# Patient Record
Sex: Female | Born: 1990 | Race: Black or African American | Hispanic: No | Smoking: Current every day smoker
Health system: Southern US, Community
[De-identification: ages and names within clinical notes are randomized; demographics above are authoritative.]

## PROBLEM LIST (undated history)

## (undated) ENCOUNTER — Inpatient Hospital Stay (HOSPITAL_COMMUNITY): Payer: Self-pay

## (undated) DIAGNOSIS — G629 Polyneuropathy, unspecified: Secondary | ICD-10-CM

## (undated) DIAGNOSIS — I10 Essential (primary) hypertension: Secondary | ICD-10-CM

## (undated) DIAGNOSIS — N6009 Solitary cyst of unspecified breast: Secondary | ICD-10-CM

---
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
## 2011-06-29 LAB — RAPID STREP SCREEN (MED CTR MEBANE ONLY): Streptococcus, Group A Screen (Direct): NEGATIVE

## 2011-07-18 ENCOUNTER — Emergency Department (HOSPITAL_COMMUNITY)
Admission: EM | Admit: 2011-07-18 | Discharge: 2011-07-18 | Disposition: A | Discharge status: Home / Self Care | Payer: No Typology Code available for payment source | Attending: Emergency Medicine | Admitting: Emergency Medicine

## 2011-07-18 DIAGNOSIS — T1490XA Injury, unspecified, initial encounter: Secondary | ICD-10-CM | POA: Insufficient documentation

## 2011-07-18 DIAGNOSIS — Y92410 Unspecified street and highway as the place of occurrence of the external cause: Secondary | ICD-10-CM | POA: Insufficient documentation

## 2012-07-22 ENCOUNTER — Emergency Department (HOSPITAL_COMMUNITY): Payer: Self-pay

## 2012-07-22 ENCOUNTER — Emergency Department (HOSPITAL_COMMUNITY)
Admission: EM | Admit: 2012-07-22 | Discharge: 2012-07-22 | Disposition: A | Discharge status: Home / Self Care | Payer: Self-pay | Attending: Emergency Medicine | Admitting: Emergency Medicine

## 2012-07-22 ENCOUNTER — Encounter (HOSPITAL_COMMUNITY): Payer: Self-pay | Admitting: *Deleted

## 2012-07-22 DIAGNOSIS — F17200 Nicotine dependence, unspecified, uncomplicated: Secondary | ICD-10-CM | POA: Insufficient documentation

## 2012-07-22 DIAGNOSIS — J209 Acute bronchitis, unspecified: Secondary | ICD-10-CM | POA: Insufficient documentation

## 2012-07-22 MED ORDER — GUAIFENESIN-CODEINE 100-10 MG/5ML PO SYRP: ORAL_SOLUTION | ORAL | Status: DC

## 2012-07-22 MED ORDER — AZITHROMYCIN 250 MG PO TABS: ORAL_TABLET | ORAL | Status: DC

## 2012-07-22 NOTE — ED Notes (Signed)
Pt is here with hard to breath and states she is coughing but nonproductive.  Pt states breathing is not regular and reports some chest pain

## 2012-07-22 NOTE — ED Provider Notes (Signed)
History  Scribed for Carleene Cooper III, MD, the patient was seen in room TR06C/TR06C. This chart was scribed by Candelaria Stagers. The patient's care started at 6:00 PM   CSN: [id number]  Arrival date & time 07/22/12  1514   First MD Initiated Contact with Patient 07/22/12 1757      Chief Complaint  Patient presents with  . Shortness of Breath     The history is provided by the patient. No language interpreter was used.   Kayla Richard is a 21 y.o. female who presents to the Emergency Department complaining of SOB that started about two days ago.  She is also experiencing a nonproductive cough.  Cough is worse at night.  She denies fever, vomiting, diarrhea.  Nothing seems to make the sx better or worse.  Pt smokes.  History reviewed. No pertinent past medical history.  History reviewed. No pertinent past surgical history.  No family history on file.  History  Substance Use Topics  . Smoking status: Current Every Day Smoker  . Smokeless tobacco: Not on file  . Alcohol Use: No    OB History    Grav Para Term Preterm Abortions TAB SAB Ect Mult Living                  Review of Systems  Constitutional: Negative for fever.  HENT: Negative for sore throat.   Respiratory: Positive for cough and shortness of breath.   Gastrointestinal: Negative for nausea, vomiting and diarrhea.  Genitourinary: Negative for difficulty urinating.  Skin: Negative for rash.  Neurological: Negative for syncope.  All other systems reviewed and are negative.    Allergies  Review of patient's allergies indicates no known allergies.  Home Medications  No current outpatient prescriptions on file.  BP 161/92  Pulse 84  Temp 98.4 F (36.9 C) (Oral)  Resp 20  SpO2 99%  LMP 07/20/2012  Physical Exam  Nursing note and vitals reviewed. Constitutional: She is oriented to person, place, and time. She appears well-developed and well-nourished. No distress.  HENT:  Head: Normocephalic and  atraumatic.  Eyes: EOM are normal. Pupils are equal, round, and reactive to light.  Neck: Neck supple. No thyromegaly present.  Cardiovascular: Normal rate.   Pulmonary/Chest: Effort normal. She has no wheezes. She has no rales.  Abdominal: Soft. She exhibits no distension. There is no tenderness.  Musculoskeletal: Normal range of motion. She exhibits no tenderness.  Lymphadenopathy:    She has no cervical adenopathy.  Neurological: She is alert and oriented to person, place, and time. She has normal strength. No sensory deficit. Cranial nerve deficit:  no gross defecits noted. She displays no seizure activity.  Skin: Skin is warm and dry.  Psychiatric: She has a normal mood and affect. Her behavior is normal.    ED Course  Procedures   DIAGNOSTIC STUDIES: Oxygen Saturation is 99% on room air, normal by my interpretation.    COORDINATION OF CARE:   6:04 PM Will treat for bronchitis with antibiotic and cough medicine.  Pt understands and agrees.   Labs Reviewed - No data to display Dg Chest 2 View  07/22/2012  *RADIOLOGY REPORT*  Clinical Data: Chest pain, shortness of breath  CHEST - 2 VIEW  Comparison: 05/13/2007  Findings: Lungs are clear. No pleural effusion or pneumothorax.  Cardiomediastinal silhouette is within normal limits.  Visualized osseous structures are within normal limits.  IMPRESSION: No evidence of acute cardiopulmonary disease.   Original Report Authenticated By: Charline Bills,  M.D.      1. Acute bronchitis    Rx Z-Pak, Robitussin AC, advised not to smoke.   I personally performed the services described in this documentation, which was scribed in my presence. The recorded information has been reviewed and considered.  Osvaldo Human, MD      Carleene Cooper III, MD 07/22/12 [phone]

## 2012-10-14 ENCOUNTER — Encounter (HOSPITAL_COMMUNITY): Payer: Self-pay | Admitting: *Deleted

## 2012-10-14 ENCOUNTER — Emergency Department (HOSPITAL_COMMUNITY)
Admission: EM | Admit: 2012-10-14 | Discharge: 2012-10-14 | Disposition: A | Discharge status: Home / Self Care | Payer: Self-pay | Attending: Emergency Medicine | Admitting: Emergency Medicine

## 2012-10-14 DIAGNOSIS — L0291 Cutaneous abscess, unspecified: Secondary | ICD-10-CM

## 2012-10-14 DIAGNOSIS — N61 Mastitis without abscess: Secondary | ICD-10-CM | POA: Insufficient documentation

## 2012-10-14 DIAGNOSIS — F17200 Nicotine dependence, unspecified, uncomplicated: Secondary | ICD-10-CM | POA: Insufficient documentation

## 2012-10-14 HISTORY — DX: Solitary cyst of unspecified breast: N60.09

## 2012-10-14 MED ORDER — SULFAMETHOXAZOLE-TRIMETHOPRIM 800-160 MG PO TABS: 1.0000 | ORAL_TABLET | Freq: Two times a day (BID) | ORAL | Status: AC

## 2012-10-14 MED ORDER — OXYCODONE-ACETAMINOPHEN 5-325 MG PO TABS
2.0000 | ORAL_TABLET | Freq: Once | ORAL | Status: AC
  Administered 2012-10-14: 2 via ORAL
  Filled 2012-10-14: qty 2

## 2012-10-14 MED ORDER — HYDROCODONE-ACETAMINOPHEN 5-325 MG PO TABS: 2.0000 | ORAL_TABLET | ORAL | Status: DC | PRN

## 2012-10-14 NOTE — ED Notes (Signed)
Discharge instructions reviewed. Pt verbalized understanding.  
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
## 2012-10-14 NOTE — ED Provider Notes (Signed)
History     CSN: [id number]  Arrival date & time 10/14/12  1027   First MD Initiated Contact with Patient 10/14/12 1121      Chief Complaint  Patient presents with  . Recurrent Skin Infections    (Consider location/radiation/quality/duration/timing/severity/associated sxs/prior treatment) HPI Comments: Patient is a 22 year old female who presents with an abscess for the past 2 days. The abscess is located between her breasts that gradually started and progressively worsened since the onset. Patient reports associated tenderness to the area that does not radiate. Palpation of the area makes the pain worse. Nothing makes the pain better. Patient has no tried anything for symptoms. She reports associated redness to the area. Patient reports recurrent abscesses in this area.    Past Medical History  Diagnosis Date  . Cyst of breast     History reviewed. No pertinent past surgical history.  No family history on file.  History  Substance Use Topics  . Smoking status: Current Every Day Smoker  . Smokeless tobacco: Not on file  . Alcohol Use: No    OB History    Grav Para Term Preterm Abortions TAB SAB Ect Mult Living                  Review of Systems  Skin: Positive for wound.  All other systems reviewed and are negative.    Allergies  Review of patient's allergies indicates no known allergies.  Home Medications   Current Outpatient Rx  Name  Route  Sig  Dispense  Refill  . IBUPROFEN 200 MG PO TABS   Oral   Take 600 mg by mouth every 6 (six) hours as needed. For pain           BP 141/85  Pulse 85  Temp 97.8 F (36.6 C) (Oral)  Resp 20  SpO2 99%  LMP 09/26/2012  Physical Exam  Nursing note and vitals reviewed. Constitutional: She is oriented to person, place, and time. She appears well-developed and well-nourished. No distress.  HENT:  Head: Normocephalic and atraumatic.  Eyes: Conjunctivae normal are normal.  Neck: Normal range of motion.    Cardiovascular: Normal rate and regular rhythm.  Exam reveals no gallop and no friction rub.   No murmur heard. Pulmonary/Chest: Effort normal and breath sounds normal. She has no wheezes. She has no rales. She exhibits no tenderness.  Abdominal: Soft. There is no tenderness.  Musculoskeletal: Normal range of motion.  Neurological: She is alert and oriented to person, place, and time.       Speech is goal-oriented. Moves limbs without ataxia.   Skin: Skin is warm and dry.       Fluctuant mass located between patient's breasts measuring 2cm x 2cm with surrounding erythema. Area is tender to palpation.   Psychiatric: She has a normal mood and affect. Her behavior is normal.    ED Course  Procedures (including critical care time)  INCISION AND DRAINAGE Performed by: Emilia Beck Consent: Verbal consent obtained. Risks and benefits: risks, benefits and alternatives were discussed Type: abscess  Body area: central chest  Anesthesia: none  Incision was made with a scalpel.  Local anesthetic: n/a  Anesthetic total: 0 ml  Complexity: complex Blunt dissection to break up loculations  Drainage: purulent  Drainage amount: 10 cc Packing material: none Patient tolerance: Patient tolerated the procedure well with no immediate complications.     Labs Reviewed - No data to display No results found.   1. Abscess  MDM  12:55 PM Abscess drained without complication. Patient will have percocet for pain and bactrim. No further evaluation needed at this time. Patient instructed to return with worsening or concerning symptoms.         Emilia Beck, PA-C 10/15/12 1558

## 2012-10-16 NOTE — ED Provider Notes (Signed)
Medical screening examination/treatment/procedure(s) were performed by non-physician practitioner and as supervising physician I was immediately available for consultation/collaboration.  Doug Sou, MD 10/16/12 [phone]

## 2013-09-17 DIAGNOSIS — O42013 Preterm premature rupture of membranes, onset of labor within 24 hours of rupture, third trimester: Secondary | ICD-10-CM

## 2013-12-28 ENCOUNTER — Encounter (HOSPITAL_COMMUNITY): Payer: Self-pay

## 2013-12-28 ENCOUNTER — Inpatient Hospital Stay (HOSPITAL_COMMUNITY)
Admission: AD | Admit: 2013-12-28 | Discharge: 2013-12-28 | Disposition: A | Discharge status: Home / Self Care | Payer: Medicaid Other | Source: Ambulatory Visit | Attending: Obstetrics & Gynecology | Admitting: Obstetrics & Gynecology

## 2013-12-28 DIAGNOSIS — O09219 Supervision of pregnancy with history of pre-term labor, unspecified trimester: Secondary | ICD-10-CM

## 2013-12-28 DIAGNOSIS — A5901 Trichomonal vulvovaginitis: Secondary | ICD-10-CM | POA: Insufficient documentation

## 2013-12-28 DIAGNOSIS — Z87891 Personal history of nicotine dependence: Secondary | ICD-10-CM | POA: Insufficient documentation

## 2013-12-28 DIAGNOSIS — O2390 Unspecified genitourinary tract infection in pregnancy, unspecified trimester: Secondary | ICD-10-CM

## 2013-12-28 DIAGNOSIS — O98819 Other maternal infectious and parasitic diseases complicating pregnancy, unspecified trimester: Secondary | ICD-10-CM | POA: Insufficient documentation

## 2013-12-28 DIAGNOSIS — O26859 Spotting complicating pregnancy, unspecified trimester: Secondary | ICD-10-CM | POA: Insufficient documentation

## 2013-12-28 DIAGNOSIS — O209 Hemorrhage in early pregnancy, unspecified: Secondary | ICD-10-CM

## 2013-12-28 DIAGNOSIS — O23599 Infection of other part of genital tract in pregnancy, unspecified trimester: Secondary | ICD-10-CM

## 2013-12-28 DIAGNOSIS — Z202 Contact with and (suspected) exposure to infections with a predominantly sexual mode of transmission: Secondary | ICD-10-CM

## 2013-12-28 DIAGNOSIS — D72829 Elevated white blood cell count, unspecified: Secondary | ICD-10-CM | POA: Insufficient documentation

## 2013-12-28 LAB — CBC
HEMATOCRIT: 36.7 % (ref 36.0–46.0)
HEMOGLOBIN: 12.8 g/dL (ref 12.0–15.0)
MCH: 30.3 pg (ref 26.0–34.0)
MCHC: 34.9 g/dL (ref 30.0–36.0)
MCV: 87 fL (ref 78.0–100.0)
Platelets: 202 10*3/uL (ref 150–400)
RBC: 4.22 MIL/uL (ref 3.87–5.11)
RDW: 13.1 % (ref 11.5–15.5)
WBC: 18.4 10*3/uL — ABNORMAL HIGH (ref 4.0–10.5)

## 2013-12-28 LAB — URINALYSIS, ROUTINE W REFLEX MICROSCOPIC
Bilirubin Urine: NEGATIVE
Glucose, UA: NEGATIVE mg/dL
Ketones, ur: NEGATIVE mg/dL
Nitrite: NEGATIVE
Protein, ur: NEGATIVE mg/dL
Specific Gravity, Urine: 1.02 (ref 1.005–1.030)
Urobilinogen, UA: 0.2 mg/dL (ref 0.0–1.0)
pH: 6.5 (ref 5.0–8.0)

## 2013-12-28 LAB — WET PREP, GENITAL
Clue Cells Wet Prep HPF POC: NONE SEEN
Yeast Wet Prep HPF POC: NONE SEEN

## 2013-12-28 LAB — URINE MICROSCOPIC-ADD ON

## 2013-12-28 LAB — ABO/RH: ABO/RH(D): B POS

## 2013-12-28 LAB — POCT PREGNANCY, URINE: Preg Test, Ur: POSITIVE — AB

## 2013-12-28 MED ORDER — PRENATAL MULTIVITAMIN CH: 1.0000 | ORAL_TABLET | Freq: Every day | ORAL | Status: DC

## 2013-12-28 MED ORDER — AZITHROMYCIN 250 MG PO TABS
1000.0000 mg | ORAL_TABLET | ORAL | Status: AC
  Administered 2013-12-28: 1000 mg via ORAL
  Filled 2013-12-28: qty 4

## 2013-12-28 MED ORDER — METRONIDAZOLE 500 MG PO TABS
2000.0000 mg | ORAL_TABLET | ORAL | Status: AC
  Administered 2013-12-28: 2000 mg via ORAL
  Filled 2013-12-28: qty 4

## 2013-12-28 MED ORDER — CEFTRIAXONE SODIUM 250 MG IJ SOLR
250.0000 mg | INTRAMUSCULAR | Status: AC
  Administered 2013-12-28: 250 mg via INTRAMUSCULAR
  Filled 2013-12-28: qty 250

## 2013-12-28 NOTE — MAU Provider Note (Signed)
Chief Complaint: Possible Pregnancy, Vaginal Bleeding and Abdominal Cramping   First Provider Initiated Contact with Patient 12/28/13 1626     SUBJECTIVE HPI: Kayla Richard is a 23 y.o. G1P0 at 6416w3d by LMP who presents to maternity admissions reporting vaginal spotting starting yesterday.  She reports some similar light spotting 3 weeks ago for which [place]  She reports everything was normal at that time.  She also reports a large amount of vaginal discharge but denies itching or burning.  She denies urinary symptoms, h/a, dizziness, n/v, or fever/chills.     Past Medical History  Diagnosis Date  . Cyst of breast    History reviewed. No pertinent past surgical history. History   Social History  . Marital Status: Single    Spouse Name: N/A    Number of Children: N/A  . Years of Education: N/A   Occupational History  . Not on file.   Social History Main Topics  . Smoking status: Former Games developermoker  . Smokeless tobacco: Not on file  . Alcohol Use: No  . Drug Use: No  . Sexual Activity: Yes   Other Topics Concern  . Not on file   Social History Narrative  . No narrative on file   No current facility-administered medications on file prior to encounter.   No current outpatient prescriptions on file prior to encounter.   No Known Allergies  ROS: Pertinent items in HPI  OBJECTIVE Blood pressure 140/82, pulse 82, temperature 98.9 F (37.2 C), temperature source Oral, resp. rate 20, height 4' 10.5" (1.486 m), weight 99.791 kg (220 lb), last menstrual period 10/16/2013, SpO2 99.00%. GENERAL: Well-developed, well-nourished female in no acute distress.  HEENT: Normocephalic HEART: normal rate RESP: normal effort ABDOMEN: Soft, non-tender EXTREMITIES: Nontender, no edema NEURO: Alert and oriented Pelvic exam: Cervix pink, visually closed, without lesion, large amount yellow frothy discharge, no blood noted, vaginal walls and external genitalia  normal Bimanual exam: Cervix 0/long/high, firm, anterior, neg CMT, uterus nontender, nonenlarged, adnexa without tenderness, enlargement, or mass, no blood on glove following exam  FHT 173 by doppler  LAB RESULTS Results for orders placed during the hospital encounter of 12/28/13 (from the past 24 hour(s))  URINALYSIS, ROUTINE W REFLEX MICROSCOPIC     Status: Abnormal   Collection Time    12/28/13  1:55 PM      Result Value Ref Range   Color, Urine YELLOW  YELLOW   APPearance CLEAR  CLEAR   Specific Gravity, Urine 1.020  1.005 - 1.030   pH 6.5  5.0 - 8.0   Glucose, UA NEGATIVE  NEGATIVE mg/dL   Hgb urine dipstick LARGE (*) NEGATIVE   Bilirubin Urine NEGATIVE  NEGATIVE   Ketones, ur NEGATIVE  NEGATIVE mg/dL   Protein, ur NEGATIVE  NEGATIVE mg/dL   Urobilinogen, UA 0.2  0.0 - 1.0 mg/dL   Nitrite NEGATIVE  NEGATIVE   Leukocytes, UA SMALL (*) NEGATIVE  URINE MICROSCOPIC-ADD ON     Status: Abnormal   Collection Time    12/28/13  1:55 PM      Result Value Ref Range   Squamous Epithelial / LPF MANY (*) RARE   WBC, UA 3-6  <3 WBC/hpf   RBC / HPF 7-10  <3 RBC/hpf   Bacteria, UA MANY (*) RARE   Urine-Other TRICHOMONAS PRESENT    POCT PREGNANCY, URINE     Status: Abnormal   Collection Time    12/28/13  2:31 PM  Result Value Ref Range   Preg Test, Ur POSITIVE (*) NEGATIVE  WET PREP, GENITAL     Status: Abnormal   Collection Time    12/28/13  4:39 PM      Result Value Ref Range   Yeast Wet Prep HPF POC NONE SEEN  NONE SEEN   Trich, Wet Prep FEW (*) NONE SEEN   Clue Cells Wet Prep HPF POC NONE SEEN  NONE SEEN   WBC, Wet Prep HPF POC MANY (*) NONE SEEN  CBC     Status: Abnormal   Collection Time    12/28/13  4:45 PM      Result Value Ref Range   WBC 18.4 (*) 4.0 - 10.5 K/uL   RBC 4.22  3.87 - 5.11 MIL/uL   Hemoglobin 12.8  12.0 - 15.0 g/dL   HCT 16.136.7  09.636.0 - 04.546.0 %   MCV 87.0  78.0 - 100.0 fL   MCH 30.3  26.0 - 34.0 pg   MCHC 34.9  30.0 - 36.0 g/dL   RDW 40.913.1  81.111.5 - 91.415.5  %   Platelets 202  150 - 400 K/uL  ABO/RH     Status: None   Collection Time    12/28/13  4:45 PM      Result Value Ref Range   ABO/RH(D) B POS       ASSESSMENT 1. Trichomonal vaginitis in pregnancy   2. Exposure to STD   3. Elevated white blood cell count   4. Vaginal bleeding in pregnant patient at less than [redacted] weeks gestation     PLAN Treatment for trichomonas with Flagyl 2000 mg PO x1 dose Treatment for PID/known exposure to STD and elevated WBC count with Rocephin 250 mg IM and azithromycin 1000 mg x1 dose Discharge home Urine sent for culture Recommend partner treatment at health department or by primary care and no intercourse x2 weeks following treatment F/U with early prenatal care Pregnancy verification letter given Return to MAU as needed for emergencies    Medication List    STOP taking these medications       ibuprofen 200 MG tablet  Commonly known as:  ADVIL,MOTRIN      TAKE these medications       acetaminophen 500 MG tablet  Commonly known as:  TYLENOL  Take 500 mg by mouth every 6 (six) hours as needed for headache.     prenatal multivitamin Tabs tablet  Take 1 tablet by mouth daily at 12 noon.       Follow-up Information   Schedule an appointment as soon as possible for [facility] [place]. (Or prenatal provider of your choice.  Return to MAU as needed for emergencies.)    Contact information:   [address] [place] [id number] [phone]      Sharen CounterLisa Leftwich-Kirby Certified Nurse-Midwife 12/28/2013  5:30 PM

## 2013-12-28 NOTE — Discharge Instructions (Signed)
Trichomoniasis Trichomoniasis is an infection, caused by the Trichomonas organism, that affects both women and men. In women, the outer female genitalia and the vagina are affected. In men, the penis is mainly affected, but the prostate and other reproductive organs can also be involved. Trichomoniasis is a sexually transmitted disease (STD) and is most often passed to another person through sexual contact. The majority of people who get trichomoniasis do so from a sexual encounter and are also at risk for other STDs. CAUSES   Sexual intercourse with an infected partner.  It can be present in swimming pools or hot tubs. SYMPTOMS   Abnormal gray-green frothy vaginal discharge in women.  Vaginal itching and irritation in women.  Itching and irritation of the area outside the vagina in women.  Penile discharge with or without pain in males.  Inflammation of the urethra (urethritis), causing painful urination.  Bleeding after sexual intercourse. RELATED COMPLICATIONS  Pelvic inflammatory disease.  Infection of the uterus (endometritis).  Infertility.  Tubal (ectopic) pregnancy.  It can be associated with other STDs, including gonorrhea and chlamydia, hepatitis B, and HIV. COMPLICATIONS DURING PREGNANCY  Early (premature) delivery.  Premature rupture of the membranes (PROM).  Low birth weight. DIAGNOSIS   Visualization of Trichomonas under the microscope from the vagina discharge.  Ph of the vagina greater than 4.5, tested with a test tape.  Trich Rapid Test.  Culture of the organism, but this is not usually needed.  It may be found on a Pap test.  Having a "strawberry cervix,"which means the cervix looks very red like a strawberry. TREATMENT   You may be given medication to fight the infection. Inform your caregiver if you could be or are pregnant. Some medications used to treat the infection should not be taken during pregnancy.  Over-the-counter medications or  creams to decrease itching or irritation may be recommended.  Your sexual partner will need to be treated if infected. HOME CARE INSTRUCTIONS   Take all medication prescribed by your caregiver.  Take over-the-counter medication for itching or irritation as directed by your caregiver.  Do not have sexual intercourse while you have the infection.  Do not douche or wear tampons.  Discuss your infection with your partner, as your partner may have acquired the infection from you. Or, your partner may have been the person who transmitted the infection to you.  Have your sex partner examined and treated if necessary.  Practice safe, informed, and protected sex.  See your caregiver for other STD testing. SEEK MEDICAL CARE IF:   You still have symptoms after you finish the medication.  You have an oral temperature above 102 F (38.9 C).  You develop belly (abdominal) pain.  You have pain when you urinate.  You have bleeding after sexual intercourse.  You develop a rash.  The medication makes you sick or makes you throw up (vomit). Document Released: 02/27/2001 Document Revised: 11/26/2011 Document Reviewed: 03/25/2009 [facility] Patient Information 2014 [place] [place]. Vaginal Bleeding During Pregnancy, First Trimester A small amount of bleeding (spotting) from the vagina is relatively common in early pregnancy. It usually stops on its own. Various things may cause bleeding or spotting in early pregnancy. Some bleeding may be related to the pregnancy, and some may not. In most cases, the bleeding is normal and is not a problem. However, bleeding can also be a sign of something serious. Be sure to tell your health care provider about any vaginal bleeding right away. Some possible causes of vaginal bleeding  during the first trimester include:  Infection or inflammation of the cervix.  Growths (polyps) on the cervix.  Miscarriage or threatened miscarriage.  Pregnancy tissue has  developed outside of the uterus and in a fallopian tube (tubal pregnancy).  Tiny cysts have developed in the uterus instead of pregnancy tissue (molar pregnancy). HOME CARE INSTRUCTIONS  Watch your condition for any changes. The following actions may help to lessen any discomfort you are feeling:  Follow your health care provider's instructions for limiting your activity. If your health care provider orders bed rest, you may need to stay in bed and only get up to use the bathroom. However, your health care provider may allow you to continue light activity.  If needed, make plans for someone to help with your regular activities and responsibilities while you are on bed rest.  Keep track of the number of pads you use each day, how often you change pads, and how soaked (saturated) they are. Write this down.  Do not use tampons. Do not douche.  Do not have sexual intercourse or orgasms until approved by your health care provider.  If you pass any tissue from your vagina, save the tissue so you can show it to your health care provider.  Only take over-the-counter or prescription medicines as directed by your health care provider.  Do not take aspirin because it can make you bleed.  Keep all follow-up appointments as directed by your health care provider. SEEK MEDICAL CARE IF:  You have any vaginal bleeding during any part of your pregnancy.  You have cramps or labor pains. SEEK IMMEDIATE MEDICAL CARE IF:   You have severe cramps in your back or belly (abdomen).  You have a fever, not controlled by medicine.  You pass large clots or tissue from your vagina.  Your bleeding increases.  You feel lightheaded or weak, or you have fainting episodes.  You have chills.  You are leaking fluid or have a gush of fluid from your vagina.  You pass out while having a bowel movement. MAKE SURE YOU:  Understand these instructions.  Will watch your condition.  Will get help right away if  you are not doing well or get worse. Document Released: 06/13/2005 Document Revised: 06/24/2013 Document Reviewed: 05/11/2013 [facility] Patient Information 2014 [place], [place].

## 2013-12-28 NOTE — MAU Note (Signed)
Patient states [place]. Had an ultrasound for bleeding but was not told a reason.  States she had vaginal bleeding with wiping this am.States she has some abdominal cramping on and off. Denies nausea, vomiting or diarrhea.

## 2013-12-28 NOTE — MAU Provider Note (Signed)
Attestation of Attending Supervision of Advanced Practitioner (CNM/NP): Evaluation and management procedures were performed by the Advanced Practitioner under my supervision and collaboration.  I have reviewed the Advanced Practitioner's note and chart, and I agree with the management and plan.  Eber JonesCarolyn Harraway-Smith 6:40 PM

## 2013-12-29 LAB — GC/CHLAMYDIA PROBE AMP
CT PROBE, AMP APTIMA: NEGATIVE
GC Probe RNA: NEGATIVE

## 2013-12-30 LAB — URINE CULTURE

## 2014-01-02 ENCOUNTER — Telehealth (HOSPITAL_COMMUNITY): Payer: Self-pay | Admitting: Obstetrics and Gynecology

## 2014-01-02 MED ORDER — AMOXICILLIN 500 MG PO CAPS: 500.0000 mg | ORAL_CAPSULE | Freq: Three times a day (TID) | ORAL | Status: DC

## 2014-01-02 NOTE — Telephone Encounter (Signed)
+   GBS in urine. RX sent to pharmacy. Pt made aware.

## 2014-07-19 ENCOUNTER — Encounter (HOSPITAL_COMMUNITY): Payer: Self-pay

## 2014-09-17 NOTE — L&D Delivery Note (Signed)
Delivery Note At 6:29 PM a non-viable female was delivered via Vaginal, Spontaneous Delivery by nurse.  Infant with notable heart rate, but no other signs of life.  APGAR: 2, 2; weight 8.6 oz (244 g).  Infant placed in clean, dry blanket and given to patient.  Placenta allowed to delivery, without intervention, and spontaneously expelled, per nurse report, at 55.  Placenta was not inspected, by this provider, but was sent to pathology.     Anesthesia: None  Episiotomy: None Lacerations: None Suture Repair: None Est. Blood Loss (mL): 200  Mom to AICU.  Baby to with mother, but to be sent to [facility].Marland Kitchen  Marlene Bast MSN, CNM 05/02/2015 7:09PM

## 2014-11-02 ENCOUNTER — Encounter (HOSPITAL_COMMUNITY): Payer: Self-pay | Admitting: *Deleted

## 2015-04-14 ENCOUNTER — Observation Stay (HOSPITAL_COMMUNITY)
Admission: AD | Admit: 2015-04-14 | Discharge: 2015-04-17 | Disposition: A | Discharge status: Home / Self Care | Payer: Medicaid Other | Source: Ambulatory Visit | Attending: Obstetrics and Gynecology | Admitting: Obstetrics and Gynecology

## 2015-04-14 DIAGNOSIS — O26872 Cervical shortening, second trimester: Secondary | ICD-10-CM | POA: Diagnosis not present

## 2015-04-14 DIAGNOSIS — N883 Incompetence of cervix uteri: Secondary | ICD-10-CM | POA: Diagnosis present

## 2015-04-14 DIAGNOSIS — Z87891 Personal history of nicotine dependence: Secondary | ICD-10-CM | POA: Diagnosis not present

## 2015-04-14 DIAGNOSIS — Z3A16 16 weeks gestation of pregnancy: Secondary | ICD-10-CM | POA: Diagnosis not present

## 2015-04-14 DIAGNOSIS — O09219 Supervision of pregnancy with history of pre-term labor, unspecified trimester: Secondary | ICD-10-CM

## 2015-04-14 DIAGNOSIS — O3432 Maternal care for cervical incompetence, second trimester: Secondary | ICD-10-CM | POA: Diagnosis not present

## 2015-04-14 DIAGNOSIS — O3430 Maternal care for cervical incompetence, unspecified trimester: Secondary | ICD-10-CM | POA: Diagnosis not present

## 2015-04-14 LAB — CBC
HCT: 34.7 % — ABNORMAL LOW (ref 36.0–46.0)
Hemoglobin: 11.8 g/dL — ABNORMAL LOW (ref 12.0–15.0)
MCH: 29.8 pg (ref 26.0–34.0)
MCHC: 34 g/dL (ref 30.0–36.0)
MCV: 87.6 fL (ref 78.0–100.0)
PLATELETS: 205 10*3/uL (ref 150–400)
RBC: 3.96 MIL/uL (ref 3.87–5.11)
RDW: 13.2 % (ref 11.5–15.5)
WBC: 17.5 10*3/uL — ABNORMAL HIGH (ref 4.0–10.5)

## 2015-04-14 LAB — MRSA PCR SCREENING: MRSA BY PCR: NEGATIVE

## 2015-04-14 MED ORDER — PRENATAL MULTIVITAMIN CH: 1.0000 | ORAL_TABLET | Freq: Every day | ORAL | Status: DC

## 2015-04-14 MED ORDER — SODIUM CHLORIDE 0.9 % IV SOLN: 250.0000 mL | INTRAVENOUS | Status: DC | PRN

## 2015-04-14 MED ORDER — ZOLPIDEM TARTRATE 5 MG PO TABS: 5.0000 mg | ORAL_TABLET | Freq: Every evening | ORAL | Status: DC | PRN

## 2015-04-14 MED ORDER — ACETAMINOPHEN 325 MG PO TABS: 650.0000 mg | ORAL_TABLET | ORAL | Status: DC | PRN

## 2015-04-14 MED ORDER — SODIUM CHLORIDE 0.9 % IJ SOLN
3.0000 mL | Freq: Two times a day (BID) | INTRAMUSCULAR | Status: DC
  Administered 2015-04-15 – 2015-04-16 (×3): 3 mL via INTRAVENOUS

## 2015-04-14 MED ORDER — DOCUSATE SODIUM 100 MG PO CAPS
100.0000 mg | ORAL_CAPSULE | Freq: Every day | ORAL | Status: DC
  Administered 2015-04-16: 100 mg via ORAL
  Filled 2015-04-14: qty 1

## 2015-04-14 MED ORDER — SODIUM CHLORIDE 0.9 % IJ SOLN: 3.0000 mL | INTRAMUSCULAR | Status: DC | PRN

## 2015-04-14 MED ORDER — PRENATAL MULTIVITAMIN CH
1.0000 | ORAL_TABLET | Freq: Every day | ORAL | Status: DC
  Administered 2015-04-15 – 2015-04-16 (×2): 1 via ORAL
  Filled 2015-04-14 (×2): qty 1

## 2015-04-14 MED ORDER — CALCIUM CARBONATE ANTACID 500 MG PO CHEW: 2.0000 | CHEWABLE_TABLET | ORAL | Status: DC | PRN

## 2015-04-14 MED ORDER — DOCUSATE SODIUM 100 MG PO CAPS: 100.0000 mg | ORAL_CAPSULE | Freq: Every day | ORAL | Status: DC

## 2015-04-14 NOTE — Progress Notes (Signed)
Antepartum LOS: 0 Kayla Richard, 24 y.o.,   OB History    Gravida Para Term Preterm AB TAB SAB Ectopic Multiple Living   1               Subjective -Nurse reports patient with questions and concerns regarding hospital stay.  In room to address.  Patient questions if IV is necessary, anesthesia used during procedure, and need for [place] position.  FOB at bedside.   Objective  Filed Vitals:   04/14/15 2034 04/14/15 2049 04/14/15 2100  BP:  136/70 147/60  Pulse:  103 101  Temp: 99.1 F (37.3 C)    TempSrc: Oral    Resp: 18  18  SpO2:  99% 100%    Results for orders placed or performed during the hospital encounter of 04/14/15 (from the past 24 hour(s))  MRSA PCR Screening     Status: None   Collection Time: 04/14/15  9:06 PM  Result Value Ref Range   MRSA by PCR NEGATIVE NEGATIVE  CBC     Status: Abnormal   Collection Time: 04/14/15 10:46 PM  Result Value Ref Range   WBC 17.5 (H) 4.0 - 10.5 K/uL   RBC 3.96 3.87 - 5.11 MIL/uL   Hemoglobin 11.8 (L) 12.0 - 15.0 g/dL   HCT 16.1 (L) 09.6 - 04.5 %   MCV 87.6 78.0 - 100.0 fL   MCH 29.8 26.0 - 34.0 pg   MCHC 34.0 30.0 - 36.0 g/dL   RDW 40.9 81.1 - 91.4 %   Platelets 205 150 - 400 K/uL    Meds: Scheduled Meds: . [START ON 04/15/2015] docusate sodium  100 mg Oral Daily  . [START ON 04/15/2015] prenatal multivitamin  1 tablet Oral Q1200  . sodium chloride  3 mL Intravenous Q12H   Continuous Infusions:  PRN Meds:.sodium chloride, acetaminophen, calcium carbonate, sodium chloride, zolpidem   Physical Exam:  Deferred  Monitoring Type:Doppler Qshift FHR: 147 UC: None reported  Assessment IUP at 16.3wks Short Cervix  Plan Educated on IV hydration and access in case of emergency during surgery Educated on amniotic sac and potential to protrude through cervix if in upright position due to gravity Informed that anesethesia used during procedure varies from "twilight sleep" to general, but is dependent on other  factors.  Patient encouraged to discuss with Dr. ND in am regarding surgical questions and concerns NPO after 5 am Trendelenburg Position at 15 degree angle Upcoming Treatments/Tests: [place] for CL, Cerclage  Dr.N. Dillard to follow as appropriate   Kayla Eichhorn LYNN, MSN, CNM 04/14/2015, 10:37 PM

## 2015-04-14 NOTE — H&P (Signed)
Kayla Richard is a 24 y.o. female, G3 P0 at 16.3 weeks  There are no active problems to display for this patient.   Pregnancy Course: Patient entered care at 12.4 weeks.   EDC of 09/26/15 was established by [place].      [place] evaluations:   16.3 weeks - FU: uterus length 0.26, FHR 149, breech, posterior placenta, cervix funneling, debri in the cervical canel.   Significant prenatal events:   IUFD at 18 weeks, short cervix 0.26 on 04/14/15   Last evaluation:   16.3 weeks     Reason for admission:  Short cervix  Pt States:   Contractions Frequency: none         Contraction severity: n/a         Fetal activity: n/a  OB History as of 11/02/14    Gravida Para Term Preterm AB TAB SAB Ectopic Multiple Living   1              Past Medical History  Diagnosis Date  . Cyst of breast    History reviewed. No pertinent past surgical history. Family History: family history is not on file. Social History:  reports that she has quit smoking. She does not have any smokeless tobacco history on file. She reports that she does not drink alcohol or use illicit drugs.   Prenatal Transfer Tool  Maternal Diabetes:  Genetic Screening:  Maternal Ultrasounds/Referrals:  Fetal Ultrasounds or other Referrals:   Maternal Substance Abuse:  No Significant Maternal Medications:  None Significant Maternal Lab Results: None   ROS:  See HPI above, all other systems are negative  No Known Allergies    Blood pressure 135/87, pulse 84, temperature 97.6 F (36.4 C), temperature source Oral, resp. rate 20, height 4' 10.5" (1.486 m), weight 220 lb (99.791 kg), last menstrual period 10/16/2013, SpO2 99 %, unknown if currently breastfeeding.  Maternal Exam:  Uterine Assessment: Contraction frequency is rare.  Abdomen: Gravid, non tender. Fundal height is aga.  Normal external genitalia, vulva, cervix, uterus and adnexa.  No lesions noted on exam.    Fetal Exam:  Monitor Surveillance : fetal heart tones q  shift Mode: Ultrasound.  NICHD: Category CTXs:no       Physical Exam: Nursing note and vitals reviewed General: alert and cooperative She appears well nourished Psychiatric: Normal mood and affect. Her behavior is normal Head: Normocephalic Eyes: Pupils are equal, round, and reactive to light Neck: Normal range of motion Cardiovascular: RRR without murmur  Respiratory: CTAB. Effort normal  Abd: soft, non-tender, +BS, no rebound, no guarding  Genitourinary: Vagina normal  Neurological: A&Ox3 Skin: Warm and dry  Musculoskeletal: Normal range of motion  Homan's sign negative bilaterally No evidence of DVTs.  Edema: Minimal bilaterally non-pitting edema DTR: 2+ Clonus: None   Prenatal labs: ABO, Rh:  B positive Antibody:  negative Rubella:    immune RPR:   NR HBsAg:   negative HIV:   NR GBS:   Sickle cell/Hgb electrophoresis:  WNL Pap:   GC:   negative Chlamydia: negative Genetic screenings:   Glucola:    Assessment:  IUP at 16.3 weeks Membranes: intact GBS  Diagnosis: short cervix  Plan:  Admit to Antepartum service Routing [facility] AP orders IV pain medication per orders Prophylaxis abx if necessary Diet: NPO after MN   Kayla Richard, CNM, MSN 04/14/2015, 6:08 PM

## 2015-04-15 ENCOUNTER — Encounter (HOSPITAL_COMMUNITY)
Admission: AD | Disposition: A | Discharge status: Home / Self Care | Payer: Self-pay | Source: Ambulatory Visit | Attending: Obstetrics and Gynecology

## 2015-04-15 ENCOUNTER — Encounter (HOSPITAL_COMMUNITY): Payer: Self-pay | Admitting: Obstetrics and Gynecology

## 2015-04-15 ENCOUNTER — Inpatient Hospital Stay (HOSPITAL_COMMUNITY): Payer: Medicaid Other | Admitting: Registered Nurse

## 2015-04-15 ENCOUNTER — Observation Stay (HOSPITAL_COMMUNITY): Payer: Medicaid Other

## 2015-04-15 DIAGNOSIS — O26872 Cervical shortening, second trimester: Secondary | ICD-10-CM | POA: Diagnosis not present

## 2015-04-15 DIAGNOSIS — Z87891 Personal history of nicotine dependence: Secondary | ICD-10-CM | POA: Diagnosis not present

## 2015-04-15 DIAGNOSIS — O3432 Maternal care for cervical incompetence, second trimester: Secondary | ICD-10-CM | POA: Diagnosis not present

## 2015-04-15 DIAGNOSIS — N883 Incompetence of cervix uteri: Secondary | ICD-10-CM | POA: Diagnosis present

## 2015-04-15 DIAGNOSIS — Z3A16 16 weeks gestation of pregnancy: Secondary | ICD-10-CM | POA: Diagnosis not present

## 2015-04-15 HISTORY — PX: CERVICAL CERCLAGE: SHX1329

## 2015-04-15 LAB — TYPE AND SCREEN
ABO/RH(D): B POS
ANTIBODY SCREEN: NEGATIVE

## 2015-04-15 SURGERY — CERCLAGE, CERVIX, VAGINAL APPROACH
Anesthesia: Spinal | Site: Vagina

## 2015-04-15 MED ORDER — PROMETHAZINE HCL 25 MG/ML IJ SOLN
6.2500 mg | INTRAMUSCULAR | Status: DC | PRN
Start: 2015-04-15 — End: 2015-04-16

## 2015-04-15 MED ORDER — SODIUM CHLORIDE 0.9 % IJ SOLN
Freq: Once | INTRAMUSCULAR | Status: DC
  Filled 2015-04-15: qty 1

## 2015-04-15 MED ORDER — SODIUM CHLORIDE 0.9 % IJ SOLN
INTRAMUSCULAR | Status: DC | PRN
  Administered 2015-04-15: 30 mL via VAGINAL

## 2015-04-15 MED ORDER — LACTATED RINGERS IV SOLN
INTRAVENOUS | Status: DC | PRN
  Administered 2015-04-15: 12:00:00 via INTRAVENOUS

## 2015-04-15 MED ORDER — FENTANYL CITRATE (PF) 100 MCG/2ML IJ SOLN: 25.0000 ug | INTRAMUSCULAR | Status: DC | PRN

## 2015-04-15 MED ORDER — ONDANSETRON HCL 4 MG/2ML IJ SOLN
INTRAMUSCULAR | Status: AC
  Filled 2015-04-15: qty 2

## 2015-04-15 MED ORDER — ONDANSETRON HCL 4 MG/2ML IJ SOLN
INTRAMUSCULAR | Status: DC | PRN
  Administered 2015-04-15: 4 mg via INTRAVENOUS

## 2015-04-15 MED ORDER — INDOMETHACIN 25 MG PO CAPS
25.0000 mg | ORAL_CAPSULE | Freq: Three times a day (TID) | ORAL | Status: AC
  Administered 2015-04-15 – 2015-04-17 (×6): 25 mg via ORAL
  Filled 2015-04-15 (×6): qty 1

## 2015-04-15 MED ORDER — MEPERIDINE HCL 25 MG/ML IJ SOLN: 6.2500 mg | INTRAMUSCULAR | Status: DC | PRN

## 2015-04-15 MED ORDER — FENTANYL CITRATE (PF) 100 MCG/2ML IJ SOLN
INTRAMUSCULAR | Status: DC | PRN
  Administered 2015-04-15 (×2): 50 ug via INTRAVENOUS

## 2015-04-15 MED ORDER — PHENYLEPHRINE 40 MCG/ML (10ML) SYRINGE FOR IV PUSH (FOR BLOOD PRESSURE SUPPORT)
PREFILLED_SYRINGE | INTRAVENOUS | Status: AC
  Filled 2015-04-15: qty 10

## 2015-04-15 MED ORDER — FENTANYL CITRATE (PF) 100 MCG/2ML IJ SOLN
INTRAMUSCULAR | Status: AC
  Filled 2015-04-15: qty 2

## 2015-04-15 MED ORDER — EPHEDRINE 5 MG/ML INJ
INTRAVENOUS | Status: AC
  Filled 2015-04-15: qty 10

## 2015-04-15 MED ORDER — DEXAMETHASONE SODIUM PHOSPHATE 4 MG/ML IJ SOLN
INTRAMUSCULAR | Status: AC
  Filled 2015-04-15: qty 1

## 2015-04-15 SURGICAL SUPPLY — 19 items
CLOTH BEACON ORANGE TIMEOUT ST (SAFETY) ×3 IMPLANT
COUNTER NEEDLE 1200 MAGNETIC (NEEDLE) IMPLANT
GLOVE BIO SURGEON STRL SZ 6.5 (GLOVE) ×2 IMPLANT
GLOVE BIO SURGEONS STRL SZ 6.5 (GLOVE) ×1
GLOVE BIOGEL PI IND STRL 7.0 (GLOVE) ×1 IMPLANT
GLOVE BIOGEL PI INDICATOR 7.0 (GLOVE) ×2
GOWN STRL REUS W/TWL LRG LVL3 (GOWN DISPOSABLE) ×6 IMPLANT
PACK VAGINAL MINOR WOMEN LF (CUSTOM PROCEDURE TRAY) ×3 IMPLANT
PAD OB MATERNITY 4.3X12.25 (PERSONAL CARE ITEMS) ×3 IMPLANT
PAD PREP 24X48 CUFFED NSTRL (MISCELLANEOUS) ×3 IMPLANT
SCOPETTES 8  STERILE (MISCELLANEOUS) ×4
SCOPETTES 8 STERILE (MISCELLANEOUS) ×2 IMPLANT
SUT MERSILENE 5MM BP 1 12 (SUTURE) ×6 IMPLANT
TOWEL OR 17X24 6PK STRL BLUE (TOWEL DISPOSABLE) ×6 IMPLANT
TRAY FOLEY CATH SILVER 14FR (SET/KITS/TRAYS/PACK) ×3 IMPLANT
TUBING NON-CON 1/4 X 20 CONN (TUBING) ×2 IMPLANT
TUBING NON-CON 1/4 X 20' CONN (TUBING) ×1
WATER STERILE IRR 1000ML POUR (IV SOLUTION) ×3 IMPLANT
YANKAUER SUCT BULB TIP NO VENT (SUCTIONS) ×3 IMPLANT

## 2015-04-15 NOTE — H&P (Signed)
Kayla Richard is a 24 y.o. female, G3 P0 at 16.3 weeks  There are no active problems to display for this patient.   Pregnancy Course: Patient entered care at 12.4 weeks.   EDC of 09/26/15 was established by [place].      US evaluations:   16.3 weeks - FU: uterus length 0.26, FHR 149, breech, posterior placenta, cervix funneling, debri in the cervical canel.   Significant prenatal events:   IUFD at 18 weeks, short cervix 0.26 on 04/14/15   Last evaluation:   16.3 weeks     Reason for admission:  Short cervix  Pt States:   Contractions Frequency: none         Contraction severity: n/a         Fetal activity: n/a  OB History as of 11/02/14    Gravida Para Term Preterm AB TAB SAB Ectopic Multiple Living   1              Past Medical History  Diagnosis Date  . Cyst of breast    History reviewed. No pertinent past surgical history. Family History: family history is not on file. Social History:  reports that she has quit smoking. She does not have any smokeless tobacco history on file. She reports that she does not drink alcohol or use illicit drugs.   Prenatal Transfer Tool  Maternal Diabetes:  Genetic Screening:  Maternal Ultrasounds/Referrals:  Fetal Ultrasounds or other Referrals:   Maternal Substance Abuse:  No Significant Maternal Medications:  None Significant Maternal Lab Results: None   ROS:  See HPI above, all other systems are negative  No Known Allergies    Blood pressure 135/87, pulse 84, temperature 97.6 F (36.4 C), temperature source Oral, resp. rate 20, height 4' 10.5" (1.486 m), weight 220 lb (99.791 kg), last menstrual period 10/16/2013, SpO2 99 %, unknown if currently breastfeeding.  Maternal Exam:  Uterine Assessment: Contraction frequency is rare.  Abdomen: Gravid, non tender. Fundal height is aga.  Normal external genitalia, vulva, cervix, uterus and adnexa.  No lesions noted on exam.    Fetal Exam:  Monitor Surveillance : fetal heart tones q  shift Mode: Ultrasound.  NICHD: Category CTXs:no       Physical Exam: Nursing note and vitals reviewed General: alert and cooperative She appears well nourished Psychiatric: Normal mood and affect. Her behavior is normal Head: Normocephalic Eyes: Pupils are equal, round, and reactive to light Neck: Normal range of motion Cardiovascular: RRR without murmur  Respiratory: CTAB. Effort normal  Abd: soft, non-tender, +BS, no rebound, no guarding  Genitourinary: Vagina normal  Neurological: A&Ox3 Skin: Warm and dry  Musculoskeletal: Normal range of motion  Homan's sign negative bilaterally No evidence of DVTs.  Edema: Minimal bilaterally non-pitting edema DTR: 2+ Clonus: None   Prenatal labs: ABO, Rh:  B positive Antibody:  negative Rubella:    immune RPR:   NR HBsAg:   negative HIV:   NR GBS:   Sickle cell/Hgb electrophoresis:  WNL Pap:   GC:   negative Chlamydia: negative Genetic screenings:   Glucola:    Assessment:  IUP at 16.3 weeks Membranes: intact GBS  Diagnosis: short cervix  Plan:  Admit to Antepartum service Routing [facility] AP orders IV pain medication per orders Prophylaxis abx if necessary Diet: NPO after MN   Raye Wiens, CNM, MSN 04/14/2015, 6:08 PM 

## 2015-04-15 NOTE — Progress Notes (Addendum)
Chaplain visited in response to a Spiritual Care Referral. Kayla Richard is understandably anxious given her current medical condition. She is not looking forward to the long hospital stay, but is determined to see it through in order to "save the baby." Her husband was present sleeping in the room at the time of the visit. Kayla Swords reports he is there to assist in her care. She reports she is a member of a community of faith, but is not sure how much that community will assist her during this hospital stay. Prayer was requested and given.  Please page the chaplain should Kayla Grefe emotions or spirit wear thin in the coming weeks. Recommend periodic daytime, evening and/or weekend spiritual care visits to further assess the spiritual well-being of both Kayla  Mcgough and her husband.  Page chaplain if Kayla Taaffe desires or needs Spiritual Care.  Benjie Karvonen. Lane Eland, DMin, MDiv, MA Chaplain

## 2015-04-15 NOTE — Progress Notes (Addendum)
Hospital day # 1 pregnancy at 57 3/7 weeks--Cervix 0.26 with funneling on [place] in the office yesterday.  S:  In Trendelenbug--FOB asleep at bedside.      Perception of contractions: None      Vaginal bleeding: None      Vaginal discharge:  None  Has not had [place] yet today.  O: BP 125/66 mmHg  Pulse 77  Temp(Src) 98.4 F (36.9 C) (Oral)  Resp 18  Ht 5' (1.524 m)  Wt 92.08 kg (203 lb)  BMI 39.65 kg/m2  SpO2 99%      FHR 143 at 0830      Contractions:   None      Uterus non-tender      Extremities: no significant edema and no signs of DVT.            Labs:   Results for orders placed or performed during the hospital encounter of 04/14/15 (from the past 24 hour(s))  MRSA PCR Screening     Status: None   Collection Time: 04/14/15  9:06 PM  Result Value Ref Range   MRSA by PCR NEGATIVE NEGATIVE  Type and screen     Status: None   Collection Time: 04/14/15 10:46 PM  Result Value Ref Range   ABO/RH(D) B POS    Antibody Screen NEG    Sample Expiration 04/17/2015   CBC     Status: Abnormal   Collection Time: 04/14/15 10:46 PM  Result Value Ref Range   WBC 17.5 (H) 4.0 - 10.5 K/uL   RBC 3.96 3.87 - 5.11 MIL/uL   Hemoglobin 11.8 (L) 12.0 - 15.0 g/dL   HCT 16.1 (L) 09.6 - 04.5 %   MCV 87.6 78.0 - 100.0 fL   MCH 29.8 26.0 - 34.0 pg   MCHC 34.0 30.0 - 36.0 g/dL   RDW 40.9 81.1 - 91.4 %   Platelets 205 150 - 400 K/uL         Meds:  . docusate sodium  100 mg Oral Daily  . prenatal multivitamin  1 tablet Oral Q1200  . 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      Reviewed cerclage procedure, patient likely to remain overnight post-op.      Support to patient for concerns regarding pregnancy.      MDs will follow--Dr. Normand Sloop in, plan to proceed with cerclage without repeat US.  Nigel Bridgeman CNM,  [place] 04/15/2015 11:19 AM   Date of Initial H&P: 7/28  History reviewed, patient examined, no change in status, stable for surgery. Pt understands the risks are but not limited to bleeding, infection, AROM, loss of the pregnancy.   I also recommend 17 P.  Pt agrees with the plan

## 2015-04-15 NOTE — Anesthesia Procedure Notes (Signed)
Spinal Patient location during procedure: OR Start time: 04/15/2015 12:01 PM End time: 04/15/2015 12:14 PM Staffing Anesthesiologist: Sebastian Ache Preanesthetic Checklist Completed: patient identified, site marked, surgical consent, pre-op evaluation, timeout performed, IV checked, risks and benefits discussed and monitors and equipment checked Spinal Block Patient position: right lateral decubitus Prep: site prepped and draped and DuraPrep Patient monitoring: heart rate, continuous pulse ox and blood pressure Approach: midline Location: L2-3 Injection technique: single-shot Needle Needle type: Pencil-Tip  Needle gauge: 24 G Needle length: 9 cm Needle insertion depth: 6 cm Assessment Sensory level: T8 Additional Notes spional with 24g 1cc .75% marcaine with dextrose, no complications

## 2015-04-15 NOTE — Transfer of Care (Signed)
Immediate Anesthesia Transfer of Care Note  Patient: Kayla Richard  Procedure(s) Performed: Procedure(s): CERCLAGE CERVICAL (N/A)  Patient Location: PACU  Anesthesia Type:Spinal  Level of Consciousness: awake, alert  and oriented  Airway & Oxygen Therapy: Patient Spontanous Breathing  Post-op Assessment: Report given to RN  Post vital signs: Reviewed  Last Vitals:  Filed Vitals:   04/15/15 0901  BP:   Pulse:   Temp: 36.9 C  Resp:     Complications: No apparent anesthesia complications

## 2015-04-15 NOTE — Progress Notes (Signed)
24 year old, G2P0, at 16 weeks 3 days gestation one hour s/p cervical cerclage when consult placed.  Noted to have a .26 cm cervical length on ultrasound yesterday and hospitalized yesterday in anticipation of surgery. 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   Past Medical History  Diagnosis Date  . Cyst of breast    History reviewed. No pertinent past surgical history.  History reviewed. No pertinent family history.  History   Social History  . Marital Status: Single    Spouse Name: N/A  . Number of Children: N/A  . Years of Education: N/A   Occupational History  . Not on file.   Social History Main Topics  . Smoking status: Former Games developer  . Smokeless tobacco: Not on file  . Alcohol Use: No  . Drug Use: No  . Sexual Activity: Yes   Other Topics Concern  . Not on file   Social History Narrative    BP 127/67 mmHg  Pulse 76  Temp(Src) 97.9 F (36.6 C) (Oral)  Resp 21  Ht 5' (1.524 m)  Wt 203 lb (92.08 kg)  BMI 39.65 kg/m2  SpO2 100%  Currently doing well postoperatively although spinal  anesthesia has not worn off yet. Currently in trendelenburg position  Serial ultrasounds for cervical length weekly for now postoperatively Consider short course of indomethacin for ~24-48 hours postoperatively Advance activity and diet as tolerated. Follow for clinical signs of infection  Questions appear answered to the couples satisfaction. Precautions for the above given.

## 2015-04-15 NOTE — Anesthesia Preprocedure Evaluation (Signed)
Anesthesia Evaluation  Patient identified by MRN, date of birth, ID band Patient awake    Reviewed: Allergy & Precautions, NPO status , Patient's Chart, lab work & pertinent test results, reviewed documented beta blocker date and time   Airway Mallampati: II   Neck ROM: Full    Dental  (+) Teeth Intact, Dental Advisory Given   Pulmonary former smoker,  breath sounds clear to auscultation        Cardiovascular negative cardio ROS  Rhythm:Regular     Neuro/Psych    GI/Hepatic negative GI ROS, Neg liver ROS,   Endo/Other  negative endocrine ROS  Renal/GU negative Renal ROS     Musculoskeletal   Abdominal (+)  Abdomen: soft.    Peds  Hematology 12/35 plts 205   Anesthesia Other Findings   Reproductive/Obstetrics                             Anesthesia Physical Anesthesia Plan  ASA: I  Anesthesia Plan: Spinal   Post-op Pain Management:    Induction:   Airway Management Planned:   Additional Equipment:   Intra-op Plan:   Post-operative Plan:   Informed Consent: I have reviewed the patients History and Physical, chart, labs and discussed the procedure including the risks, benefits and alternatives for the proposed anesthesia with the patient or authorized representative who has indicated his/her understanding and acceptance.     Plan Discussed with:   Anesthesia Plan Comments:         Anesthesia [place] Evaluation

## 2015-04-15 NOTE — Op Note (Signed)
Pre OP DX: Incompetent cervix history of loss of 18 weeks. Shortened cervix at 16 weeks currently. Postop diagnosis: Same Procedure: emergency cerclage Surgeon: Dr. Normand Sloop Anesthesia: Spinal Estimated blood loss: Minimal Competitions: None Procedure in  Detail:   Patient was taken to the operating room, spinal anesthesia and prepped and draped in normal sterile fashion. Foley catheter was placed. Weighted speculum was placed into the vagina. The posterior lip of the cervix was grasped with ring forceps. This was short probably about a centimeter long and very soft fingertip dilated. No bulging membranes were seen. Mersilene suture was used on needle to do a cervical McDonald cerclage without difficulty. Cerclage was tied and the cervix was closed. clindamycin douche was placed in the vagina.  She was removed from the vagina sponge lap and needle counts were correct patient went to recovery room in stable condition this is Dr. Normand Sloop dictating thank you very much.

## 2015-04-15 NOTE — Progress Notes (Signed)
UR chart review completed.  

## 2015-04-15 NOTE — Progress Notes (Signed)
RN spoke to Dr. Normand Sloop about patient being able to sit up to eat.  Dr. Normand Sloop gave order for patient to stay in Trendelenburg 15 degrees; however, patient may sit up to 45 degrees while eating and may have bathroom privileges.

## 2015-04-15 NOTE — Progress Notes (Addendum)
Chaplain afternoon follow-up visit found Ms Kayla Richard had been taken to OR for emergency cerclage. Staff reports that Ms Kayla Richard and her SO had an argument as she was being taken to the OR. No details of the argument were available, but indications are that Ms Kayla Richard was emotionally distressed. Should any further emotionally distressing everts after Ms Kayla Richard returns to her room, please page the chaplain if staff deems it appropriate.  Benjie Karvonen. Cid Agena, DMin, MDiv, MA [place]

## 2015-04-15 NOTE — Anesthesia Postprocedure Evaluation (Signed)
  Anesthesia Post-op Note  Patient: Kayla Richard  Procedure(s) Performed: Procedure(s): CERCLAGE CERVICAL (N/A)  Patient Location: PACU  Anesthesia Type:Spinal  Level of Consciousness: awake and alert   Airway and Oxygen Therapy: Patient Spontanous Breathing  Post-op Pain: none  Post-op Assessment: Post-op Vital signs reviewed, Patient's Cardiovascular Status Stable, Respiratory Function Stable, Patent Airway and No signs of Nausea or vomiting LLE Motor Response: No movement due to regional block LLE Sensation: Numbness RLE Motor Response: No movement due to regional block RLE Sensation: Numbness      Post-op Vital Signs: Reviewed and stable  Last Vitals:  Filed Vitals:   04/15/15 1315  BP: 102/51  Pulse: 72  Temp:   Resp: 18    Complications: No apparent anesthesia complications

## 2015-04-16 DIAGNOSIS — O3430 Maternal care for cervical incompetence, unspecified trimester: Secondary | ICD-10-CM | POA: Diagnosis not present

## 2015-04-16 DIAGNOSIS — O26872 Cervical shortening, second trimester: Secondary | ICD-10-CM | POA: Diagnosis not present

## 2015-04-16 MED ORDER — HYDROXYPROGESTERONE CAPROATE 250 MG/ML IM OIL
250.0000 mg | TOPICAL_OIL | INTRAMUSCULAR | Status: DC
  Administered 2015-04-16: 250 mg via INTRAMUSCULAR
  Filled 2015-04-16: qty 1

## 2015-04-16 NOTE — Progress Notes (Addendum)
Hospital day # 2 pregnancy at 63 4/7--Incompetent cervix, with cerclage placed 04/15/15.  S:  Doing well--partner here, no conflict today--reports "he got on my nerves yesterday", but all is well today.      Perception of contractions: None      Vaginal bleeding: None       Vaginal discharge:  None  In slight Trendelenburg.  Has BR privileges, may sit up to eat.  O: BP 130/61 mmHg  Pulse 99  Temp(Src) 97.9 F (36.6 C) (Oral)  Resp 18  Ht 5' (1.524 m)  Wt 101.152 kg (223 lb)  BMI 43.55 kg/m2  SpO2 100%      FHR:  146 at 0930      Contractions:  None per patient       Uterus non-tender      Extremities: no significant edema and no signs of DVT.  SCDs in place          Labs:  NA       Meds:  . clindamycin (CLEOCIN) douche   Vaginal Once  . docusate sodium  100 mg Oral Daily  . hydroxyprogesterone caproate  250 mg Intramuscular Weekly  . indomethacin  25 mg Oral 3 times per day  . prenatal multivitamin  1 tablet Oral Q1200  . sodium chloride  3 mL Intravenous Q12H    A: IUP at 16 4/7 weeks     Incompetent cervix, cerclage placed 04/15/15     Hx 18 week loss     Stable  P: Continue current plan of care--will transfer to Antenatal per Dr. Normand Sloop      Upcoming tests/treatments:  Needs 17P injection weekly.        Indocin x 24 hours, then plan Ibuprophen use at home for prn           cramping (recommend oral solution, due to patient's difficulty taking      large pills.     MFM recommendations:    Serial [place] weekly for cervical length at present  Follow for clinical s/s of infection  Advance activity as tolerated        MDs will follow       Anticipate d/c tomorrow       Reviewed patient status with Case Management.  Nigel Bridgeman CNM, [place] 04/16/2015 11:48 AM   Pt seen agree with above.  Will take out of trendelenberg and have bathroom privileges.  Plan DC home tomorrow if stable Fu on Fridays for 17 P and [place] for cervical length

## 2015-04-16 NOTE — Addendum Note (Signed)
Addendum  created 04/16/15 1610 by Shanon Payor, CRNA   Modules edited: Notes Section   Notes Section:  File: [id number]

## 2015-04-16 NOTE — Progress Notes (Signed)
Kayla Richard was lying in bed when I arrived; husband bedside. She said she was doing ok. She and husband were very pleasant and excited about first baby arrival. Husband shared they had a loss last year at 18wks.  Please call if any future support is needed. Chaplain Elmarie Shiley Holder   04/16/15 1300  Clinical Encounter Type  Visited With Patient and family together

## 2015-04-16 NOTE — Anesthesia Postprocedure Evaluation (Signed)
  Anesthesia Post-op Note  Patient: Kayla Richard  Procedure(s) Performed: Procedure(s): CERCLAGE CERVICAL (N/A)  Patient Location: ICU  Anesthesia Type:Spinal  Level of Consciousness: awake, alert  and oriented  Airway and Oxygen Therapy: Patient Spontanous Breathing  Post-op Pain: none  Post-op Assessment: Post-op Vital signs reviewed, Patient's Cardiovascular Status Stable, Respiratory Function Stable, Patent Airway, No signs of Nausea or vomiting, Adequate PO intake, Pain level controlled, No headache and No backache      Post-op Vital Signs: Reviewed and stable  Last Vitals:  Filed Vitals:   04/16/15 0419  BP:   Pulse: 68  Temp:   Resp:     Complications: No apparent anesthesia complications

## 2015-04-16 NOTE — Progress Notes (Signed)
UR chart review completed.  

## 2015-04-17 DIAGNOSIS — O26872 Cervical shortening, second trimester: Secondary | ICD-10-CM | POA: Diagnosis not present

## 2015-04-17 MED ORDER — DOCUSATE SODIUM 100 MG PO CAPS: 100.0000 mg | ORAL_CAPSULE | Freq: Every day | ORAL | Status: DC

## 2015-04-17 NOTE — Discharge Instructions (Signed)
Pelvic Rest Pelvic rest is sometimes recommended for women when:   The placenta is partially or completely covering the opening of the cervix (placenta previa).  There is bleeding between the uterine wall and the amniotic sac in the first trimester (subchorionic hemorrhage).  The cervix begins to open without labor starting (incompetent cervix, cervical insufficiency).  The labor is too early (preterm labor). HOME CARE INSTRUCTIONS  Do not have sexual intercourse, stimulation, or an orgasm.  Do not use tampons, douche, or put anything in the vagina.  Do not lift anything over 10 pounds (4.5 kg).  Avoid strenuous activity or straining your pelvic muscles. SEEK MEDICAL CARE IF:  You have any vaginal bleeding during pregnancy. Treat this as a potential emergency.  You have cramping pain felt low in the stomach (stronger than menstrual cramps).  You notice vaginal discharge (watery, mucus, or bloody).  You have a low, dull backache.  There are regular contractions or uterine tightening. SEEK IMMEDIATE MEDICAL CARE IF: You have vaginal bleeding and have placenta previa.  Document Released: 12/29/2010 Document Revised: 11/26/2011 Document Reviewed: 12/29/2010 [facility] Patient Information 2015 [place], [place]. This information is not intended to replace advice given to you by your health care provider. Make sure you discuss any questions you have with your health care provider.  Cervical Cerclage, Care After Refer to this sheet in the next few weeks. These instructions provide you with information on caring for yourself after your procedure. Your health care provider may also give you more specific instructions. Your treatment has been planned according to current medical practices, but problems sometimes occur. Call your health care provider if you have any problems or questions after your procedure. WHAT TO EXPECT AFTER THE PROCEDURE  After your procedure, it is typical to have the  following:  Abdominal cramping.  Vaginal spotting. HOME CARE INSTRUCTIONS   Only take over-the-counter or prescription medicines for pain, discomfort, or fever as directed by your health care provider.  Avoid physical activities and exercise until your health care provider says it is okay.  Do not douche or have sexual intercourse until your health care provider tells you it is okay.  Keep your follow-up surgical and prenatal appointments with your health care provider. SEEK MEDICAL CARE IF:   You have abnormal vaginal discharge.  You have a rash.  You become lightheaded or feel faint.  You have abdominal pain that is not controlled with pain medicine. SEEK IMMEDIATE MEDICAL CARE IF:   You develop vaginal bleeding.  You are leaking fluid or have a gush of fluid from the vagina.  You have a fever.  You faint.  You have uterine contractions.  You feel your baby is not moving as much as usual, or you cannot feel your baby move.  You have chest pain or shortness of breath. Document Released: 06/24/2013 Document Revised: 09/08/2013 Document Reviewed: 06/24/2013 [facility] Patient Information 2015 [place], [place]. This information is not intended to replace advice given to you by your health care provider. Make sure you discuss any questions you have with your health care provider.

## 2015-04-17 NOTE — Discharge Summary (Signed)
Physician Discharge Summary  Patient ID: Kayla Richard MRN: [id number] DOB/AGE: 11-Jun-1991 24 y.o.  Admit date: 04/14/2015 Discharge date: 04/17/2015  Admission Diagnoses: Short Cervix  Discharge Diagnoses:  Active Problems:   Short cervix   Incompetent cervix   Cervical cerclage suture present   Discharged Condition: stable  Hospital Course: Antibiotics, Cerclage Placement  Consults: MFM  Significant Diagnostic Studies: [place]  Treatments: IV hydration, antibiotics: Cleocin and surgery: Cerclage  Discharge Exam: Blood pressure 101/49, pulse 74, temperature 98.1 F (36.7 C), temperature source Oral, resp. rate 18, height 5' (1.524 m), weight 101.152 kg (223 lb), SpO2 100 %, unknown if currently breastfeeding. General appearance: alert, cooperative and no distress Resp: clear to auscultation bilaterally Chest wall: no tenderness Cardio: regular rate and rhythm GI: soft, non-tender; bowel sounds normal; no masses,  no organomegaly Extremities: extremities normal, atraumatic, no cyanosis or edema Pulses: 2+ and symmetric Skin: Warm & Dry  Disposition: 01-Home or Self Care  Discharge Instructions    Discharge activity: Bedrest    Complete by:  As directed      Discharge diet:  No restrictions    Complete by:  As directed      Discharge instructions    Complete by:  As directed   Per Discussion: Complete BR, Wkly Appts every Friday for [place] and Progesterone Injection.     Do not have sex or do anything that might make you have an orgasm    Complete by:  As directed      Notify physician for a general feeling that "something is not right"    Complete by:  As directed      Notify physician for increase or change in vaginal discharge    Complete by:  As directed      Notify physician for intestinal cramps, with or without diarrhea, sometimes described as "gas pain"    Complete by:  As directed      Notify physician for leaking of fluid    Complete by:  As directed      Notify physician for low, dull backache, unrelieved by heat or Tylenol    Complete by:  As directed      Notify physician for menstrual like cramps    Complete by:  As directed      Notify physician for pelvic pressure    Complete by:  As directed      Notify physician for uterine contractions.  These may be painless and feel like the uterus is tightening or the baby is  "balling up"    Complete by:  As directed      Notify physician for vaginal bleeding    Complete by:  As directed      PRETERM LABOR:  Includes any of the follwing symptoms that occur between 20 - [redacted] weeks gestation.  If these symptoms are not stopped, preterm labor can result in preterm delivery, placing your baby at risk    Complete by:  As directed             Medication List    STOP taking these medications        amoxicillin 500 MG capsule  Commonly known as:  AMOXIL      TAKE these medications        docusate sodium 100 MG capsule  Commonly known as:  COLACE  Take 1 capsule (100 mg total) by mouth daily.     prenatal multivitamin Tabs tablet  Take 1 tablet by mouth  daily at 12 noon.           Follow-up Information    Follow up with [facility] On 04/22/2015.   Specialty:  Obstetrics and Gynecology   Why:  Please call if you have any questions or concerns prior to your next visit.   Contact information:   [address] [place] [address] [phone]      Signed: Marlene Bast MSN, CNM 04/17/2015, 8:39 AM

## 2015-04-18 ENCOUNTER — Encounter (HOSPITAL_COMMUNITY): Payer: Self-pay | Admitting: Obstetrics and Gynecology

## 2015-04-19 LAB — 17-HYDROXYPROGESTERONE: 17-OH-Progesterone, LC/MS/MS: 414 ng/dL — ABNORMAL HIGH

## 2015-05-01 ENCOUNTER — Inpatient Hospital Stay (HOSPITAL_COMMUNITY)
Admission: AD | Admit: 2015-05-01 | Discharge: 2015-05-03 | DRG: 981 | Disposition: A | Discharge status: Home / Self Care | Payer: Medicaid Other | Source: Ambulatory Visit | Attending: Obstetrics and Gynecology | Admitting: Obstetrics and Gynecology

## 2015-05-01 ENCOUNTER — Encounter (HOSPITAL_COMMUNITY): Payer: Self-pay

## 2015-05-01 DIAGNOSIS — Z6841 Body Mass Index (BMI) 40.0 and over, adult: Secondary | ICD-10-CM

## 2015-05-01 DIAGNOSIS — O42912 Preterm premature rupture of membranes, unspecified as to length of time between rupture and onset of labor, second trimester: Principal | ICD-10-CM | POA: Diagnosis present

## 2015-05-01 DIAGNOSIS — O99214 Obesity complicating childbirth: Secondary | ICD-10-CM | POA: Diagnosis present

## 2015-05-01 DIAGNOSIS — Z3A19 19 weeks gestation of pregnancy: Secondary | ICD-10-CM | POA: Diagnosis present

## 2015-05-01 DIAGNOSIS — D649 Anemia, unspecified: Secondary | ICD-10-CM | POA: Diagnosis present

## 2015-05-01 DIAGNOSIS — O4290 Premature rupture of membranes, unspecified as to length of time between rupture and onset of labor, unspecified weeks of gestation: Secondary | ICD-10-CM

## 2015-05-01 DIAGNOSIS — O9081 Anemia of the puerperium: Secondary | ICD-10-CM | POA: Diagnosis present

## 2015-05-01 DIAGNOSIS — Z87891 Personal history of nicotine dependence: Secondary | ICD-10-CM

## 2015-05-01 DIAGNOSIS — O411230 Chorioamnionitis, third trimester, not applicable or unspecified: Secondary | ICD-10-CM | POA: Diagnosis present

## 2015-05-01 DIAGNOSIS — O3432 Maternal care for cervical incompetence, second trimester: Secondary | ICD-10-CM | POA: Diagnosis present

## 2015-05-01 DIAGNOSIS — Z3490 Encounter for supervision of normal pregnancy, unspecified, unspecified trimester: Secondary | ICD-10-CM

## 2015-05-01 DIAGNOSIS — Z8249 Family history of ischemic heart disease and other diseases of the circulatory system: Secondary | ICD-10-CM

## 2015-05-01 DIAGNOSIS — Z833 Family history of diabetes mellitus: Secondary | ICD-10-CM

## 2015-05-01 LAB — CBC
HEMATOCRIT: 32.8 % — AB (ref 36.0–46.0)
Hemoglobin: 11.1 g/dL — ABNORMAL LOW (ref 12.0–15.0)
MCH: 30 pg (ref 26.0–34.0)
MCHC: 33.8 g/dL (ref 30.0–36.0)
MCV: 88.6 fL (ref 78.0–100.0)
Platelets: 210 10*3/uL (ref 150–400)
RBC: 3.7 MIL/uL — ABNORMAL LOW (ref 3.87–5.11)
RDW: 13.2 % (ref 11.5–15.5)
WBC: 21.8 10*3/uL — AB (ref 4.0–10.5)

## 2015-05-01 LAB — URINALYSIS, ROUTINE W REFLEX MICROSCOPIC
BILIRUBIN URINE: NEGATIVE
Glucose, UA: NEGATIVE mg/dL
Ketones, ur: NEGATIVE mg/dL
Nitrite: NEGATIVE
Protein, ur: NEGATIVE mg/dL
Specific Gravity, Urine: 1.025 (ref 1.005–1.030)
UROBILINOGEN UA: 0.2 mg/dL (ref 0.0–1.0)
pH: 6.5 (ref 5.0–8.0)

## 2015-05-01 LAB — URINE MICROSCOPIC-ADD ON

## 2015-05-01 NOTE — MAU Note (Signed)
Pt c/o lower abdominal and back pain that started today. Pt has not tried anything for pain. Denies vaginal bleeding but states that she is having a lot of discharge. Had a cerclage placed 2 weeks ago. Denies urinary s/s. Denies N/V/D, but having some constipation. Last BM was this morning.

## 2015-05-02 ENCOUNTER — Inpatient Hospital Stay (HOSPITAL_COMMUNITY): Payer: Medicaid Other

## 2015-05-02 ENCOUNTER — Encounter (HOSPITAL_COMMUNITY): Payer: Self-pay | Admitting: *Deleted

## 2015-05-02 DIAGNOSIS — O42912 Preterm premature rupture of membranes, unspecified as to length of time between rupture and onset of labor, second trimester: Secondary | ICD-10-CM | POA: Diagnosis present

## 2015-05-02 DIAGNOSIS — Z833 Family history of diabetes mellitus: Secondary | ICD-10-CM | POA: Diagnosis not present

## 2015-05-02 DIAGNOSIS — O3432 Maternal care for cervical incompetence, second trimester: Secondary | ICD-10-CM | POA: Diagnosis present

## 2015-05-02 DIAGNOSIS — Z6841 Body Mass Index (BMI) 40.0 and over, adult: Secondary | ICD-10-CM

## 2015-05-02 DIAGNOSIS — Z8249 Family history of ischemic heart disease and other diseases of the circulatory system: Secondary | ICD-10-CM | POA: Diagnosis not present

## 2015-05-02 DIAGNOSIS — O99214 Obesity complicating childbirth: Secondary | ICD-10-CM | POA: Diagnosis present

## 2015-05-02 DIAGNOSIS — R109 Unspecified abdominal pain: Secondary | ICD-10-CM | POA: Diagnosis present

## 2015-05-02 DIAGNOSIS — O4290 Premature rupture of membranes, unspecified as to length of time between rupture and onset of labor, unspecified weeks of gestation: Secondary | ICD-10-CM | POA: Diagnosis present

## 2015-05-02 DIAGNOSIS — D649 Anemia, unspecified: Secondary | ICD-10-CM | POA: Diagnosis present

## 2015-05-02 DIAGNOSIS — Z3A19 19 weeks gestation of pregnancy: Secondary | ICD-10-CM | POA: Diagnosis present

## 2015-05-02 DIAGNOSIS — O9081 Anemia of the puerperium: Secondary | ICD-10-CM | POA: Diagnosis present

## 2015-05-02 DIAGNOSIS — Z3490 Encounter for supervision of normal pregnancy, unspecified, unspecified trimester: Secondary | ICD-10-CM

## 2015-05-02 DIAGNOSIS — Z87891 Personal history of nicotine dependence: Secondary | ICD-10-CM | POA: Diagnosis not present

## 2015-05-02 DIAGNOSIS — N883 Incompetence of cervix uteri: Secondary | ICD-10-CM

## 2015-05-02 DIAGNOSIS — O411230 Chorioamnionitis, third trimester, not applicable or unspecified: Secondary | ICD-10-CM | POA: Diagnosis present

## 2015-05-02 LAB — DIFFERENTIAL
BASOS ABS: 0 10*3/uL (ref 0.0–0.1)
BASOS PCT: 0 % (ref 0–1)
EOS ABS: 0.2 10*3/uL (ref 0.0–0.7)
EOS PCT: 1 % (ref 0–5)
Lymphocytes Relative: 11 % — ABNORMAL LOW (ref 12–46)
Lymphs Abs: 2.4 10*3/uL (ref 0.7–4.0)
Monocytes Absolute: 1.1 10*3/uL — ABNORMAL HIGH (ref 0.1–1.0)
Monocytes Relative: 5 % (ref 3–12)
NEUTROS PCT: 83 % — AB (ref 43–77)
Neutro Abs: 18 10*3/uL — ABNORMAL HIGH (ref 1.7–7.7)

## 2015-05-02 LAB — COMPREHENSIVE METABOLIC PANEL
ALBUMIN: 3.2 g/dL — AB (ref 3.5–5.0)
ALT: 18 U/L (ref 14–54)
AST: 17 U/L (ref 15–41)
Alkaline Phosphatase: 54 U/L (ref 38–126)
Anion gap: 6 (ref 5–15)
BUN: 7 mg/dL (ref 6–20)
CALCIUM: 8.8 mg/dL — AB (ref 8.9–10.3)
CO2: 24 mmol/L (ref 22–32)
CREATININE: 0.78 mg/dL (ref 0.44–1.00)
Chloride: 104 mmol/L (ref 101–111)
GFR calc Af Amer: >60 mL/min (ref >60)
GFR calc non Af Amer: >60 mL/min (ref >60)
GLUCOSE: 122 mg/dL — AB (ref 65–99)
Potassium: 4.4 mmol/L (ref 3.5–5.1)
SODIUM: 134 mmol/L — AB (ref 135–145)
TOTAL PROTEIN: 6.9 g/dL (ref 6.5–8.1)
Total Bilirubin: 0.3 mg/dL (ref 0.3–1.2)

## 2015-05-02 LAB — URIC ACID: Uric Acid, Serum: 3.1 mg/dL (ref 2.3–6.6)

## 2015-05-02 LAB — CBC WITH DIFFERENTIAL/PLATELET
BASOS ABS: 0 10*3/uL (ref 0.0–0.1)
BASOS PCT: 0 % (ref 0–1)
Eosinophils Absolute: 0 10*3/uL (ref 0.0–0.7)
Eosinophils Relative: 0 % (ref 0–5)
HEMATOCRIT: 34.2 % — AB (ref 36.0–46.0)
HEMOGLOBIN: 11.5 g/dL — AB (ref 12.0–15.0)
LYMPHS PCT: 7 % — AB (ref 12–46)
Lymphs Abs: 1.6 10*3/uL (ref 0.7–4.0)
MCH: 29.6 pg (ref 26.0–34.0)
MCHC: 33.6 g/dL (ref 30.0–36.0)
MCV: 87.9 fL (ref 78.0–100.0)
MONO ABS: 1.6 10*3/uL — AB (ref 0.1–1.0)
MONOS PCT: 6 % (ref 3–12)
NEUTROS ABS: 21.5 10*3/uL — AB (ref 1.7–7.7)
NEUTROS PCT: 87 % — AB (ref 43–77)
Platelets: 218 10*3/uL (ref 150–400)
RBC: 3.89 MIL/uL (ref 3.87–5.11)
RDW: 12.8 % (ref 11.5–15.5)
WBC: 24.7 10*3/uL — ABNORMAL HIGH (ref 4.0–10.5)

## 2015-05-02 LAB — PROTEIN / CREATININE RATIO, URINE
Creatinine, Urine: 219 mg/dL
PROTEIN CREATININE RATIO: 0.1 mg/mg{creat} (ref 0.00–0.15)
TOTAL PROTEIN, URINE: 22 mg/dL

## 2015-05-02 LAB — LACTATE DEHYDROGENASE: LDH: 111 U/L (ref 98–192)

## 2015-05-02 LAB — TYPE AND SCREEN
ABO/RH(D): B POS
ANTIBODY SCREEN: NEGATIVE

## 2015-05-02 LAB — WET PREP, GENITAL
CLUE CELLS WET PREP: NONE SEEN
TRICH WET PREP: NONE SEEN
Yeast Wet Prep HPF POC: NONE SEEN

## 2015-05-02 LAB — AMNISURE RUPTURE OF MEMBRANE (ROM) NOT AT ARMC: AMNISURE: NEGATIVE

## 2015-05-02 MED ORDER — PRENATAL MULTIVITAMIN CH
1.0000 | ORAL_TABLET | Freq: Every day | ORAL | Status: DC
  Administered 2015-05-02: 1 via ORAL
  Filled 2015-05-02: qty 1

## 2015-05-02 MED ORDER — SIMETHICONE 80 MG PO CHEW: 80.0000 mg | CHEWABLE_TABLET | ORAL | Status: DC | PRN

## 2015-05-02 MED ORDER — LACTATED RINGERS IV SOLN: 500.0000 mL | INTRAVENOUS | Status: DC | PRN

## 2015-05-02 MED ORDER — ACETAMINOPHEN 325 MG PO TABS: 650.0000 mg | ORAL_TABLET | ORAL | Status: DC | PRN

## 2015-05-02 MED ORDER — ONDANSETRON HCL 4 MG PO TABS: 4.0000 mg | ORAL_TABLET | ORAL | Status: DC | PRN

## 2015-05-02 MED ORDER — BENZOCAINE-MENTHOL 20-0.5 % EX AERO: 1.0000 "application " | INHALATION_SPRAY | CUTANEOUS | Status: DC | PRN

## 2015-05-02 MED ORDER — LACTATED RINGERS IV SOLN: INTRAVENOUS | Status: DC

## 2015-05-02 MED ORDER — OXYCODONE-ACETAMINOPHEN 5-325 MG PO TABS: 2.0000 | ORAL_TABLET | ORAL | Status: DC | PRN

## 2015-05-02 MED ORDER — WITCH HAZEL-GLYCERIN EX PADS: 1.0000 "application " | MEDICATED_PAD | CUTANEOUS | Status: DC | PRN

## 2015-05-02 MED ORDER — OXYTOCIN 40 UNITS IN LACTATED RINGERS INFUSION - SIMPLE MED
1.0000 m[IU]/min | INTRAVENOUS | Status: DC
  Administered 2015-05-02: 1 m[IU]/min via INTRAVENOUS
  Filled 2015-05-02: qty 1000

## 2015-05-02 MED ORDER — DIPHENHYDRAMINE HCL 25 MG PO CAPS: 25.0000 mg | ORAL_CAPSULE | Freq: Four times a day (QID) | ORAL | Status: DC | PRN

## 2015-05-02 MED ORDER — CALCIUM CARBONATE ANTACID 500 MG PO CHEW: 2.0000 | CHEWABLE_TABLET | ORAL | Status: DC | PRN

## 2015-05-02 MED ORDER — DOCUSATE SODIUM 100 MG PO CAPS
100.0000 mg | ORAL_CAPSULE | Freq: Every day | ORAL | Status: DC
  Administered 2015-05-02 – 2015-05-03 (×2): 100 mg via ORAL
  Filled 2015-05-02 (×2): qty 1

## 2015-05-02 MED ORDER — OXYTOCIN 40 UNITS IN LACTATED RINGERS INFUSION - SIMPLE MED: 62.5000 mL/h | INTRAVENOUS | Status: DC

## 2015-05-02 MED ORDER — LACTATED RINGERS IV SOLN
INTRAVENOUS | Status: DC
  Administered 2015-05-02 (×2): via INTRAVENOUS

## 2015-05-02 MED ORDER — ZOLPIDEM TARTRATE 5 MG PO TABS: 5.0000 mg | ORAL_TABLET | Freq: Every evening | ORAL | Status: DC | PRN

## 2015-05-02 MED ORDER — OXYTOCIN BOLUS FROM INFUSION: 500.0000 mL | INTRAVENOUS | Status: DC

## 2015-05-02 MED ORDER — CITRIC ACID-SODIUM CITRATE 334-500 MG/5ML PO SOLN: 30.0000 mL | ORAL | Status: DC | PRN

## 2015-05-02 MED ORDER — NALBUPHINE HCL 10 MG/ML IJ SOLN
10.0000 mg | INTRAMUSCULAR | Status: DC | PRN
  Administered 2015-05-02 (×2): 10 mg via INTRAVENOUS
  Filled 2015-05-02 (×3): qty 1

## 2015-05-02 MED ORDER — OXYCODONE-ACETAMINOPHEN 5-325 MG PO TABS: 1.0000 | ORAL_TABLET | ORAL | Status: DC | PRN

## 2015-05-02 MED ORDER — TETANUS-DIPHTH-ACELL PERTUSSIS 5-2.5-18.5 LF-MCG/0.5 IM SUSP: 0.5000 mL | Freq: Once | INTRAMUSCULAR | Status: DC

## 2015-05-02 MED ORDER — TERBUTALINE SULFATE 1 MG/ML IJ SOLN: 0.2500 mg | Freq: Once | INTRAMUSCULAR | Status: DC | PRN

## 2015-05-02 MED ORDER — FENTANYL CITRATE (PF) 100 MCG/2ML IJ SOLN
50.0000 ug | INTRAMUSCULAR | Status: DC | PRN
Start: 2015-05-02 — End: 2015-05-03
  Administered 2015-05-02: 100 ug via INTRAVENOUS
  Filled 2015-05-02: qty 2

## 2015-05-02 MED ORDER — DIBUCAINE 1 % RE OINT: 1.0000 "application " | TOPICAL_OINTMENT | RECTAL | Status: DC | PRN

## 2015-05-02 MED ORDER — SODIUM CHLORIDE 0.9 % IV SOLN
3.0000 g | Freq: Four times a day (QID) | INTRAVENOUS | Status: DC
  Administered 2015-05-02 – 2015-05-03 (×5): 3 g via INTRAVENOUS
  Filled 2015-05-02 (×8): qty 3

## 2015-05-02 MED ORDER — ONDANSETRON HCL 4 MG/2ML IJ SOLN: 4.0000 mg | Freq: Four times a day (QID) | INTRAMUSCULAR | Status: DC | PRN

## 2015-05-02 MED ORDER — ONDANSETRON HCL 4 MG/2ML IJ SOLN: 4.0000 mg | INTRAMUSCULAR | Status: DC | PRN

## 2015-05-02 MED ORDER — ACETAMINOPHEN 325 MG PO TABS
650.0000 mg | ORAL_TABLET | ORAL | Status: DC | PRN
Start: 2015-05-02 — End: 2015-05-03

## 2015-05-02 MED ORDER — LANOLIN HYDROUS EX OINT: TOPICAL_OINTMENT | CUTANEOUS | Status: DC | PRN

## 2015-05-02 MED ORDER — SENNOSIDES-DOCUSATE SODIUM 8.6-50 MG PO TABS
2.0000 | ORAL_TABLET | ORAL | Status: DC
  Filled 2015-05-02: qty 2

## 2015-05-02 MED ORDER — PRENATAL MULTIVITAMIN CH
1.0000 | ORAL_TABLET | Freq: Every day | ORAL | Status: DC
  Administered 2015-05-03: 1 via ORAL
  Filled 2015-05-02: qty 1

## 2015-05-02 MED ORDER — LIDOCAINE HCL (PF) 1 % IJ SOLN
30.0000 mL | INTRAMUSCULAR | Status: DC | PRN
  Filled 2015-05-02: qty 30

## 2015-05-02 MED ORDER — NALBUPHINE HCL 10 MG/ML IJ SOLN
10.0000 mg | INTRAMUSCULAR | Status: DC | PRN
  Administered 2015-05-02: 10 mg via INTRAVENOUS
  Filled 2015-05-02: qty 1

## 2015-05-02 MED ORDER — IBUPROFEN 600 MG PO TABS
600.0000 mg | ORAL_TABLET | Freq: Four times a day (QID) | ORAL | Status: DC
  Administered 2015-05-03 (×2): 600 mg via ORAL
  Filled 2015-05-02 (×3): qty 1

## 2015-05-02 NOTE — Progress Notes (Signed)
S: Nurse call reports patient with increased bleeding.  In room to assess.  Patient pad with small to moderate amt blood.  Blood also noted on sheets.  Nurse actively cleaning patient. Patient reports minimal pain and declines intervention. States "I am waiting for my family."  FOB at bedside, questions why bleeding is occurring.   O:  Filed Vitals:   05/02/15 1220  BP: 138/71  Pulse: 107  Temp:   Resp:    A: IUP at 19wks PPROM Bleeding  P: Patient declines vaginal exam Informed that can not assess why bleeding is occuring without cervical inspection via bimanual or speculum Patient informed that if bleeding continues and becomes excessive, measures would be taken to save patient--Patient verbalized understanding Nurse instructed to doppler patient now Dr. Kathie Rhodes. Rivard to be updated on patient status Will continue to monitor  [facility], Lazarus [place]bury LYNN, CNM 05/02/2015 1:30 PM

## 2015-05-02 NOTE — H&P (Addendum)
Kayla Richard is a 24 y.o. female, G3P0020 at 19.0 weeks, presenting for acute onset of foul smelling/thick discharge and intermittent low abdominal cramping since 11 am on 05/01/15. No treatments tried as pain has been bearable. Pt contacted on call provider on 04/29/15 for "leaking" and was advised to come to MAU for an evaluation but states she did not come because issue resolved itself.   Denies CP, SOB or NV  Last BM 2-3 days ago  Pt is s/p cerclage placement on 04/15/15 and was hospitalized for same from 7/28-31/2016.   States was supposed to be on bedrest, but went to grocery store on 05/01/15 and noted pain/foul smelling discharge during her outing.  Denies intercourse since cerclage placed.  Declines pain medication - "I do not want anything that could potentially make my baby come early."  Admits to not taking Prometrium last evening.  Patient Active Problem List   Diagnosis Date Noted  . PROM (premature rupture of membranes) 05/02/2015  . Morbid obesity with BMI of 40.0-44.9, adult 05/02/2015  . Cervical cerclage suture present 04/16/2015  . Incompetent cervix 04/15/2015  . Hx of PTL (preterm labor), current pregnancy 04/14/2015  . Short cervix 04/14/2015    History of present pregnancy: Patient entered care at 12.4 weeks.   EDC of 09/26/15 was established by u/s at 9.2 wks.   [place] evaluations: 14.3 wks due to h/o 18-wk loss: SIUP. Normal fluid. Cvx 4.68 cm.   16.3 wks due to h/o 18-wk loss: Cervical length 0.26 w/ funneling -- admitted to [facility] for cerclage placement. 17.4 wks f/u cerclage placement: Cvx 0.383, funneling noted. Cerclage in place, debris in canal. 18 wks for cervical length due to cerclage placement: Vertex. Posterior placenta. Normal fluid. AP pocket = 5.5 cm. Cvx 0.647 cm, cerclage seen; closed at os. Funneling present. Debris seen. Significant prenatal events: Cramping at 13.2 wks -- no significant findings. Cervical incompetence (h/o 18.0 wk loss) -- cerclage  placed on 04/15/15 by Dr. Normand Sloop. Hospitalized from 7/28-31/2016 -- Per MFM 1) bed/pelvic rest, 2) cervical length q 2 wks and 3) Prometrium 200 mg per vagina q hs. 9-lb TWG thus far. No MAU visits.   Last evaluation: Office 04/25/15 @ 18 wks by Dr. Stefano Gaul. BP 104/70. Wt: 222 lbs.  OB History    Gravida Para Term Preterm AB TAB SAB Ectopic Multiple Living   3    2 1 1        TAB in 2009 @ 12 wks SAB on 02/18/2014 @ 18 wks -- ROM followed by labor; ultimately delivering at home.  Past Medical History  Diagnosis Date  . Cyst of breast    Past Surgical History  Procedure Laterality Date  . Cervical cerclage N/A 04/15/2015    Procedure: CERCLAGE CERVICAL;  Surgeon: Jaymes Graff, MD;  Location: [facility];  Service: Gynecology;  Laterality: N/A;   Family History: SCT in her sister, HTN in her mother and DM in her father. Social History:  reports that she has quit smoking. She does not have any smokeless tobacco history on file. She reports that she does not drink alcohol or use illicit drugs. Patient is married, with FOB Beryle Lathe) involved and supportive. She is African American in ethnicity, willing to accept blood in an emergency, employed as a Engineer, site and high-school educated.    Prenatal Transfer Tool  Maternal Diabetes: Unknown Genetic Screening: Normal Maternal Ultrasounds/Referrals: Abnormal:  Findings:   Other:Funneling Fetal Ultrasounds or other Referrals:  Referred to Materal Fetal  Medicine  Maternal Substance Abuse:  No Significant Maternal Medications: Prometrium 200 mg per vagina qhs, Stool Softener Significant Maternal Lab Results: Lab values include: Other: None  TDAP: NA Flu: NA  ROS: Cramping, -VB, +LOF, +FM  No Known Allergies     Blood pressure 137/84, pulse 105, temperature 98.7 F (37.1 C), temperature source Oral, resp. rate 18, height 4' 11.5" (1.511 m), weight 104.237 kg (229 lb 12.8 oz), SpO2 99 %, unknown if currently  breastfeeding. Today's Vitals   05/02/15 0010 05/02/15 0357 05/02/15 0411 05/02/15 0412  BP: 130/55 137/84    Pulse: 112 105    Temp:   98.7 F (37.1 C)   TempSrc:      Resp: 18 18    Height:      Weight:      SpO2:      PainSc:    8    BPs in MAU:  159/94 @ 2241 142/70 @ 2258 140/76 at 2330 126/66 at 0007 130/55 at 0010  Gen: NAD. Chest clear. Heart RRR without murmur. Abd gravid, NT, no guarding or rebound, +BS. Pelvic: Obvious pooling. +Fern. BBOW (clear fluid) noted at os. Fetal parts not visualized. Malodorous d/c. Ext: 1+ calf/ankle edema.  Doptones: 150 bpm  Preliminary u/s per tech = cvx open; unable to measure, protruding BOW, footling breech.  Prenatal labs: ABO, Rh: --/--/B POS (07/28 2246) Antibody: NEG (07/28 2246) Rubella: Immune (03/11/15) RPR: NR (03/11/15)  HBsAg: Neg (03/11/15) HIV: NR (03/11/15) GBS: NA Sickle cell/Hgb electrophoresis: Normal study (03/11/15) Pap: Was to be done later in the pregnancy GC: Neg (03/11/15) Chlamydia: Neg (03/11/15) Genetic screenings: Neg   Glucola: NA Other: VZV immune. WBC 14.3 at NOB, 17.5 on 04/14/15 Hgb 12.6 at NOB  Results for orders placed or performed during the hospital encounter of 05/01/15 (from the past 24 hour(s))  Urinalysis, Routine w reflex microscopic (not at [facility])     Status: Abnormal   Collection Time: 05/01/15 10:44 PM  Result Value Ref Range   Color, Urine YELLOW YELLOW   APPearance CLEAR CLEAR   Specific Gravity, Urine 1.025 1.005 - 1.030   pH 6.5 5.0 - 8.0   Glucose, UA NEGATIVE NEGATIVE mg/dL   Hgb urine dipstick MODERATE (A) NEGATIVE   Bilirubin Urine NEGATIVE NEGATIVE   Ketones, ur NEGATIVE NEGATIVE mg/dL   Protein, ur NEGATIVE NEGATIVE mg/dL   Urobilinogen, UA 0.2 0.0 - 1.0 mg/dL   Nitrite NEGATIVE NEGATIVE   Leukocytes, UA MODERATE (A) NEGATIVE  Protein / creatinine ratio, urine     Status: None   Collection Time: 05/01/15 10:44 PM  Result Value Ref Range   Creatinine, Urine  219.00 mg/dL   Total Protein, Urine 22 mg/dL   Protein Creatinine Ratio 0.10 0.00 - 0.15 mg/mg[Cre]  Urine microscopic-add on     Status: Abnormal   Collection Time: 05/01/15 10:44 PM  Result Value Ref Range   Squamous Epithelial / LPF FEW (A) RARE   WBC, UA 11-20 <3 WBC/hpf   RBC / HPF 7-10 <3 RBC/hpf   Bacteria, UA RARE RARE   Urine-Other MUCOUS PRESENT   CBC     Status: Abnormal   Collection Time: 05/01/15 11:33 PM  Result Value Ref Range   WBC 21.8 (H) 4.0 - 10.5 K/uL   RBC 3.70 (L) 3.87 - 5.11 MIL/uL   Hemoglobin 11.1 (L) 12.0 - 15.0 g/dL   HCT 40.9 (L) 81.1 - 91.4 %   MCV 88.6 78.0 - 100.0 fL   MCH  30.0 26.0 - 34.0 pg   MCHC 33.8 30.0 - 36.0 g/dL   RDW 16.1 09.6 - 04.5 %   Platelets 210 150 - 400 K/uL  Comprehensive metabolic panel     Status: Abnormal   Collection Time: 05/01/15 11:33 PM  Result Value Ref Range   Sodium 134 (L) 135 - 145 mmol/L   Potassium 4.4 3.5 - 5.1 mmol/L   Chloride 104 101 - 111 mmol/L   CO2 24 22 - 32 mmol/L   Glucose, Bld 122 (H) 65 - 99 mg/dL   BUN 7 6 - 20 mg/dL   Creatinine, Ser 4.09 0.44 - 1.00 mg/dL   Calcium 8.8 (L) 8.9 - 10.3 mg/dL   Total Protein 6.9 6.5 - 8.1 g/dL   Albumin 3.2 (L) 3.5 - 5.0 g/dL   AST 17 15 - 41 U/L   ALT 18 14 - 54 U/L   Alkaline Phosphatase 54 38 - 126 U/L   Total Bilirubin 0.3 0.3 - 1.2 mg/dL   GFR calc non Af Amer >60 >60 mL/min   GFR calc Af Amer >60 >60 mL/min   Anion gap 6 5 - 15  Lactate dehydrogenase     Status: None   Collection Time: 05/01/15 11:33 PM  Result Value Ref Range   LDH 111 98 - 192 U/L  Uric acid     Status: None   Collection Time: 05/01/15 11:33 PM  Result Value Ref Range   Uric Acid, Serum 3.1 2.3 - 6.6 mg/dL  Differential     Status: Abnormal   Collection Time: 05/01/15 11:33 PM  Result Value Ref Range   Neutrophils Relative % 83 (H) 43 - 77 %   Neutro Abs 18.0 (H) 1.7 - 7.7 K/uL   Lymphocytes Relative 11 (L) 12 - 46 %   Lymphs Abs 2.4 0.7 - 4.0 K/uL   Monocytes Relative 5 3  - 12 %   Monocytes Absolute 1.1 (H) 0.1 - 1.0 K/uL   Eosinophils Relative 1 0 - 5 %   Eosinophils Absolute 0.2 0.0 - 0.7 K/uL   Basophils Relative 0 0 - 1 %   Basophils Absolute 0.0 0.0 - 0.1 K/uL    Assessment: IUP at 19.0 wks. previable PPROM. S/P cerclage placement on 04/15/15, desiring continuation of pregnancy. Elevated BPs in MAU - preE work-up neg. Elevated white count at 21.8.  Plan: Admit to [facility] per consult with Dr. Sallye Ober. Routine antenatal orders, to include IVFs. Q shift doptones. Continuous toco. Differential, urine cx, wet prep, GC/CT and amnisure. Pain med prn. NPO. Trendelenburg position.  Dr. Sallye Ober to come & talk w/ pt - pt not interested in removal of cerclage -- would very much like to preserve pregnancy for as long as possible. Support to couple for ROM.   Robyne Askew, MS 05/02/2015, 03:46 AM    I saw and examined patient at bedside and agree with above findings, assessment and plan. CNM Mayford Knife also reported to me that she had done vaginal fluid ferning test which was positive.  On my evaluation patient reported that the cramping had recently resolved by itself.  Her abdomen was tender in mid abdomen below the umbulicus, but no rebound, no guarding. A gentle speculum exam revealed tan yellow vaginal discharge with odor.  Wet mount was significant for only WBC, GC/chlam was sent.  Cervix was 2-3 cm dilated visually with bulging membranes through cervix, membranes appear opaque. Cerclage ends noted, but cannot visualize all of the cerclage.  Amnisure  done was negative.  I discussed with patient that given abdominal cramping, vaginal discharge, high WBC with shift and opaque membranes I am concerned about uterine infection.  I am also concerned about cerclage tearing through the cervix.   For these reasons I recommended removal of cerclage now.  I discussed with patient and FOB implications of cerclage removal at this time which included likely  delivery at previable fetus.  Patient declines removal of cerclage at this point, stating she wants everything possible done to prolong the pregnancy.  We discussed this later option may not be ideal given concerns for infection and sepsis and advanced cervical dilation.  Will get MFM consult.  Incoming OB Attending to follow up on patient. Follow up on possible rupture of membranes with peri-pad checks.  Will start unasyn.  Follow up on final ultrasound read.  Dr. Sallye Ober.

## 2015-05-02 NOTE — Progress Notes (Signed)
Antepartum LOS: 0 Kayla Richard, 24 y.o.,   OB History    Gravida Para Term Preterm AB TAB SAB Ectopic Multiple Living   3    2 1 1          Subjective -Patient resting in bed in Trendelenburg position.  Reports that she is coping okay.  Further reports pain, but declines medication stating she does "not want to do anything to hurt the baby."  Patient reports continued vaginal discharge in yellow color.    Objective  Filed Vitals:   05/02/15 0010 05/02/15 0357 05/02/15 0411 05/02/15 0735  BP: 130/55 137/84  140/66  Pulse: 112 105  107  Temp:   98.7 F (37.1 C) 98.7 F (37.1 C)  TempSrc:    Oral  Resp: 18 18  18   Height:      Weight:      SpO2:        Results for orders placed or performed during the hospital encounter of 05/01/15 (from the past 24 hour(s))  Urinalysis, Routine w reflex microscopic (not at [facility])     Status: Abnormal   Collection Time: 05/01/15 10:44 PM  Result Value Ref Range   Color, Urine YELLOW YELLOW   APPearance CLEAR CLEAR   Specific Gravity, Urine 1.025 1.005 - 1.030   pH 6.5 5.0 - 8.0   Glucose, UA NEGATIVE NEGATIVE mg/dL   Hgb urine dipstick MODERATE (A) NEGATIVE   Bilirubin Urine NEGATIVE NEGATIVE   Ketones, ur NEGATIVE NEGATIVE mg/dL   Protein, ur NEGATIVE NEGATIVE mg/dL   Urobilinogen, UA 0.2 0.0 - 1.0 mg/dL   Nitrite NEGATIVE NEGATIVE   Leukocytes, UA MODERATE (A) NEGATIVE  Protein / creatinine ratio, urine     Status: None   Collection Time: 05/01/15 10:44 PM  Result Value Ref Range   Creatinine, Urine 219.00 mg/dL   Total Protein, Urine 22 mg/dL   Protein Creatinine Ratio 0.10 0.00 - 0.15 mg/mg[Cre]  Urine microscopic-add on     Status: Abnormal   Collection Time: 05/01/15 10:44 PM  Result Value Ref Range   Squamous Epithelial / LPF FEW (A) RARE   WBC, UA 11-20 <3 WBC/hpf   RBC / HPF 7-10 <3 RBC/hpf   Bacteria, UA RARE RARE   Urine-Other MUCOUS PRESENT   CBC     Status: Abnormal   Collection Time: 05/01/15 11:33 PM  Result  Value Ref Range   WBC 21.8 (H) 4.0 - 10.5 K/uL   RBC 3.70 (L) 3.87 - 5.11 MIL/uL   Hemoglobin 11.1 (L) 12.0 - 15.0 g/dL   HCT 62.9 (L) 52.8 - 41.3 %   MCV 88.6 78.0 - 100.0 fL   MCH 30.0 26.0 - 34.0 pg   MCHC 33.8 30.0 - 36.0 g/dL   RDW 24.4 01.0 - 27.2 %   Platelets 210 150 - 400 K/uL  Comprehensive metabolic panel     Status: Abnormal   Collection Time: 05/01/15 11:33 PM  Result Value Ref Range   Sodium 134 (L) 135 - 145 mmol/L   Potassium 4.4 3.5 - 5.1 mmol/L   Chloride 104 101 - 111 mmol/L   CO2 24 22 - 32 mmol/L   Glucose, Bld 122 (H) 65 - 99 mg/dL   BUN 7 6 - 20 mg/dL   Creatinine, Ser 5.36 0.44 - 1.00 mg/dL   Calcium 8.8 (L) 8.9 - 10.3 mg/dL   Total Protein 6.9 6.5 - 8.1 g/dL   Albumin 3.2 (L) 3.5 - 5.0 g/dL   AST  17 15 - 41 U/L   ALT 18 14 - 54 U/L   Alkaline Phosphatase 54 38 - 126 U/L   Total Bilirubin 0.3 0.3 - 1.2 mg/dL   GFR calc non Af Amer >60 >60 mL/min   GFR calc Af Amer >60 >60 mL/min   Anion gap 6 5 - 15  Lactate dehydrogenase     Status: None   Collection Time: 05/01/15 11:33 PM  Result Value Ref Range   LDH 111 98 - 192 U/L  Uric acid     Status: None   Collection Time: 05/01/15 11:33 PM  Result Value Ref Range   Uric Acid, Serum 3.1 2.3 - 6.6 mg/dL  Differential     Status: Abnormal   Collection Time: 05/01/15 11:33 PM  Result Value Ref Range   Neutrophils Relative % 83 (H) 43 - 77 %   Neutro Abs 18.0 (H) 1.7 - 7.7 K/uL   Lymphocytes Relative 11 (L) 12 - 46 %   Lymphs Abs 2.4 0.7 - 4.0 K/uL   Monocytes Relative 5 3 - 12 %   Monocytes Absolute 1.1 (H) 0.1 - 1.0 K/uL   Eosinophils Relative 1 0 - 5 %   Eosinophils Absolute 0.2 0.0 - 0.7 K/uL   Basophils Relative 0 0 - 1 %   Basophils Absolute 0.0 0.0 - 0.1 K/uL  Wet prep, genital     Status: Abnormal   Collection Time: 05/02/15  5:59 AM  Result Value Ref Range   Yeast Wet Prep HPF POC NONE SEEN NONE SEEN   Trich, Wet Prep NONE SEEN NONE SEEN   Clue Cells Wet Prep HPF POC NONE SEEN NONE SEEN    WBC, Wet Prep HPF POC TOO NUMEROUS TO COUNT (A) NONE SEEN  Amnisure rupture of membrane (rom)not at [facility]     Status: None   Collection Time: 05/02/15  5:59 AM  Result Value Ref Range   Amnisure ROM NEGATIVE     Meds: Scheduled Meds: . ampicillin-sulbactam (UNASYN) IV  3 g Intravenous Q6H  . docusate sodium  100 mg Oral Daily  . prenatal multivitamin  1 tablet Oral Q1200   Continuous Infusions: . lactated ringers 125 mL/hr at 05/02/15 0414   PRN Meds:.acetaminophen, calcium carbonate, nalbuphine, zolpidem   Physical Exam  Constitutional: She is oriented to person, place, and time. She appears well-developed and well-nourished. She appears distressed.  Malodorous, assumed from vagina.   HENT:  Head: Normocephalic and atraumatic.  Eyes: EOM are normal. Pupils are equal, round, and reactive to light.  Neck: Normal range of motion.  Cardiovascular: Normal rate, regular rhythm and normal heart sounds.   Pulmonary/Chest: Effort normal and breath sounds normal.  Abdominal: Soft.  Musculoskeletal: Normal range of motion.  Neurological: She is alert and oriented to person, place, and time.  Skin: Skin is warm and dry.  Psychiatric: She has a normal mood and affect.  :   Monitoring Type:Doppler Q 8hrs Time:0423 FHR: 155 UC: None graphed  Assessment IUP at [redacted]w[redacted]d Previable Fetus S/P Cerclage Suspected infection-Elevated White Count of 21.8 Suspected PPROM   Plan Reassurances given that medication given will not harm fetus; encouraged to take medication to help with pain and tylenol to help with fever as patient feels warm-Patient would like to take all precautions and declines, but is accepting of IV antibiotics.  Patient questioned on why she did not present next day (8/13) as discussed during 8/12 phone call.  Patient states symptoms had resolved and she "  had not thought about it until I noticed the smell." Discussed [facility] MD recommendation for removal of cerclage due to  suspected PPROM and infection; patient continues to decline  Informed of MFM consult for recommendation and informed that they will be able to provide insight on fetal outlook Informed that NICU consult will not be placed as fetus is "too young to survive due to underdeveloped lungs." Continue current plan of care Upcoming Treatments/Tests: MFM Consult Dr.S. Rivard updated and to follow as appropriate  Kadesia Robel Larita Fife, MSN, CNM 05/02/2015, 8:34 AM

## 2015-05-02 NOTE — Progress Notes (Addendum)
S: Nurse call reports patient c/o vaginal pressure and pain.  States she was given nubain about one and half hours ago.  In room to discuss with patient.  Confirms feelings of vaginal pressure.  Declines intervention.  O:  Filed Vitals:   05/02/15 1141  BP: 153/87  Pulse: 103  Temp: 98 F (36.7 C)  Resp: 18   A: IUP at 19wks PPROM S/P Cerclage-Intact  P: Nurse instructed to keep patient comfortable Defer exam at current as patient cerclage remains in place Will increase nubain dosing to every 1 hour Will await for MFM consult Will update Dr. Letha Cape, Ajai Harville LYNN  MSN, CNM 05/02/2015 12:06 PM

## 2015-05-02 NOTE — Progress Notes (Signed)
Chaplain informed by Nursing Unit of patient's admission on labor and delivery.Chaplain reported to that unit to inquire on the medical status of the patient, at that time there was no pastoral presence requested. Chaplain will follow up as needed, and make referral to on-call chaplain. Chaplain Janell Quiet [phone]

## 2015-05-02 NOTE — Progress Notes (Signed)
MD at Pt bedside, sterile speculum exam performed, specimens collected.
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
## 2015-05-02 NOTE — Consult Note (Signed)
Maternal Fetal Medicine Consultation  Requesting Provider(s): Kayla Browns, MD  Primary OB: [facility] Reason for consultation: IUP at 19 weeks s/p rescue cerclage with hour-glassing membranes  HPI: Kayla Richard is a 24 yo G3P0020, EDD 09/26/2015 who is currently at 19w 0d - admitted due to hour-glassing membranes and heavy vaginal discharge.  Kayla Richard underwent a rescue cerclage on 7/29 - at the time, the cervical length was 3 mm with funneling and cervical debris.  She was observed in-house for approximately 72 hours and has been followed as an outpatient since that time.  She is currently on vaginal progesterone.  Kayla Richard presented to [facility] early this AM with heavy, foul-smelling discharge and abdominal cramping.  On ultrasound, fetal membranes were noted beyond the cerclage into the vagina.  On speculum exam, the cervix is open 2-3 cm with a bulging bag of water and "opaque" membranes.  This is a very much wanted pregnancy - Dr. Sallye Richard counseled the patient that the cerclage should be removed but up to this point, the patient has declined thin intervention.  I evaluated the patient for further counseling and a second opinion.    Since admission, the patient has been on IV Unasyn and has required Kayla Richard for abdominal pain.  OB History: OB History    Gravida Para Term Preterm AB TAB SAB Ectopic Multiple Living   G1 - TAB G2- 18 week loss, PROM, delivered at home  PMH:  Past Medical History  Diagnosis Date  . Cyst of breast     PSH:  Past Surgical History  Procedure Laterality Date  . Cervical cerclage N/A 04/15/2015    Procedure: CERCLAGE CERVICAL;  Surgeon: Kayla Graff, MD;  Location: [facility];  Service: Gynecology;  Laterality: N/A;   Meds:  Scheduled Meds: . ampicillin-sulbactam (UNASYN) IV  3 g Intravenous Q6H  . docusate sodium  100 mg Oral Daily  . prenatal multivitamin  1 tablet Oral Q1200   Continuous Infusions: . lactated ringers 125 mL/hr at 05/02/15 0414    PRN Meds:.acetaminophen, calcium carbonate, nalbuphine, zolpidem  Allergies: No Known Allergies   FH: History reviewed. No pertinent family history.   Soc:  Social History   Social History  . Marital Status: Single    Spouse Name: N/A  . Number of Children: N/A  . Years of Education: N/A   Occupational History  . Not on file.   Social History Main Topics  . Smoking status: Former Games developer  . Smokeless tobacco: Not on file  . Alcohol Use: No  . Drug Use: No  . Sexual Activity: Yes    Birth Control/ Protection: None   Other Topics Concern  . Not on file   Social History Narrative    Review of Systems: no vaginal bleeding or cramping/contractions, no LOF, no nausea/vomiting. All other systems reviewed and are negative.  PNL:   PE:   Filed Vitals:   05/02/15 1220  BP: 138/71  Pulse: 107  Temp:   Resp:     GEN: well-appearing female ABD: gravid, NT  Please see separate document for fetal ultrasound report.  Labs: CBC    Component Value Date/Time   WBC 21.8* 05/01/2015 2333   RBC 3.70* 05/01/2015 2333   HGB 11.1* 05/01/2015 2333   HCT 32.8* 05/01/2015 2333   PLT 210 05/01/2015 2333   MCV 88.6 05/01/2015 2333   MCH 30.0 05/01/2015 2333   MCHC 33.8 05/01/2015  2333   RDW 13.2 05/01/2015 2333   LYMPHSABS 2.4 05/01/2015 2333   MONOABS 1.1* 05/01/2015 2333   EOSABS 0.2 05/01/2015 2333   BASOSABS 0.0 05/01/2015 2333   Amnisure - negative Wet prep - sheets of WBCs.   GC/CHL pending  A/P: 1) Single IUP at 19w 0d  2) Cervical insufficiency s/p rescue cerclage   3) Hour glassing membranes into the vagina, suspected intra-amniotic infection - I had a long conversation with the couple regarding my concerns.  Given leukocytosis (WBC of 21,000), abdominal pain, foul vaginal discharge and sheets of WBCs on wet prep, I would concur with Dr. Sallye Richard that the cerclage should be removed.  I explained to her that there is no role for tocolytics given these findings and  that they would likely not work even if this were an option.  We had a discussion regarding the treatment of suspected intra-amniotic infection.  They are aware that IV antibiotics are not sufficient to adequately treat the infection and that to fully treat chorioamnionitis, the uterus needs to be evacuated. I also explained that if the cerclage is not removed, she could sustain cervical injury as she goes into labor. My suspicion is that once the cerclage is removed, she will likely deliver very rapidly thereafter.  While the patient has not yet developed any fevers, I explained to her that by the time that happens, she could become very ill.  The patient had concerns that she developed an infection because of the progesterone - I tried to explain that this likely had nothing to do with the infection, but that with exposed membranes in the vagina she would be at very high risk for infection.  While the patient seemed to accept my explanation, I am not sure that she is willing to have the cerclage removed at this time.  If the patient is ultimately willing to have the cerclage removed, would also recommend induction of labor (misoprostol) if she does not deliver rapidly thereafter.  Unfortunately, I do not think that she would be willing to have an induction of labor at this time, but would very strongly recommend this if she develops fevers.   Thank you for the opportunity to be a part of the care of Kayla Richard. Please contact our office if we can be of further assistance.   My recommendations were discussed with Dr. Estanislado Richard.  I spent approximately 30 minutes with this patient with over 50% of time spent in face-to-face counseling.  Kayla Gula, MD Maternal Fetal Medicine

## 2015-05-03 LAB — RPR: RPR: NONREACTIVE

## 2015-05-03 LAB — CBC
HCT: 30.1 % — ABNORMAL LOW (ref 36.0–46.0)
Hemoglobin: 10.4 g/dL — ABNORMAL LOW (ref 12.0–15.0)
MCH: 30.5 pg (ref 26.0–34.0)
MCHC: 34.6 g/dL (ref 30.0–36.0)
MCV: 88.3 fL (ref 78.0–100.0)
PLATELETS: 199 10*3/uL (ref 150–400)
RBC: 3.41 MIL/uL — AB (ref 3.87–5.11)
RDW: 13 % (ref 11.5–15.5)
WBC: 22.3 10*3/uL — ABNORMAL HIGH (ref 4.0–10.5)

## 2015-05-03 LAB — CULTURE, OB URINE

## 2015-05-03 LAB — GC/CHLAMYDIA PROBE AMP (~~LOC~~) NOT AT ARMC
Chlamydia: NEGATIVE
NEISSERIA GONORRHEA: NEGATIVE

## 2015-05-03 MED ORDER — FERROUS SULFATE 325 (65 FE) MG PO TABS: 325.0000 mg | ORAL_TABLET | Freq: Two times a day (BID) | ORAL | Status: DC

## 2015-05-03 MED ORDER — DOXYCYCLINE HYCLATE 100 MG PO CAPS: 100.0000 mg | ORAL_CAPSULE | Freq: Two times a day (BID) | ORAL | Status: DC

## 2015-05-03 MED ORDER — IBUPROFEN 800 MG PO TABS: 800.0000 mg | ORAL_TABLET | Freq: Three times a day (TID) | ORAL | Status: DC | PRN

## 2015-05-03 NOTE — Progress Notes (Signed)
Chaplain paged to say that Ms Kayla Richard wishes to talk to a chaplain when she awakes. Chaplain on stand-by for a page. If the night chaplain is not paged, information will be passed to the daytime chaplain.   Benjie Karvonen. Saquoia Sianez, DMin Chaplain

## 2015-05-03 NOTE — Discharge Summary (Addendum)
Vaginal Delivery Discharge Summary  Kayla Richard  DOB:    11-29-1990 MRN:    [id number] CSN:    [id number]  Date of admission:                  05/01/2015  Date of discharge:                   05/03/2015  Procedures this admission:  Date of Delivery: 05/02/2015 removal of cerclage by Dr. Estanislado Pandy                             05/02/2015 vaginal delivery by Clyde Lundborg, certified nurse midwife  Newborn Data:  Live born female  Birth Weight: 8.6 oz (244 g) APGAR: 2, 2; nonviable due to early gestation  Name: Carney Bern  History of Present Illness:  Kayla Richard is a 24 y.o. female, [id number], who presents at [redacted]w[redacted]d weeks gestation. The patient has been followed at the [facility] and Gynecology division of [facility]. She was admitted With abdominal pain and vaginal discharge. Her pregnancy has been complicated by: An incompetent cervix. In July 2016 a rescue cerclage was placed when the patient cervix was noted to be 5 mm thick. She has been on progesterone suppositories.  Hospital course:  The patient was admitted for vaginal discharge and abdominal pain. An ultrasound showed that membranes were protruding beyond the cerclage. The patient experienced abdominal pain that was consistent with chorioamnionitis. The patient was started on antibodies. A maternal fetal medicine consult was obtained. It was recommended that the cerclage be removed and that the patient be delivered. Initially the patient was not in favor of delivery. However, she realized that she would not be able to maintain this pregnancy to viability. The cerclage was removed on 05/02/2015. Her membranes ruptured quickly thereafter. She delivered a female infant without apparent anomaly. The patient was appropriately distressed. Her post partum course was uneventful. She grieved appropriately. A social worker consult was obtained. The patient was felt to be ready for discharge on  05/03/2015.  Contraception:  no method  Discharge hemoglobin:  HEMOGLOBIN  Date Value Ref Range Status  05/03/2015 10.4* 12.0 - 15.0 g/dL Final   HCT  Date Value Ref Range Status  05/03/2015 30.1* 36.0 - 46.0 % Final    Discharge Physical Exam:   General: alert and no distress Lochia: appropriate Uterine Fundus: firm Incision: Not applicable DVT Evaluation: No evidence of DVT seen on physical exam.  Intrapartum Procedures: spontaneous vaginal delivery Postpartum Procedures: antibiotics Complications-Operative and Postpartum: none  Discharge Diagnoses: Incompetent cervix,[redacted] weeks gestation,  chorioamnionitis, preterm labor, obesity, anemia  Discharge Information:  Activity:           pelvic rest Diet:                routine Medications: Ibuprofen, Iron and Doxycycline Condition:      stable and improved Instructions:   Postpartum Care After Vaginal Delivery  After you deliver your newborn (postpartum period), the usual stay in the hospital is 24 72 hours. If there were problems with your labor or delivery, or if you have other medical problems, you might be in the hospital longer.  While you are in the hospital, you will receive help and instructions on how to care for yourself and your newborn during the postpartum period.  While you are in the hospital:  Be sure to tell your nurses  if you have pain or discomfort, as well as where you feel the pain and what makes the pain worse.  If you had an incision made near your vagina (episiotomy) or if you had some tearing during delivery, the nurses may put ice packs on your episiotomy or tear. The ice packs may help to reduce the pain and swelling.  If you are breastfeeding, you may feel uncomfortable contractions of your uterus for a couple of weeks. This is normal. The contractions help your uterus get back to normal size.  It is normal to have some bleeding after delivery.  For the first 1 3 days after delivery, the  flow is red and the amount may be similar to a period.  It is common for the flow to start and stop.  In the first few days, you may pass some small clots. Let your nurses know if you begin to pass large clots or your flow increases.  Do not  flush blood clots down the toilet before having the nurse look at them.  During the next 3 10 days after delivery, your flow should become more watery and pink or brown-tinged in color.  Ten to fourteen days after delivery, your flow should be a small amount of yellowish-white discharge.  The amount of your flow will decrease over the first few weeks after delivery. Your flow may stop in 6 8 weeks. Most women have had their flow stop by 12 weeks after delivery.  You should change your sanitary pads frequently.  Wash your hands thoroughly with soap and water for at least 20 seconds after changing pads, using the toilet, or before holding or feeding your newborn.  You should feel like you need to empty your bladder within the first 6 8 hours after delivery.  In case you become weak, lightheaded, or faint, call your nurse before you get out of bed for the first time and before you take a shower for the first time.  Within the first few days after delivery, your breasts may begin to feel tender and full. This is called engorgement. Breast tenderness usually goes away within 48 72 hours after engorgement occurs. You may also notice milk leaking from your breasts. If you are not breastfeeding, do not stimulate your breasts. Breast stimulation can make your breasts produce more milk.  Spending as much time as possible with your newborn is very important. During this time, you and your newborn can feel close and get to know each other. Having your newborn stay in your room (rooming in) will help to strengthen the bond with your newborn. It will give you time to get to know your newborn and become comfortable caring for your newborn.  Your hormones change  after delivery. Sometimes the hormone changes can temporarily cause you to feel sad or tearful. These feelings should not last more than a few days. If these feelings last longer than that, you should talk to your caregiver.  If desired, talk to your caregiver about methods of family planning or contraception.  Talk to your caregiver about immunizations. Your caregiver may want you to have the following immunizations before leaving the hospital:  Tetanus, diphtheria, and pertussis (Tdap) or tetanus and diphtheria (Td) immunization. It is very important that you and your family (including grandparents) or others caring for your newborn are up-to-date with the Tdap or Td immunizations. The Tdap or Td immunization can help protect your newborn from getting ill.  Rubella immunization.  Varicella (chickenpox) immunization.  Influenza immunization. You should receive this annual immunization if you did not receive the immunization during your pregnancy. Document Released: 07/01/2007 Document Revised: 05/28/2012 Document Reviewed: 04/30/2012 [facility] Patient Information 2014 [place], [place].   Postpartum Depression and Baby Blues  The postpartum period begins right after the birth of a baby. During this time, there is often a great amount of joy and excitement. It is also a time of considerable changes in the life of the parent(s). Regardless of how many times a mother gives birth, each child brings new challenges and dynamics to the family. It is not unusual to have feelings of excitement accompanied by confusing shifts in moods, emotions, and thoughts. All mothers are at risk of developing postpartum depression or the "baby blues." These mood changes can occur right after giving birth, or they may occur many months after giving birth. The baby blues or postpartum depression can be mild or severe. Additionally, postpartum depression can resolve rather quickly, or it can be a long-term  condition. CAUSES Elevated hormones and their rapid decline are thought to be a main cause of postpartum depression and the baby blues. There are a number of hormones that radically change during and after pregnancy. Estrogen and progesterone usually decrease immediately after delivering your baby. The level of thyroid hormone and various cortisol steroids also rapidly drop. Other factors that play a major role in these changes include major life events and genetics.  RISK FACTORS If you have any of the following risks for the baby blues or postpartum depression, know what symptoms to watch out for during the postpartum period. Risk factors that may increase the likelihood of getting the baby blues or postpartum depression include: 1. Havinga personal or family history of depression. 2. Having depression while being pregnant. 3. Having premenstrual or oral contraceptive-associated mood issues. 4. Having exceptional life stress. 5. Having marital conflict. 6. Lacking a social support network. 7. Having a baby with special needs. 8. Having health problems such as diabetes. SYMPTOMS Baby blues symptoms include:  Brief fluctuations in mood, such as going from extreme happiness to sadness.  Decreased concentration.  Difficulty sleeping.  Crying spells, tearfulness.  Irritability.  Anxiety. Postpartum depression symptoms typically begin within the first month after giving birth. These symptoms include:  Difficulty sleeping or excessive sleepiness.  Marked weight loss.  Agitation.  Feelings of worthlessness.  Lack of interest in activity or food. Postpartum psychosis is a very concerning condition and can be dangerous. Fortunately, it is rare. Displaying any of the following symptoms is cause for immediate medical attention. Postpartum psychosis symptoms include:  Hallucinations and delusions.  Bizarre or disorganized behavior.  Confusion or disorientation. DIAGNOSIS  A  diagnosis is made by an evaluation of your symptoms. There are no medical or lab tests that lead to a diagnosis, but there are various questionnaires that a caregiver may use to identify those with the baby blues, postpartum depression, or psychosis. Often times, a screening tool called the [place] Postnatal Depression Scale is used to diagnose depression in the postpartum period.  TREATMENT The baby blues usually goes away on its own in 1 to 2 weeks. Social support is often all that is needed. You should be encouraged to get adequate sleep and rest. Occasionally, you may be given medicines to help you sleep.  Postpartum depression requires treatment as it can last several months or longer if it is not treated. Treatment may include individual or group therapy, medicine, or both to address any social, physiological, and  psychological factors that may play a role in the depression. Regular exercise, a healthy diet, rest, and social support may also be strongly recommended.  Postpartum psychosis is more serious and needs treatment right away. Hospitalization is often needed. HOME CARE INSTRUCTIONS  Get as much rest as you can. Nap when the baby sleeps.  Exercise regularly. Some women find yoga and walking to be beneficial.  Eat a balanced and nourishing diet.  Do little things that you enjoy. Have a cup of tea, take a bubble bath, read your favorite magazine, or listen to your favorite music.  Avoid alcohol.  Ask for help with household chores, cooking, grocery shopping, or running errands as needed. Do not try to do everything.  Talk to people close to you about how you are feeling. Get support from your partner, family members, friends, or other new moms.  Try to stay positive in how you think. Think about the things you are grateful for.  Do not spend a lot of time alone.  Only take medicine as directed by your caregiver.  Keep all your postpartum appointments.  Let your caregiver  know if you have any concerns. SEEK MEDICAL CARE IF: You are having a reaction or problems with your medicine. SEEK IMMEDIATE MEDICAL CARE IF:  You have suicidal feelings.  You feel you may harm the baby or someone else. Document Released: 06/07/2004 Document Revised: 11/26/2011 Document Reviewed: 07/10/2011 [facility] Patient Information 2014 [place], [place].   Discharge to: home  Follow-up Information    Follow up with [facility] In 1 week.   Contact information:   [address] [place] [address] [place] [address] [phone]       Janine Limbo 05/03/2015
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
## 2015-05-03 NOTE — Discharge Instructions (Signed)
Grief Reaction Grief is a normal response to the death of someone close to you. Feelings of fear, anger, and guilt can affect almost everyone who loses someone they love. Symptoms of depression are also common. These include problems with sleep, loss of appetite, and lack of energy. These grief reaction symptoms often last for weeks to months after a loss. They may also return during special times that remind you of the person you lost, such as an anniversary or birthday. Anxiety, insomnia, irritability, and deep depression may last beyond the period of normal grief. If you experience these feelings for 6 months or longer, you may have clinical depression. Clinical depression requires further medical attention. If you think that you have clinical depression, you should contact your caregiver. If you have a history of depression or a family history of depression, you are at greater risk of clinical depression. You are also at greater risk of developing clinical depression if the loss was traumatic or the loss was of someone with whom you had unresolved issues.  A grief reaction can become complicated by being blocked. This means being unable to cry or express extreme emotions. This may prolong the grieving period and worsen the emotional effects of the loss. Mourning is a natural event in human life. A healthy grief reaction is one that is not blocked. It requires a time of sadness and readjustment. It is very important to share your sorrow and fear with others, especially close friends and family. Professional counselors and clergy can also help you process your grief. Document Released: 09/03/2005 Document Revised: 01/18/2014 Document Reviewed: 05/14/2006 [employer] Patient Information 2015 [employer]. This information is not intended to replace advice given to you by your health care provider. Make sure you discuss any questions you have with your health care provider.  
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
## 2015-05-03 NOTE — Progress Notes (Signed)
Pt ambulated out  Teaching complete  Prescriptions given to pt   Pt  Has chosen [facility]  For cremation services

## 2015-05-04 LAB — RUBELLA SCREEN: Rubella: 2.1 index (ref >0.99)

## 2015-05-23 ENCOUNTER — Encounter (HOSPITAL_COMMUNITY): Payer: Self-pay | Admitting: Emergency Medicine

## 2015-05-23 ENCOUNTER — Emergency Department (HOSPITAL_COMMUNITY)
Admission: EM | Admit: 2015-05-23 | Discharge: 2015-05-23 | Disposition: A | Discharge status: Home / Self Care | Payer: Medicaid Other | Attending: Emergency Medicine | Admitting: Emergency Medicine

## 2015-05-23 DIAGNOSIS — N491 Inflammatory disorders of spermatic cord, tunica vaginalis and vas deferens: Secondary | ICD-10-CM

## 2015-05-23 DIAGNOSIS — N764 Abscess of vulva: Secondary | ICD-10-CM | POA: Insufficient documentation

## 2015-05-23 DIAGNOSIS — N390 Urinary tract infection, site not specified: Secondary | ICD-10-CM | POA: Diagnosis not present

## 2015-05-23 DIAGNOSIS — Z87891 Personal history of nicotine dependence: Secondary | ICD-10-CM | POA: Diagnosis not present

## 2015-05-23 LAB — URINE MICROSCOPIC-ADD ON

## 2015-05-23 LAB — URINALYSIS, ROUTINE W REFLEX MICROSCOPIC
Bilirubin Urine: NEGATIVE
Glucose, UA: NEGATIVE mg/dL
Ketones, ur: NEGATIVE mg/dL
NITRITE: NEGATIVE
Protein, ur: NEGATIVE mg/dL
SPECIFIC GRAVITY, URINE: 1.018 (ref 1.005–1.030)
UROBILINOGEN UA: 0.2 mg/dL (ref 0.0–1.0)
pH: 6 (ref 5.0–8.0)

## 2015-05-23 MED ORDER — LIDOCAINE HCL 2 % IJ SOLN
15.0000 mL | Freq: Once | INTRAMUSCULAR | Status: AC
  Administered 2015-05-23: 15 mL via INTRADERMAL
  Filled 2015-05-23: qty 20

## 2015-05-23 MED ORDER — SULFAMETHOXAZOLE-TRIMETHOPRIM 800-160 MG PO TABS: 1.0000 | ORAL_TABLET | Freq: Two times a day (BID) | ORAL | Status: AC

## 2015-05-23 NOTE — ED Provider Notes (Signed)
CSN: [id number]     Arrival date & time 05/23/15  0558 History   First MD Initiated Contact with Patient 05/23/15 [phone]     Chief Complaint  Patient presents with  . Abscess     (Consider location/radiation/quality/duration/timing/severity/associated sxs/prior Treatment) HPI Kayla Richard is a 24 y.o. female who comes in for evaluation of abscess. Patient states she noticed an abscess growing on the right side of her vagina. Reports her symptoms started 2 days ago and gradually worsened. She has tried hot compresses without relief. Palpation and certain sitting positions exacerbate the symptoms. She reports associated dysuria, but denies any unusual vaginal bleeding, discharge or other pelvic pain. No fevers, chills, chest pain, shortness of breath or abdominal pain, diarrhea or constipation, numbness or weakness. No other aggravating or modifying factors.  Past Medical History  Diagnosis Date  . Cyst of breast    Past Surgical History  Procedure Laterality Date  . Cervical cerclage N/A 04/15/2015    Procedure: CERCLAGE CERVICAL;  Surgeon: Jaymes Graff, MD;  Location: [facility];  Service: Gynecology;  Laterality: N/A;   History reviewed. No pertinent family history. Social History  Substance Use Topics  . Smoking status: Former Games developer  . Smokeless tobacco: None  . Alcohol Use: No   OB History    Gravida Para Term Preterm AB TAB SAB Ectopic Multiple Living   0 1     Review of Systems A 10 point review of systems was completed and was negative except for pertinent positives and negatives as mentioned in the history of present illness    Allergies  Review of patient's allergies indicates no known allergies.  Home Medications   Prior to Admission medications   Medication Sig Start Date End Date Taking? Authorizing Provider  acetaminophen (TYLENOL) 325 MG tablet Take 650 mg by mouth every 6 (six) hours as needed.   Yes Historical Provider, MD   sulfamethoxazole-trimethoprim (BACTRIM DS,SEPTRA DS) 800-160 MG per tablet Take 1 tablet by mouth 2 (two) times daily. 05/23/15 05/30/15  Kristee Angus, PA-C   BP 134/88 mmHg  Pulse 74  Temp(Src) 98.3 F (36.8 C) (Oral)  Resp 18  Ht 5' (1.524 m)  Wt 220 lb (99.791 kg)  BMI 42.97 kg/m2  SpO2 99% Physical Exam  Constitutional: She is oriented to person, place, and time. She appears well-developed and well-nourished.  HENT:  Head: Normocephalic and atraumatic.  Mouth/Throat: Oropharynx is clear and moist.  Eyes: Conjunctivae are normal. Pupils are equal, round, and reactive to light. Right eye exhibits no discharge. Left eye exhibits no discharge. No scleral icterus.  Neck: Neck supple.  Cardiovascular: Normal rate, regular rhythm and normal heart sounds.   Pulmonary/Chest: Effort normal and breath sounds normal. No respiratory distress. She has no wheezes. She has no rales.  Abdominal: Soft. There is no tenderness.  Genitourinary:  Female chaperone present for genitourinary exam. Abscess noted to the right labia majora. Not consistent with Bartholin's cyst. No mucosal involvement.  Musculoskeletal: She exhibits no tenderness.  Neurological: She is alert and oriented to person, place, and time.  Cranial Nerves II-XII grossly intact  Skin: Skin is warm and dry. No rash noted.  Psychiatric: She has a normal mood and affect.  Nursing note and vitals reviewed.   ED Course  Procedures (including critical care time)  INCISION AND DRAINAGE Performed by: Sharlene Motts Consent: Verbal consent obtained. Risks and benefits: risks, benefits and alternatives were discussed Type: abscess  Body area: Right labia majora  Anesthesia: local infiltration  Incision was made with a scalpel.  Local anesthetic: lidocaine 2 % without epinephrine  Anesthetic total: 3 ml  Complexity: complex Blunt dissection to break up loculations  Drainage: purulent  Drainage amount: Small to  moderate   Packing material: None   Patient tolerance: Patient tolerated the procedure well with no immediate complications.    Labs Review Labs Reviewed  URINALYSIS, ROUTINE W REFLEX MICROSCOPIC (NOT AT [facility]) - Abnormal; Notable for the following:    APPearance CLOUDY (*)    Hgb urine dipstick MODERATE (*)    Leukocytes, UA MODERATE (*)    All other components within normal limits  URINE MICROSCOPIC-ADD ON - Abnormal; Notable for the following:    Squamous Epithelial / LPF FEW (*)    Bacteria, UA MANY (*)    All other components within normal limits    Imaging Review No results found. I have personally reviewed and evaluated these images and lab results as part of my medical decision-making.   EKG Interpretation None     Meds given in ED:  Medications  lidocaine (XYLOCAINE) 2 % (with pres) injection 300 mg (15 mLs Intradermal Given 05/23/15 0847)    Discharge Medication List as of 05/23/2015  8:45 AM    START taking these medications   Details  sulfamethoxazole-trimethoprim (BACTRIM DS,SEPTRA DS) 800-160 MG per tablet Take 1 tablet by mouth 2 (two) times daily., Starting 05/23/2015, Until Mon 05/30/15, Print       Filed Vitals:   05/23/15 0604 05/23/15 0854  BP: 172/93 134/88  Pulse: 67 74  Temp: 98.3 F (36.8 C)   TempSrc: Oral   Resp: 17 18  Height: 5' (1.524 m)   Weight: 220 lb (99.791 kg)   SpO2: 99% 99%    MDM  Vitals stable - WNL -afebrile Pt resting comfortably in ED. PE--physical exam as above. Consistent with a vaginal abscess. Labwork--patient also has evidence of UTI on urinalysis.  Abscess drained at bedside by myself. Patient refused packing material. We'll initiate antibiotics due to location of abscess as well as evidence of UTI. No evidence of other acute or emergent pathology at this time. Given referral to [facility] and wellness in order to establish primary care for follow-up.  I discussed all relevant lab findings and imaging results  with pt and they verbalized understanding. Discussed f/u with PCP within 48 hrs and return precautions, pt very amenable to plan.  Final diagnoses:  Abscess of tunica vaginalis  UTI (lower urinary tract infection)     Joycie Peek, PA-C 05/23/15 1109  Loren Racer, MD 05/25/15 [phone]

## 2015-05-23 NOTE — ED Notes (Signed)
Pt reports vaginal abscess x 2 days on the RIGHT labia majora that is "very painful"; pt reports using warm compresses without improvement

## 2015-05-23 NOTE — Discharge Instructions (Signed)
You need to keep your abscess area clean and dry. Take your antibiotics as prescribed. Return for reevaluation if increased redness, fevers, chills. You will continue to have drainage from this area and that is normal. Follow-up with health and wellness for reevaluation and primary care. Return to ED for worsening symptoms.  Abscess An abscess is an infected area that contains a collection of pus and debris.It can occur in almost any part of the body. An abscess is also known as a furuncle or boil. CAUSES  An abscess occurs when tissue gets infected. This can occur from blockage of oil or sweat glands, infection of hair follicles, or a minor injury to the skin. As the body tries to fight the infection, pus collects in the area and creates pressure under the skin. This pressure causes pain. People with weakened immune systems have difficulty fighting infections and get certain abscesses more often.  SYMPTOMS Usually an abscess develops on the skin and becomes a painful mass that is red, warm, and tender. If the abscess forms under the skin, you may feel a moveable soft area under the skin. Some abscesses break open (rupture) on their own, but most will continue to get worse without care. The infection can spread deeper into the body and eventually into the bloodstream, causing you to feel ill.  DIAGNOSIS  Your caregiver will take your medical history and perform a physical exam. A sample of fluid may also be taken from the abscess to determine what is causing your infection. TREATMENT  Your caregiver may prescribe antibiotic medicines to fight the infection. However, taking antibiotics alone usually does not cure an abscess. Your caregiver may need to make a small cut (incision) in the abscess to drain the pus. In some cases, gauze is packed into the abscess to reduce pain and to continue draining the area. HOME CARE INSTRUCTIONS   Only take over-the-counter or prescription medicines for pain,  discomfort, or fever as directed by your caregiver.  If you were prescribed antibiotics, take them as directed. Finish them even if you start to feel better.  If gauze is used, follow your caregiver's directions for changing the gauze.  To avoid spreading the infection:  Keep your draining abscess covered with a bandage.  Wash your hands well.  Do not share personal care items, towels, or whirlpools with others.  Avoid skin contact with others.  Keep your skin and clothes clean around the abscess.  Keep all follow-up appointments as directed by your caregiver. SEEK MEDICAL CARE IF:   You have increased pain, swelling, redness, fluid drainage, or bleeding.  You have muscle aches, chills, or a general ill feeling.  You have a fever. MAKE SURE YOU:   Understand these instructions.  Will watch your condition.  Will get help right away if you are not doing well or get worse. Document Released: 06/13/2005 Document Revised: 03/04/2012 Document Reviewed: 11/16/2011 [facility] Patient Information 2015 [place], [place]. This information is not intended to replace advice given to you by your health care provider. Make sure you discuss any questions you have with your health care provider.

## 2015-07-13 ENCOUNTER — Encounter (HOSPITAL_COMMUNITY): Payer: Self-pay | Admitting: Emergency Medicine

## 2015-07-13 ENCOUNTER — Emergency Department (HOSPITAL_COMMUNITY)
Admission: EM | Admit: 2015-07-13 | Discharge: 2015-07-13 | Disposition: A | Discharge status: Home / Self Care | Payer: Medicaid Other | Attending: Emergency Medicine | Admitting: Emergency Medicine

## 2015-07-13 DIAGNOSIS — Z3202 Encounter for pregnancy test, result negative: Secondary | ICD-10-CM | POA: Diagnosis not present

## 2015-07-13 DIAGNOSIS — R112 Nausea with vomiting, unspecified: Secondary | ICD-10-CM | POA: Diagnosis present

## 2015-07-13 DIAGNOSIS — Z720 Tobacco use: Secondary | ICD-10-CM | POA: Diagnosis not present

## 2015-07-13 DIAGNOSIS — R1013 Epigastric pain: Secondary | ICD-10-CM | POA: Insufficient documentation

## 2015-07-13 DIAGNOSIS — Z8742 Personal history of other diseases of the female genital tract: Secondary | ICD-10-CM | POA: Insufficient documentation

## 2015-07-13 LAB — LIPASE, BLOOD: LIPASE: 34 U/L (ref 11–51)

## 2015-07-13 LAB — URINALYSIS, ROUTINE W REFLEX MICROSCOPIC
BILIRUBIN URINE: NEGATIVE
Glucose, UA: NEGATIVE mg/dL
Ketones, ur: NEGATIVE mg/dL
Leukocytes, UA: NEGATIVE
NITRITE: NEGATIVE
PH: 5.5 (ref 5.0–8.0)
Protein, ur: NEGATIVE mg/dL
SPECIFIC GRAVITY, URINE: 1.027 (ref 1.005–1.030)
Urobilinogen, UA: 0.2 mg/dL (ref 0.0–1.0)

## 2015-07-13 LAB — COMPREHENSIVE METABOLIC PANEL
ALT: 13 U/L — ABNORMAL LOW (ref 14–54)
ANION GAP: 10 (ref 5–15)
AST: 16 U/L (ref 15–41)
Albumin: 3.9 g/dL (ref 3.5–5.0)
Alkaline Phosphatase: 55 U/L (ref 38–126)
BUN: 7 mg/dL (ref 6–20)
CALCIUM: 8.9 mg/dL (ref 8.9–10.3)
CHLORIDE: 105 mmol/L (ref 101–111)
CO2: 24 mmol/L (ref 22–32)
Creatinine, Ser: 0.76 mg/dL (ref 0.44–1.00)
GFR calc non Af Amer: >60 mL/min (ref >60)
Glucose, Bld: 86 mg/dL (ref 65–99)
POTASSIUM: 3.7 mmol/L (ref 3.5–5.1)
SODIUM: 139 mmol/L (ref 135–145)
Total Bilirubin: 0.7 mg/dL (ref 0.3–1.2)
Total Protein: 7.4 g/dL (ref 6.5–8.1)

## 2015-07-13 LAB — I-STAT BETA HCG BLOOD, ED (MC, WL, AP ONLY): I-stat hCG, quantitative: <5 m[IU]/mL (ref <5)

## 2015-07-13 LAB — CBC
HCT: 38.8 % (ref 36.0–46.0)
HEMOGLOBIN: 12.6 g/dL (ref 12.0–15.0)
MCH: 28.4 pg (ref 26.0–34.0)
MCHC: 32.5 g/dL (ref 30.0–36.0)
MCV: 87.4 fL (ref 78.0–100.0)
Platelets: 202 10*3/uL (ref 150–400)
RBC: 4.44 MIL/uL (ref 3.87–5.11)
RDW: 13 % (ref 11.5–15.5)
WBC: 10.1 10*3/uL (ref 4.0–10.5)

## 2015-07-13 LAB — URINE MICROSCOPIC-ADD ON

## 2015-07-13 MED ORDER — ONDANSETRON 4 MG PO TBDP: 4.0000 mg | ORAL_TABLET | Freq: Three times a day (TID) | ORAL | Status: DC | PRN

## 2015-07-13 MED ORDER — ONDANSETRON 4 MG PO TBDP
4.0000 mg | ORAL_TABLET | Freq: Once | ORAL | Status: AC
  Administered 2015-07-13: 4 mg via ORAL
  Filled 2015-07-13: qty 1

## 2015-07-13 NOTE — ED Notes (Signed)
Pt is in stable condition upon d/c and ambulates from ED. 

## 2015-07-13 NOTE — ED Notes (Signed)
Pt sts abd pain and N/V today; pt sts LMP was 9/20

## 2015-07-13 NOTE — Discharge Instructions (Signed)
Abdominal Pain, Adult °Many things can cause abdominal pain. Usually, abdominal pain is not caused by a disease and will improve without treatment. It can often be observed and treated at home. Your health care provider will do a physical exam and possibly order blood tests and X-rays to help determine the seriousness of your pain. However, in many cases, more time must pass before a clear cause of the pain can be found. Before that point, your health care provider may not know if you need more testing or further treatment. °HOME CARE INSTRUCTIONS °Monitor your abdominal pain for any changes. The following actions may help to alleviate any discomfort you are experiencing: °· Only take over-the-counter or prescription medicines as directed by your health care provider. °· Do not take laxatives unless directed to do so by your health care provider. °· Try a clear liquid diet (broth, tea, or water) as directed by your health care provider. Slowly move to a bland diet as tolerated. °SEEK MEDICAL CARE IF: °· You have unexplained abdominal pain. °· You have abdominal pain associated with nausea or diarrhea. °· You have pain when you urinate or have a bowel movement. °· You experience abdominal pain that wakes you in the night. °· You have abdominal pain that is worsened or improved by eating food. °· You have abdominal pain that is worsened with eating fatty foods. °· You have a fever. °SEEK IMMEDIATE MEDICAL CARE IF: °· Your pain does not go away within 2 hours. °· You keep throwing up (vomiting). °· Your pain is felt only in portions of the abdomen, such as the right side or the left lower portion of the abdomen. °· You pass bloody or black tarry stools. °MAKE SURE YOU: °· Understand these instructions. °· Will watch your condition. °· Will get help right away if you are not doing well or get worse. °  °This information is not intended to replace advice given to you by your health care provider. Make sure you discuss  any questions you have with your health care provider. °  °Document Released: 06/13/2005 Document Revised: 05/25/2015 Document Reviewed: 05/13/2013 °Elsevier Interactive Patient Education ©2016 [employer] ° °Nausea and Vomiting °Nausea is a sick feeling that often comes before throwing up (vomiting). Vomiting is a reflex where stomach contents come out of your mouth. Vomiting can cause severe loss of body fluids (dehydration). Children and elderly adults can become dehydrated quickly, especially if they also have diarrhea. Nausea and vomiting are symptoms of a condition or disease. It is important to find the cause of your symptoms. °CAUSES  °· Direct irritation of the stomach lining. This irritation can result from increased acid production (gastroesophageal reflux disease), infection, food poisoning, taking certain medicines (such as nonsteroidal anti-inflammatory drugs), alcohol use, or tobacco use. °· Signals from the brain. These signals could be caused by a headache, heat exposure, an inner ear disturbance, increased pressure in the brain from injury, infection, a tumor, or a concussion, pain, emotional stimulus, or metabolic problems. °· An obstruction in the gastrointestinal tract (bowel obstruction). °· Illnesses such as diabetes, hepatitis, gallbladder problems, appendicitis, kidney problems, cancer, sepsis, atypical symptoms of a heart attack, or eating disorders. °· Medical treatments such as chemotherapy and radiation. °· Receiving medicine that makes you sleep (general anesthetic) during surgery. °DIAGNOSIS °Your caregiver may ask for tests to be done if the problems do not improve after a few days. Tests may also be done if symptoms are severe or if the reason for the   nausea and vomiting is not clear. Tests may include: °· Urine tests. °· Blood tests. °· Stool tests. °· Cultures (to look for evidence of infection). °· X-rays or other imaging studies. °Test results can help your caregiver make  decisions about treatment or the need for additional tests. °TREATMENT °You need to stay well hydrated. Drink frequently but in small amounts. You may wish to drink water, sports drinks, clear broth, or eat frozen ice pops or gelatin dessert to help stay hydrated. When you eat, eating slowly may help prevent nausea. There are also some antinausea medicines that may help prevent nausea. °HOME CARE INSTRUCTIONS  °· Take all medicine as directed by your caregiver. °· If you do not have an appetite, do not force yourself to eat. However, you must continue to drink fluids. °· If you have an appetite, eat a normal diet unless your caregiver tells you differently. °¨ Eat a variety of complex carbohydrates (rice, wheat, potatoes, bread), lean meats, yogurt, fruits, and vegetables. °¨ Avoid high-fat foods because they are more difficult to digest. °· Drink enough water and fluids to keep your urine clear or pale yellow. °· If you are dehydrated, ask your caregiver for specific rehydration instructions. Signs of dehydration may include: °¨ Severe thirst. °¨ Dry lips and mouth. °¨ Dizziness. °¨ Dark urine. °¨ Decreasing urine frequency and amount. °¨ Confusion. °¨ Rapid breathing or pulse. °SEEK IMMEDIATE MEDICAL CARE IF:  °· You have blood or brown flecks (like coffee grounds) in your vomit. °· You have black or bloody stools. °· You have a severe headache or stiff neck. °· You are confused. °· You have severe abdominal pain. °· You have chest pain or trouble breathing. °· You do not urinate at least once every 8 hours. °· You develop cold or clammy skin. °· You continue to vomit for longer than 24 to 48 hours. °· You have a fever. °MAKE SURE YOU:  °· Understand these instructions. °· Will watch your condition. °· Will get help right away if you are not doing well or get worse. °  °This information is not intended to replace advice given to you by your health care provider. Make sure you discuss any questions you have with  your health care provider. °  °Document Released: 09/03/2005 Document Revised: 11/26/2011 Document Reviewed: 01/31/2011 °Elsevier Interactive Patient Education ©2016 Elsevier Inc[employer] ° °

## 2015-07-13 NOTE — ED Provider Notes (Signed)
CSN: [id number]     Arrival date & time 07/13/15  1531 History  By signing my name below, I, Lyndel Safe, attest that this documentation has been prepared under the direction and in the presence of Teressa Lower, NP. Electronically Signed: Lyndel Safe, ED Scribe. 07/13/2015. 4:44 PM.   Chief Complaint  Patient presents with  . Abdominal Pain  . Emesis   The history is provided by the patient. No language interpreter was used.   HPI Comments: [facility]la [facility] is a 24 y.o. female, with no chronic medical conditions, who presents to the Emergency Department complaining of intermittent epigastric abdominal pain onset with waking up this morning with associated nausea and vomiting. She has not taken any alleviating medication for her symptoms. Denies a similar experience in the past, any urinary symptoms, diarrhea, fevers or chills. LNMP was 9/21.   Past Medical History  Diagnosis Date  . Cyst of breast    Past Surgical History  Procedure Laterality Date  . Cervical cerclage N/A 04/15/2015    Procedure: CERCLAGE CERVICAL;  Surgeon: Jaymes Graff, MD;  Location: [facility];  Service: Gynecology;  Laterality: N/A;   History reviewed. No pertinent family history. Social History  Substance Use Topics  . Smoking status: Current Every Day Smoker  . Smokeless tobacco: None  . Alcohol Use: No   OB History    Gravida Para Term Preterm AB TAB SAB Ectopic Multiple Living   0 1     Review of Systems  Constitutional: Negative for fever and chills.  Gastrointestinal: Positive for nausea, vomiting and abdominal pain. Negative for diarrhea.  Genitourinary: Negative for dysuria, urgency and frequency.  All other systems reviewed and are negative.  Allergies  Review of patient's allergies indicates no known allergies.  Home Medications   Prior to Admission medications   Medication Sig Start Date End Date Taking? Authorizing Provider  acetaminophen (TYLENOL) 325 MG tablet  Take 650 mg by mouth every 6 (six) hours as needed for mild pain.    Yes Historical Provider, MD   BP 153/91 mmHg  Pulse 93  Temp(Src) 98.6 F (37 C) (Oral)  Resp 18  SpO2 99% Physical Exam  Constitutional: She is oriented to person, place, and time. She appears well-developed and well-nourished. No distress.  HENT:  Head: Normocephalic and atraumatic.  Eyes: Conjunctivae are normal.  Neck: Normal range of motion. Neck supple.  Cardiovascular: Normal rate.   Pulmonary/Chest: Effort normal. No respiratory distress.  Abdominal: Soft. Bowel sounds are normal. There is no tenderness. There is no rebound and no guarding.  Musculoskeletal: Normal range of motion.  Neurological: She is alert and oriented to person, place, and time. Coordination normal.  Skin: Skin is warm.  Psychiatric: She has a normal mood and affect. Her behavior is normal.  Nursing note and vitals reviewed.  ED Course  Procedures  DIAGNOSTIC STUDIES: Oxygen Saturation is 99% on RA, normal by my interpretation.    COORDINATION OF CARE: 4:43 PM Discussed treatment plan with pt at bedside and pt agreed to plan.   Labs Review Labs Reviewed  COMPREHENSIVE METABOLIC PANEL - Abnormal; Notable for the following:    ALT 13 (*)    All other components within normal limits  URINALYSIS, ROUTINE W REFLEX MICROSCOPIC (NOT AT [facility]) - Abnormal; Notable for the following:    APPearance CLOUDY (*)    Hgb urine dipstick SMALL (*)    All other components within normal limits  URINE MICROSCOPIC-ADD ON - Abnormal; Notable for the following:    Squamous Epithelial / LPF MANY (*)    All other components within normal limits  LIPASE, BLOOD  CBC  I-STAT BETA HCG BLOOD, ED ([facility], [facility], [facility] ONLY)  I have personally reviewed and evaluated these lab results as part of my medical decision-making.   MDM   Final diagnoses:  Nausea and vomiting, vomiting of unspecified type  Epigastric pain    Pt is tolerating po without any problem  after zofran. Think likely viral. Discussed follow up an return precautions  I personally performed the services described in this documentation, which was scribed in my presence. The recorded information has been reviewed and is accurate.    Teressa LowerVrinda Jamae Tison, NP 07/13/15 1804  Cathren LaineKevin Steinl, MD 07/13/15 [phone]

## 2015-11-28 ENCOUNTER — Encounter (HOSPITAL_COMMUNITY): Payer: Self-pay | Admitting: Vascular Surgery

## 2015-11-28 ENCOUNTER — Emergency Department (HOSPITAL_COMMUNITY): Payer: Medicaid Other

## 2015-11-28 DIAGNOSIS — F17200 Nicotine dependence, unspecified, uncomplicated: Secondary | ICD-10-CM | POA: Diagnosis not present

## 2015-11-28 DIAGNOSIS — R0602 Shortness of breath: Secondary | ICD-10-CM | POA: Insufficient documentation

## 2015-11-28 LAB — BASIC METABOLIC PANEL
ANION GAP: 10 (ref 5–15)
BUN: 11 mg/dL (ref 6–20)
CALCIUM: 9.4 mg/dL (ref 8.9–10.3)
CO2: 27 mmol/L (ref 22–32)
CREATININE: 0.94 mg/dL (ref 0.44–1.00)
Chloride: 104 mmol/L (ref 101–111)
Glucose, Bld: 95 mg/dL (ref 65–99)
Potassium: 4.2 mmol/L (ref 3.5–5.1)
SODIUM: 141 mmol/L (ref 135–145)

## 2015-11-28 LAB — CBC
HEMATOCRIT: 40.9 % (ref 36.0–46.0)
Hemoglobin: 13.5 g/dL (ref 12.0–15.0)
MCH: 28.8 pg (ref 26.0–34.0)
MCHC: 33 g/dL (ref 30.0–36.0)
MCV: 87.4 fL (ref 78.0–100.0)
PLATELETS: 226 10*3/uL (ref 150–400)
RBC: 4.68 MIL/uL (ref 3.87–5.11)
RDW: 13.4 % (ref 11.5–15.5)
WBC: 13.6 10*3/uL — AB (ref 4.0–10.5)

## 2015-11-28 LAB — I-STAT TROPONIN, ED: TROPONIN I, POC: 0 ng/mL (ref 0.00–0.08)

## 2015-11-28 NOTE — ED Notes (Signed)
Pt reports to the ED for eval of SOB and dry cough since last week. SOB is with exertion. Also reports some upper back pain and some chest tightness. Denies any N/V, lightheadedness, or dizziness. Pt A&OX4, resp e/u, and skin warm and dry.

## 2015-11-29 ENCOUNTER — Emergency Department (HOSPITAL_COMMUNITY)
Admission: EM | Admit: 2015-11-29 | Discharge: 2015-11-29 | Disposition: A | Discharge status: Home / Self Care | Payer: Medicaid Other | Attending: Emergency Medicine | Admitting: Emergency Medicine

## 2015-11-29 NOTE — ED Notes (Signed)
Called 3 times. No Answer 

## 2016-01-15 ENCOUNTER — Encounter (HOSPITAL_COMMUNITY): Payer: Self-pay | Admitting: *Deleted

## 2016-01-15 ENCOUNTER — Emergency Department (HOSPITAL_COMMUNITY)
Admission: EM | Admit: 2016-01-15 | Discharge: 2016-01-15 | Disposition: A | Discharge status: Home / Self Care | Payer: Medicaid Other | Attending: Emergency Medicine | Admitting: Emergency Medicine

## 2016-01-15 ENCOUNTER — Emergency Department (HOSPITAL_COMMUNITY): Payer: Medicaid Other

## 2016-01-15 DIAGNOSIS — F17200 Nicotine dependence, unspecified, uncomplicated: Secondary | ICD-10-CM | POA: Diagnosis not present

## 2016-01-15 DIAGNOSIS — R002 Palpitations: Secondary | ICD-10-CM | POA: Diagnosis present

## 2016-01-15 DIAGNOSIS — Z3202 Encounter for pregnancy test, result negative: Secondary | ICD-10-CM | POA: Insufficient documentation

## 2016-01-15 DIAGNOSIS — Z87448 Personal history of other diseases of urinary system: Secondary | ICD-10-CM | POA: Insufficient documentation

## 2016-01-15 DIAGNOSIS — R109 Unspecified abdominal pain: Secondary | ICD-10-CM | POA: Insufficient documentation

## 2016-01-15 DIAGNOSIS — Z79899 Other long term (current) drug therapy: Secondary | ICD-10-CM | POA: Insufficient documentation

## 2016-01-15 DIAGNOSIS — I159 Secondary hypertension, unspecified: Secondary | ICD-10-CM

## 2016-01-15 LAB — CBC
HCT: 40.6 % (ref 36.0–46.0)
Hemoglobin: 13.5 g/dL (ref 12.0–15.0)
MCH: 29.6 pg (ref 26.0–34.0)
MCHC: 33.3 g/dL (ref 30.0–36.0)
MCV: 89 fL (ref 78.0–100.0)
PLATELETS: 256 10*3/uL (ref 150–400)
RBC: 4.56 MIL/uL (ref 3.87–5.11)
RDW: 12.9 % (ref 11.5–15.5)
WBC: 9.6 10*3/uL (ref 4.0–10.5)

## 2016-01-15 LAB — BASIC METABOLIC PANEL
Anion gap: 8 (ref 5–15)
BUN: 7 mg/dL (ref 6–20)
CO2: 24 mmol/L (ref 22–32)
CREATININE: 0.88 mg/dL (ref 0.44–1.00)
Calcium: 9.2 mg/dL (ref 8.9–10.3)
Chloride: 108 mmol/L (ref 101–111)
Glucose, Bld: 117 mg/dL — ABNORMAL HIGH (ref 65–99)
POTASSIUM: 3.5 mmol/L (ref 3.5–5.1)
SODIUM: 140 mmol/L (ref 135–145)

## 2016-01-15 LAB — I-STAT TROPONIN, ED: TROPONIN I, POC: 0 ng/mL (ref 0.00–0.08)

## 2016-01-15 LAB — I-STAT BETA HCG BLOOD, ED (MC, WL, AP ONLY)

## 2016-01-15 MED ORDER — LISINOPRIL 20 MG PO TABS: 20.0000 mg | ORAL_TABLET | Freq: Every day | ORAL | Status: DC

## 2016-01-15 NOTE — ED Provider Notes (Signed)
CSN: [id number]     Arrival date & time 01/15/16  1528 History   First MD Initiated Contact with Patient 01/15/16 1950     Chief Complaint  Patient presents with  . Palpitations  . Headache  . Shoulder Pain     (Consider location/radiation/quality/duration/timing/severity/associated sxs/prior Treatment) HPI   Kayla Richard is a 25 y.o. female who presents for evaluation of ongoing symptoms of headache, dizziness, palpitations, shoulder pain, abdominal pain, and concern over elevated blood pressure. She states that her blood pressure and elevated on multiple prior emergency department evaluations. Yesterday, she took one of her boyfriends, lisinopril medications. She denies personal use, focal weakness, blurry vision or persistent chest pain. She has never been treated for hypertension.   Past Medical History  Diagnosis Date  . Cyst of breast    Past Surgical History  Procedure Laterality Date  . Cervical cerclage N/A 04/15/2015    Procedure: CERCLAGE CERVICAL;  Surgeon: Jaymes GraffNaima Dillard, MD;  Location: [facility];  Service: Gynecology;  Laterality: N/A;   No family history on file. Social History  Substance Use Topics  . Smoking status: Current Every Day Smoker  . Smokeless tobacco: None  . Alcohol Use: No   OB History    Gravida Para Term Preterm AB TAB SAB Ectopic Multiple Living   3 1   2 1 1   0 1     Review of Systems  All other systems reviewed and are negative.     Allergies  Review of patient's allergies indicates no known allergies.  Home Medications   Prior to Admission medications   Medication Sig Start Date End Date Taking? Authorizing Provider  acetaminophen (TYLENOL) 325 MG tablet Take 650 mg by mouth every 6 (six) hours as needed for mild pain.     Historical Provider, MD  lisinopril (PRINIVIL,ZESTRIL) 20 MG tablet Take 1 tablet (20 mg total) by mouth daily. 01/15/16   Mancel BaleElliott Latayna Ritchie, MD   BP 137/84 mmHg  Pulse 66  Temp(Src) 98.1 F (36.7 C) (Oral)   Resp 18  SpO2 97%  LMP 01/01/2016 Physical Exam  Constitutional: She is oriented to person, place, and time. She appears well-developed and well-nourished.  HENT:  Head: Normocephalic and atraumatic.  Right Ear: External ear normal.  Left Ear: External ear normal.  Eyes: Conjunctivae and EOM are normal. Pupils are equal, round, and reactive to light.  Neck: Normal range of motion and phonation normal. Neck supple.  Cardiovascular: Normal rate, regular rhythm and normal heart sounds.   Pulmonary/Chest: Effort normal and breath sounds normal. She exhibits no bony tenderness.  Abdominal: Soft. There is no tenderness.  Musculoskeletal: Normal range of motion.  Neurological: She is alert and oriented to person, place, and time. No cranial nerve deficit or sensory deficit. She exhibits normal muscle tone. Coordination normal.  Skin: Skin is warm, dry and intact.  Psychiatric: She has a normal mood and affect. Her behavior is normal. Judgment and thought content normal.  Nursing note and vitals reviewed.   ED Course  Procedures (including critical care time)  Initial clinical impression- mild hypertension. Will evaluate patient for signs and symptoms of an organ disease.  Medications - No data to display  Patient Vitals for the past 24 hrs:  BP Temp Temp src Pulse Resp SpO2  01/15/16 2015 137/84 mmHg - - 66 - 97 %  01/15/16 1945 154/85 mmHg - - 69 - 99 %  01/15/16 1541 (!) 163/120 mmHg 98.1 F (36.7 C) Oral 80 18  98 %    At discharge- Reevaluation with update and discussion. After initial assessment and treatment, an updated evaluation reveals , comfortable, no additional complaints. Findings discussed with patient, all questions were answered. Braden Cimo L    Labs Review Labs Reviewed  BASIC METABOLIC PANEL - Abnormal; Notable for the following:    Glucose, Bld 117 (*)    All other components within normal limits  CBC  I-STAT TROPOININ, ED  I-STAT BETA HCG BLOOD, ED ([facility],  [facility], [facility] ONLY)    Imaging Review Dg Chest 2 View  01/15/2016  CLINICAL DATA:  Left-sided chest pain. EXAM: CHEST  2 VIEW COMPARISON:  November 28, 2015. FINDINGS: The heart size and mediastinal contours are within normal limits. Both lungs are clear. No pneumothorax or pleural effusion is noted. The visualized skeletal structures are unremarkable. IMPRESSION: No active cardiopulmonary disease. Electronically Signed   By: Lupita Raider, M.D.   On: 01/15/2016 16:10   I have personally reviewed and evaluated these images and lab results as part of my medical decision-making.   EKG Interpretation   Date/Time:  Sunday January 15 2016 15:44:51 EDT Ventricular Rate:  75 PR Interval:  136 QRS Duration: 80 QT Interval:  386 QTC Calculation: 431 R Axis:   43 Text Interpretation:  Normal sinus rhythm with sinus arrhythmia Normal ECG  since last tracing no significant change Confirmed by Effie Shy  MD, Mechele Collin  ([id number]) on 01/15/2016 7:51:25 PM      MDM   Final diagnoses:  Secondary hypertension, unspecified   Hypertension, likely essential. Doubt hypertensive urgency. No evidence for an organ disease.  Nursing Notes Reviewed/ Care Coordinated Applicable Imaging Reviewed Interpretation of Laboratory Data incorporated into ED treatment  The patient appears reasonably screened and/or stabilized for discharge and I doubt any other medical condition or other [facility] requiring further screening, evaluation, or treatment in the ED at this time prior to discharge.  Plan: Home Medications- Lisinopril; Home Treatments- rest, DASH diet; return here if the recommended treatment, does not improve the symptoms; Recommended follow up- PCP of choice asap     Mancel Bale, MD 01/15/16 2101

## 2016-01-15 NOTE — ED Notes (Signed)
Pt states started having headache last night, nose bleed, dizziness, palpitations, shoulder pain.

## 2016-01-15 NOTE — Discharge Instructions (Signed)
It is important to follow the DASH diet, and get some daily exercise, to improve your blood pressure.    Hypertension Hypertension, commonly called high blood pressure, is when the force of blood pumping through your arteries is too strong. Your arteries are the blood vessels that carry blood from your heart throughout your body. A blood pressure reading consists of a higher number over a lower number, such as 110/72. The higher number (systolic) is the pressure inside your arteries when your heart pumps. The lower number (diastolic) is the pressure inside your arteries when your heart relaxes. Ideally you want your blood pressure below 120/80. Hypertension forces your heart to work harder to pump blood. Your arteries may become narrow or stiff. Having untreated or uncontrolled hypertension can cause heart attack, stroke, kidney disease, and other problems. RISK FACTORS Some risk factors for high blood pressure are controllable. Others are not.  Risk factors you cannot control include:   Race. You may be at higher risk if you are African American.  Age. Risk increases with age.  Gender. Men are at higher risk than women before age 25 years. After age 25, women are at higher risk than men. Risk factors you can control include:  Not getting enough exercise or physical activity.  Being overweight.  Getting too much fat, sugar, calories, or salt in your diet.  Drinking too much alcohol. SIGNS AND SYMPTOMS Hypertension does not usually cause signs or symptoms. Extremely high blood pressure (hypertensive crisis) may cause headache, anxiety, shortness of breath, and nosebleed. DIAGNOSIS To check if you have hypertension, your health care provider will measure your blood pressure while you are seated, with your arm held at the level of your heart. It should be measured at least twice using the same arm. Certain conditions can cause a difference in blood pressure between your right and left arms.  A blood pressure reading that is higher than normal on one occasion does not mean that you need treatment. If it is not clear whether you have high blood pressure, you may be asked to return on a different day to have your blood pressure checked again. Or, you may be asked to monitor your blood pressure at home for 1 or more weeks. TREATMENT Treating high blood pressure includes making lifestyle changes and possibly taking medicine. Living a healthy lifestyle can help lower high blood pressure. You may need to change some of your habits. Lifestyle changes may include:  Following the DASH diet. This diet is high in fruits, vegetables, and whole grains. It is low in salt, red meat, and added sugars.  Keep your sodium intake below 2,300 mg per day.  Getting at least 30-45 minutes of aerobic exercise at least 4 times per week.  Losing weight if necessary.  Not smoking.  Limiting alcoholic beverages.  Learning ways to reduce stress. Your health care provider may prescribe medicine if lifestyle changes are not enough to get your blood pressure under control, and if one of the following is true:  You are 8018-25 years of age and your systolic blood pressure is above 140.  You are 25 years of age or older, and your systolic blood pressure is above 150.  Your diastolic blood pressure is above 90.  You have diabetes, and your systolic blood pressure is over 140 or your diastolic blood pressure is over 90.  You have kidney disease and your blood pressure is above 140/90.  You have heart disease and your blood pressure is above  140/90. Your personal target blood pressure may vary depending on your medical conditions, your age, and other factors. HOME CARE INSTRUCTIONS  Have your blood pressure rechecked as directed by your health care provider.   Take medicines only as directed by your health care provider. Follow the directions carefully. Blood pressure medicines must be taken as prescribed.  The medicine does not work as well when you skip doses. Skipping doses also puts you at risk for problems.  Do not smoke.   Monitor your blood pressure at home as directed by your health care provider. SEEK MEDICAL CARE IF:   You think you are having a reaction to medicines taken.  You have recurrent headaches or feel dizzy.  You have swelling in your ankles.  You have trouble with your vision. SEEK IMMEDIATE MEDICAL CARE IF:  You develop a severe headache or confusion.  You have unusual weakness, numbness, or feel faint.  You have severe chest or abdominal pain.  You vomit repeatedly.  You have trouble breathing. MAKE SURE YOU:   Understand these instructions.  Will watch your condition.  Will get help right away if you are not doing well or get worse.   This information is not intended to replace advice given to you by your health care provider. Make sure you discuss any questions you have with your health care provider.   Document Released: 09/03/2005 Document Revised: 01/18/2015 Document Reviewed: 06/26/2013 Elsevier Interactive Patient Education 2016 Elsevier Inc.  DASH Eating Plan DASH stands for "Dietary Approaches to Stop Hypertension." The DASH eating plan is a healthy eating plan that has been shown to reduce high blood pressure (hypertension). Additional health benefits may include reducing the risk of type 2 diabetes mellitus, heart disease, and stroke. The DASH eating plan may also help with weight loss. WHAT DO I NEED TO KNOW ABOUT THE DASH EATING PLAN? For the DASH eating plan, you will follow these general guidelines:  Choose foods with a percent daily value for sodium of less than 5% (as listed on the food label).  Use salt-free seasonings or herbs instead of table salt or sea salt.  Check with your health care provider or pharmacist before using salt substitutes.  Eat lower-sodium products, often labeled as "lower sodium" or "no salt  added."  Eat fresh foods.  Eat more vegetables, fruits, and low-fat dairy products.  Choose whole grains. Look for the word "whole" as the first word in the ingredient list.  Choose fish and skinless chicken or [place] more often than red meat. Limit fish, poultry, and meat to 6 oz (170 g) each day.  Limit sweets, desserts, sugars, and sugary drinks.  Choose heart-healthy fats.  Limit cheese to 1 oz (28 g) per day.  Eat more home-cooked food and less restaurant, buffet, and fast food.  Limit fried foods.  Cook foods using methods other than frying.  Limit canned vegetables. If you do use them, rinse them well to decrease the sodium.  When eating at a restaurant, ask that your food be prepared with less salt, or no salt if possible. WHAT FOODS CAN I EAT? Seek help from a dietitian for individual calorie needs. Grains Whole grain or whole wheat bread. Brown rice. Whole grain or whole wheat pasta. Quinoa, bulgur, and whole grain cereals. Low-sodium cereals. Corn or whole wheat flour tortillas. Whole grain cornbread. Whole grain crackers. Low-sodium crackers. Vegetables Fresh or frozen vegetables (raw, steamed, roasted, or grilled). Low-sodium or reduced-sodium tomato and vegetable juices. Low-sodium or reduced-sodium  tomato sauce and paste. Low-sodium or reduced-sodium canned vegetables.  Fruits All fresh, canned (in natural juice), or frozen fruits. Meat and Other Protein Products Ground beef (85% or leaner), grass-fed beef, or beef trimmed of fat. Skinless chicken or [place]. Ground chicken or [place]. Pork trimmed of fat. All fish and seafood. Eggs. Dried beans, peas, or lentils. Unsalted nuts and seeds. Unsalted canned beans. Dairy Low-fat dairy products, such as skim or 1% milk, 2% or reduced-fat cheeses, low-fat ricotta or cottage cheese, or plain low-fat yogurt. Low-sodium or reduced-sodium cheeses. Fats and Oils Tub margarines without trans fats. Light or reduced-fat  mayonnaise and salad dressings (reduced sodium). Avocado. Safflower, olive, or canola oils. Natural peanut or almond butter. Other Unsalted popcorn and pretzels. The items listed above may not be a complete list of recommended foods or beverages. Contact your dietitian for more options. WHAT FOODS ARE NOT RECOMMENDED? Grains White bread. White pasta. White rice. Refined cornbread. Bagels and croissants. Crackers that contain trans fat. Vegetables Creamed or fried vegetables. Vegetables in a cheese sauce. Regular canned vegetables. Regular canned tomato sauce and paste. Regular tomato and vegetable juices. Fruits Dried fruits. Canned fruit in light or heavy syrup. Fruit juice. Meat and Other Protein Products Fatty cuts of meat. Ribs, chicken wings, bacon, sausage, bologna, salami, chitterlings, fatback, hot dogs, bratwurst, and packaged luncheon meats. Salted nuts and seeds. Canned beans with salt. Dairy Whole or 2% milk, cream, half-and-half, and cream cheese. Whole-fat or sweetened yogurt. Full-fat cheeses or blue cheese. Nondairy creamers and whipped toppings. Processed cheese, cheese spreads, or cheese curds. Condiments Onion and garlic salt, seasoned salt, table salt, and sea salt. Canned and packaged gravies. Worcestershire sauce. Tartar sauce. Barbecue sauce. Teriyaki sauce. Soy sauce, including reduced sodium. Steak sauce. Fish sauce. Oyster sauce. Cocktail sauce. Horseradish. Ketchup and mustard. Meat flavorings and tenderizers. Bouillon cubes. Hot sauce. Tabasco sauce. Marinades. Taco seasonings. Relishes. Fats and Oils Butter, stick margarine, lard, shortening, ghee, and bacon fat. Coconut, palm kernel, or palm oils. Regular salad dressings. Other Pickles and olives. Salted popcorn and pretzels. The items listed above may not be a complete list of foods and beverages to avoid. Contact your dietitian for more information. WHERE CAN I FIND MORE INFORMATION? National Heart, Lung, and  Blood Institute: [employer]   This information is not intended to replace advice given to you by your health care provider. Make sure you discuss any questions you have with your health care provider.   Document Released: 08/23/2011 Document Revised: 09/24/2014 Document Reviewed: 07/08/2013 Elsevier Interactive Patient Education [employer]

## 2016-11-14 ENCOUNTER — Encounter (HOSPITAL_COMMUNITY): Payer: Self-pay

## 2016-11-14 ENCOUNTER — Inpatient Hospital Stay (HOSPITAL_COMMUNITY)
Admission: AD | Admit: 2016-11-14 | Discharge: 2016-11-14 | Disposition: A | Discharge status: Home / Self Care | Payer: Medicaid Other | Source: Ambulatory Visit | Attending: Family Medicine | Admitting: Family Medicine

## 2016-11-14 DIAGNOSIS — Z3201 Encounter for pregnancy test, result positive: Secondary | ICD-10-CM

## 2016-11-14 DIAGNOSIS — O99331 Smoking (tobacco) complicating pregnancy, first trimester: Secondary | ICD-10-CM | POA: Insufficient documentation

## 2016-11-14 DIAGNOSIS — O10911 Unspecified pre-existing hypertension complicating pregnancy, first trimester: Secondary | ICD-10-CM

## 2016-11-14 DIAGNOSIS — Z3A01 Less than 8 weeks gestation of pregnancy: Secondary | ICD-10-CM | POA: Insufficient documentation

## 2016-11-14 DIAGNOSIS — O10011 Pre-existing essential hypertension complicating pregnancy, first trimester: Secondary | ICD-10-CM | POA: Insufficient documentation

## 2016-11-14 HISTORY — DX: Essential (primary) hypertension: I10

## 2016-11-14 LAB — URINALYSIS, ROUTINE W REFLEX MICROSCOPIC
BILIRUBIN URINE: NEGATIVE
Glucose, UA: NEGATIVE mg/dL
KETONES UR: NEGATIVE mg/dL
NITRITE: NEGATIVE
PROTEIN: 30 mg/dL — AB
Specific Gravity, Urine: 1.03 (ref 1.005–1.030)
pH: 6 (ref 5.0–8.0)

## 2016-11-14 LAB — POCT PREGNANCY, URINE: PREG TEST UR: POSITIVE — AB

## 2016-11-14 MED ORDER — LABETALOL HCL 100 MG PO TABS
100.0000 mg | ORAL_TABLET | Freq: Once | ORAL | Status: AC
  Administered 2016-11-14: 100 mg via ORAL
  Filled 2016-11-14: qty 1

## 2016-11-14 MED ORDER — LABETALOL HCL 100 MG PO TABS: 100.0000 mg | ORAL_TABLET | Freq: Two times a day (BID) | ORAL | 0 refills | Status: DC

## 2016-11-14 NOTE — MAU Provider Note (Signed)
History     CSN: [id number]  Arrival date and time: 11/14/16 1458   First Provider Initiated Contact with Patient 11/14/16 1536      Chief Complaint  Patient presents with  . Hypertension   HPI Kayla Richard is a 26 y.o. G4P0020 at [redacted]w[redacted]d by LMP who presents for BP evaluation. Went to [facility] today for pregnancy test which was positive. Sent here d/t elevated BP. Patient states she was diagnosed with hypertension last year in the ED but never picked up the antihypertensives they prescribed her & didn't follow up with a PCP. Also has hx of cervical insufficiency with 2 second trimester losses.  Denies headache, chest pain, shortness of breath, abdominal pain, or vaginal bleeding.   OB History    Gravida Para Term Preterm AB Living   4 1     2  0   SAB TAB Ectopic Multiple Live Births   1 1   0 0      Past Medical History:  Diagnosis Date  . Cyst of breast     Past Surgical History:  Procedure Laterality Date  . CERVICAL CERCLAGE N/A 04/15/2015   Procedure: CERCLAGE CERVICAL;  Surgeon: Jaymes Graff, MD;  Location: [facility];  Service: Gynecology;  Laterality: N/A;    History reviewed. No pertinent family history.  Social History  Substance Use Topics  . Smoking status: Current Every Day Smoker  . Smokeless tobacco: Not on file  . Alcohol use No    Allergies: No Known Allergies  Prescriptions Prior to Admission  Medication Sig Dispense Refill Last Dose  . acetaminophen (TYLENOL) 325 MG tablet Take 650 mg by mouth every 6 (six) hours as needed for mild pain.    11/28/2015 at Unknown time  . lisinopril (PRINIVIL,ZESTRIL) 20 MG tablet Take 1 tablet (20 mg total) by mouth daily. 30 tablet 3     Review of Systems  Constitutional: Negative.   Respiratory: Negative.   Cardiovascular: Negative.   Gastrointestinal: Negative.   Genitourinary: Negative.    Physical Exam   Blood pressure 158/91, pulse 74, temperature 97.9 F (36.6 C), temperature source Oral, resp. rate 18,  last menstrual period 10/03/2016, unknown if currently breastfeeding.  Temp:  [97.9 F (36.6 C)] 97.9 F (36.6 C) (02/28 1523) Pulse Rate:  [59-74] 74 (02/28 1545) Resp:  [18] 18 (02/28 1523) BP: (158-163)/(91-95) 158/91 (02/28 1545)   Physical Exam  Nursing note and vitals reviewed. Constitutional: She is oriented to person, place, and time. She appears well-developed and well-nourished. No distress.  HENT:  Head: Normocephalic and atraumatic.  Eyes: Conjunctivae are normal. Right eye exhibits no discharge. Left eye exhibits no discharge. No scleral icterus.  Neck: Normal range of motion.  Cardiovascular: Normal rate, regular rhythm and normal heart sounds.   No murmur heard. Respiratory: Effort normal and breath sounds normal. No respiratory distress. She has no wheezes.  GI: Soft. She exhibits no distension. There is no tenderness. There is no rebound and no guarding.  Neurological: She is alert and oriented to person, place, and time.  Skin: Skin is warm and dry. She is not diaphoretic.  Psychiatric: She has a normal mood and affect. Her behavior is normal. Judgment and thought content normal.    MAU Course  Procedures Results for orders placed or performed during the hospital encounter of 11/14/16 (from the past 24 hour(s))  Urinalysis, Routine w reflex microscopic (not at [facility])     Status: Abnormal   Collection Time: 11/14/16  3:00 PM  Result Value Ref Range   Color, Urine YELLOW YELLOW   APPearance HAZY (A) CLEAR   Specific Gravity, Urine 1.030 1.005 - 1.030   pH 6.0 5.0 - 8.0   Glucose, UA NEGATIVE NEGATIVE mg/dL   Hgb urine dipstick SMALL (A) NEGATIVE   Bilirubin Urine NEGATIVE NEGATIVE   Ketones, ur NEGATIVE NEGATIVE mg/dL   Protein, ur 30 (A) NEGATIVE mg/dL   Nitrite NEGATIVE NEGATIVE   Leukocytes, UA TRACE (A) NEGATIVE   RBC / HPF 0-5 0 - 5 RBC/hpf   WBC, UA 0-5 0 - 5 WBC/hpf   Bacteria, UA FEW (A) NONE SEEN   Squamous Epithelial / LPF 6-30 (A) NONE SEEN    Mucous PRESENT   Pregnancy, urine POC     Status: Abnormal   Collection Time: 11/14/16  3:15 PM  Result Value Ref Range   Preg Test, Ur POSITIVE (A) NEGATIVE    MDM UPT positive Labetalol 100 mg PO given in MAU prior to discharge Will schedule pt for BP check in office next week & [facility]   Assessment and Plan  A:  1. 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 Positive pregnancy test    P: Discharge home Rx labetalol 100mg  BID-- stressed importance of taking medication as prescribed [facility] & [facility] care Discussed reasons to return to MAU Discussed reasons to go to ED including CP, SOB, or signs of stroke  Judeth Hornrin Shylo Zamor 11/14/2016, 3:33 PM

## 2016-11-14 NOTE — Discharge Instructions (Signed)
Hypertension During Pregnancy °Hypertension, commonly called high blood pressure, is when the force of blood pumping through your arteries is too strong. Arteries are blood vessels that carry blood from the heart throughout the body. Hypertension during pregnancy can cause problems for you and your baby. Your baby may be born early (prematurely) or may not weigh as much as he or she should at birth. Very bad cases of hypertension during pregnancy can be life-threatening. °Different types of hypertension can occur during pregnancy. These include: °· Chronic hypertension. This happens when: °¨ You have hypertension before pregnancy and it continues during pregnancy. °¨ You develop hypertension before you are [redacted] weeks pregnant, and it continues during pregnancy. °· Gestational hypertension. This is hypertension that develops after the 20th week of pregnancy. °· Preeclampsia, also called toxemia of pregnancy. This is a very serious type of hypertension that develops only during pregnancy. It affects the whole body, and it can be very dangerous for you and your baby. °Gestational hypertension and preeclampsia usually go away within 6 weeks after your baby is born. Women who have hypertension during pregnancy have a greater chance of developing hypertension later in life or during future pregnancies. °What are the causes? °The exact cause of hypertension is not known. °What increases the risk? °There are certain factors that make it more likely for you to develop hypertension during pregnancy. These include: °· Having hypertension during a previous pregnancy or prior to pregnancy. °· Being overweight. °· Being older than age 40. °· Being pregnant for the first time or being pregnant with more than one baby. °· Becoming pregnant using fertilization methods such as IVF (in vitro fertilization). °· Having diabetes, kidney problems, or systemic lupus erythematosus. °· Having a family history of hypertension. °What are the  signs or symptoms? °Chronic hypertension and gestational hypertension rarely cause symptoms. Preeclampsia causes symptoms, which may include: °· Increased protein in your urine. Your health care provider will check for this at every visit before you give birth (prenatal visit). °· Severe headaches. °· Sudden weight gain. °· Swelling of the hands, face, legs, and feet. °· Nausea and vomiting. °· Vision problems, such as blurred or double vision. °· Numbness in the face, arms, legs, and feet. °· Dizziness. °· Slurred speech. °· Sensitivity to bright lights. °· Abdominal pain. °· Convulsions. °How is this diagnosed? °You may be diagnosed with hypertension during a routine prenatal exam. At each prenatal visit, you may: °· Have a urine test to check for high amounts of protein in your urine. °· Have your blood pressure checked. A blood pressure reading is recorded as two numbers, such as "120 over 80" (or 120/80). The first ("top") number is called the systolic pressure. It is a measure of the pressure in your arteries when your heart beats. The second ("bottom") number is called the diastolic pressure. It is a measure of the pressure in your arteries as your heart relaxes between beats. Blood pressure is measured in a unit called mm Hg. A normal blood pressure reading is: °¨ Systolic: below 120. °¨ Diastolic: below 80. °The type of hypertension that you are diagnosed with depends on your test results and when your symptoms developed. °· Chronic hypertension is usually diagnosed before 20 weeks of pregnancy. °· Gestational hypertension is usually diagnosed after 20 weeks of pregnancy. °· Hypertension with high amounts of protein in the urine is diagnosed as preeclampsia. °· Blood pressure measurements that stay above 160 systolic, or above 110 diastolic, are signs of severe preeclampsia. °  How is this treated? Treatment for hypertension during pregnancy varies depending on the type of hypertension you have and how  serious it is.  If you take medicines called ACE inhibitors to treat chronic hypertension, you may need to switch medicines. ACE inhibitors should not be taken during pregnancy.  If you have gestational hypertension, you may need to take blood pressure medicine.  If you are at risk for preeclampsia, your health care provider may recommend that you take a low-dose aspirin every day to prevent high blood pressure during your pregnancy.  If you have severe preeclampsia, you may need to be hospitalized so you and your baby can be monitored closely. You may also need to take medicine (magnesium sulfate) to prevent seizures and to lower blood pressure. This medicine may be given as an injection or through an IV tube.  In some cases, if your condition gets worse, you may need to deliver your baby early. Follow these instructions at home: Eating and drinking   Drink enough fluid to keep your urine clear or pale yellow.  Eat a healthy diet that is low in salt (sodium). Do not add salt to your food. Check food labels to see how much sodium a food or beverage contains. Lifestyle   Do not use any products that contain nicotine or tobacco, such as cigarettes and e-cigarettes. If you need help quitting, ask your health care provider.  Do not use alcohol.  Avoid caffeine.  Avoid stress as much as possible. Rest and get plenty of sleep. General instructions   Take over-the-counter and prescription medicines only as told by your health care provider.  While lying down, lie on your left side. This keeps pressure off your baby.  While sitting or lying down, raise (elevate) your feet. Try putting some pillows under your lower legs.  Exercise regularly. Ask your health care provider what kinds of exercise are best for you.  Keep all prenatal and follow-up visits as told by your health care provider. This is important. Contact a health care provider if:  You have symptoms that your health care  provider told you may require more treatment or monitoring, such as:  Fever.  Vomiting.  Headache. Get help right away if:         You have headache unrelieved by tylenol, stroke symptoms, chest pain, or shortness of breath  You have severe abdominal pain or vomiting that does not get better with treatment.  You suddenly develop swelling in your hands, ankles, or face.  You gain 4 lbs (1.8 kg) or more in 1 week.  You develop vaginal bleeding, or you have blood in your urine.  You do not feel your baby moving as much as usual.  You have blurred or double vision.  You have muscle twitching or sudden tightening (spasms).  You have shortness of breath.  Your lips or fingernails turn blue. This information is not intended to replace advice given to you by your health care provider. Make sure you discuss any questions you have with your health care provider. Document Released: 05/22/2011 Document Revised: 03/23/2016 Document Reviewed: 02/17/2016 [employer] Interactive Patient Education  2017 [employer]

## 2016-11-14 NOTE — MAU Note (Signed)
[facility] elevated [facility]. Patient states that she knew she had hypertension but never followed up with PCP. Patient just found out she was pregnant today at [facility].

## 2016-11-21 ENCOUNTER — Ambulatory Visit: Payer: Self-pay | Admitting: General Practice

## 2016-11-21 VITALS — BP 153/88

## 2016-11-21 DIAGNOSIS — Z0130 Encounter for examination of blood pressure without abnormal findings: Secondary | ICD-10-CM

## 2016-11-21 NOTE — Progress Notes (Signed)
Patient here for blood pressure check today following recent initiation of blood pressure medication. Patient denies headaches, dizziness, or blurry vision. Patient states she last took the medication last night around 10pm and she sometimes forgets to take the medication twice a day. Reviewed blood pressure with Dr Alysia PennaErvin, who states we will not adjust medication at this time but patient needs to be more viligient about taking her medication. Discussed with patient that she needs to ensure she is taking her medication twice a day and recommended she set an alarm and keep the medication with her at all times. Told patient we do not know if this medication helps her or if it is the best option unless she takes as directed. Patient verbalized understanding and is aware of new OB appt on 3/14.

## 2016-11-28 ENCOUNTER — Encounter: Payer: Self-pay | Admitting: Family Medicine

## 2016-11-28 ENCOUNTER — Ambulatory Visit (INDEPENDENT_AMBULATORY_CARE_PROVIDER_SITE_OTHER): Payer: Medicaid Other | Admitting: Family Medicine

## 2016-11-28 ENCOUNTER — Encounter (HOSPITAL_COMMUNITY): Payer: Self-pay

## 2016-11-28 VITALS — BP 157/101 | HR 91 | Wt 228.8 lb

## 2016-11-28 DIAGNOSIS — Z113 Encounter for screening for infections with a predominantly sexual mode of transmission: Secondary | ICD-10-CM | POA: Diagnosis not present

## 2016-11-28 DIAGNOSIS — Z124 Encounter for screening for malignant neoplasm of cervix: Secondary | ICD-10-CM | POA: Diagnosis not present

## 2016-11-28 DIAGNOSIS — N883 Incompetence of cervix uteri: Secondary | ICD-10-CM | POA: Diagnosis not present

## 2016-11-28 DIAGNOSIS — O169 Unspecified maternal hypertension, unspecified trimester: Secondary | ICD-10-CM | POA: Insufficient documentation

## 2016-11-28 DIAGNOSIS — O10011 Pre-existing essential hypertension complicating pregnancy, first trimester: Secondary | ICD-10-CM

## 2016-11-28 DIAGNOSIS — O09211 Supervision of pregnancy with history of pre-term labor, first trimester: Secondary | ICD-10-CM | POA: Diagnosis not present

## 2016-11-28 DIAGNOSIS — I10 Essential (primary) hypertension: Secondary | ICD-10-CM

## 2016-11-28 DIAGNOSIS — O0990 Supervision of high risk pregnancy, unspecified, unspecified trimester: Secondary | ICD-10-CM | POA: Insufficient documentation

## 2016-11-28 DIAGNOSIS — O0991 Supervision of high risk pregnancy, unspecified, first trimester: Secondary | ICD-10-CM

## 2016-11-28 DIAGNOSIS — O10019 Pre-existing essential hypertension complicating pregnancy, unspecified trimester: Secondary | ICD-10-CM

## 2016-11-28 LAB — POCT URINALYSIS DIP (DEVICE)
Glucose, UA: NEGATIVE mg/dL
Nitrite: NEGATIVE
Protein, ur: 30 mg/dL — AB
SPECIFIC GRAVITY, URINE: 1.02 (ref 1.005–1.030)
UROBILINOGEN UA: 0.2 mg/dL (ref 0.0–1.0)
pH: 6 (ref 5.0–8.0)

## 2016-11-28 MED ORDER — LABETALOL HCL 200 MG PO TABS: 100.0000 mg | ORAL_TABLET | Freq: Two times a day (BID) | ORAL | 3 refills | Status: DC

## 2016-11-28 NOTE — Progress Notes (Signed)
Subjective:    Kayla Richard is a G4P0020 [redacted]w[redacted]d being seen today for her first obstetrical visit.  Her obstetrical history is significant for obesity and incompetent cervix and chronic hypertension. Pregnancy history fully reviewed.  Patient reports backache.  Vitals:   11/28/16 1314 11/28/16 1327  BP: (!) 150/99 (!) 157/101  Pulse: 91   Weight: 228 lb 12.8 oz (103.8 kg)     HISTORY: OB History  Gravida Para Term Preterm AB Living  4 1 0 0 2 0  SAB TAB Ectopic Multiple Live Births  2 0 0 0 0    # Outcome Date GA Lbr Len/2nd Weight Sex Delivery Anes PTL Lv  4 Current           3 Para 05/02/15 [redacted]w[redacted]d  8.6 oz (0.244 kg) M Vag-Spont None    2 SAB 2015          1 SAB  [redacted]w[redacted]d       ND     Past Medical History:  Diagnosis Date  . Chronic hypertension   . Cyst of breast   . Preterm labor    Past Surgical History:  Procedure Laterality Date  . CERVICAL CERCLAGE N/A 04/15/2015   Procedure: CERCLAGE CERVICAL;  Surgeon: Jaymes Graff, MD;  Location: [facility];  Service: Gynecology;  Laterality: N/A;   Family History  Problem Relation Age of Onset  . Hypertension Mother   . Diabetes Father      Exam    Uterus:   limited by body habitus  Pelvic Exam:    Perineum: Normal Perineum   Vulva: Bartholin's, Urethra, Skene's normal   Vagina:  normal mucosa, normal discharge   Cervix: no bleeding following Pap, no cervical motion tenderness and no lesions cervix is closed and has good tone   Adnexa: normal adnexa   Bony Pelvis: average  System: Breast:  normal appearance, no masses or tenderness   Skin: normal coloration and turgor, no rashes    Neurologic: gait normal; non-focal   Extremities: normal strength, tone, and muscle mass   HEENT extra ocular movement intact and sclera clear, anicteric   Mouth/Teeth mucous membranes moist, pharynx normal without lesions   Neck supple   Cardiovascular: regular rate and rhythm, no murmurs or gallops   Respiratory:  appears well,  vitals normal, no respiratory distress, acyanotic, normal RR, ear and throat exam is normal, neck free of mass or lymphadenopathy, chest clear, no wheezing, crepitations, rhonchi, normal symmetric air entry   Abdomen: soft, non-tender; bowel sounds normal; no masses,  no organomegaly      Assessment/Plan:    Pregnancy: G4P0020  1. Current pregnancy with history of pre-term labor in first trimester 2nd trimester loss x 2 last with rescue cerclage.  2. Supervision of high risk pregnancy in first trimester New OB labs and order nuchal translucency - Cytology - PAP - Culture, OB Urine - Hemoglobinopathy Evaluation - [place] MFM Fetal Nuchal Translucency; Future - Hemoglobin A1c - Obstetric Panel, Including HIV  3. Pre-existing essential hypertension during pregnancy, antepartum Baseline labs Serial u/s for growth and 2x/wk testing when appropriate Add ASA at next visit at 12 wks. BP is not well controlled, will increase labetalol to 200 mg bid. - TSH - Comprehensive metabolic panel - Protein / creatinine ratio, urine - labetalol (NORMODYNE) 200 MG tablet; Take 0.5 tablets (100 mg total) by mouth 2 (two) times daily.  Dispense: 60 tablet; Refill: 3 - Obstetric Panel, Including HIV  4. Incompetent cervix Schedule  Prophylactic cerclage and serial cervical lengths.   Reva Boresanya S Myeesha Shane 11/28/2016

## 2016-11-28 NOTE — Progress Notes (Signed)
Patient reports headaches, dizziness & blurry vision.

## 2016-11-28 NOTE — Patient Instructions (Signed)
 First Trimester of Pregnancy The first trimester of pregnancy is from week 1 until the end of week 13 (months 1 through 3). A week after a sperm fertilizes an egg, the egg will implant on the wall of the uterus. This embryo will begin to develop into a baby. Genes from you and your partner will form the baby. The female genes will determine whether the baby will be a boy or a girl. At 6-8 weeks, the eyes and face will be formed, and the heartbeat can be seen on ultrasound. At the end of 12 weeks, all the baby's organs will be formed. Now that you are pregnant, you will want to do everything you can to have a healthy baby. Two of the most important things are to get good prenatal care and to follow your health care provider's instructions. Prenatal care is all the medical care you receive before the baby's birth. This care will help prevent, find, and treat any problems during the pregnancy and childbirth. Body changes during your first trimester Your body goes through many changes during pregnancy. The changes vary from woman to woman.  You may gain or lose a couple of pounds at first.  You may feel sick to your stomach (nauseous) and you may throw up (vomit). If the vomiting is uncontrollable, call your health care provider.  You may tire easily.  You may develop headaches that can be relieved by medicines. All medicines should be approved by your health care provider.  You may urinate more often. Painful urination may mean you have a bladder infection.  You may develop heartburn as a result of your pregnancy.  You may develop constipation because certain hormones are causing the muscles that push stool through your intestines to slow down.  You may develop hemorrhoids or swollen veins (varicose veins).  Your breasts may begin to grow larger and become tender. Your nipples may stick out more, and the tissue that surrounds them (areola) may become darker.  Your gums may bleed and may be  sensitive to brushing and flossing.  Dark spots or blotches (chloasma, mask of pregnancy) may develop on your face. This will likely fade after the baby is born.  Your menstrual periods will stop.  You may have a loss of appetite.  You may develop cravings for certain kinds of food.  You may have changes in your emotions from day to day, such as being excited to be pregnant or being concerned that something may go wrong with the pregnancy and baby.  You may have more vivid and strange dreams.  You may have changes in your hair. These can include thickening of your hair, rapid growth, and changes in texture. Some women also have hair loss during or after pregnancy, or hair that feels dry or thin. Your hair will most likely return to normal after your baby is born.  What to expect at prenatal visits During a routine prenatal visit:  You will be weighed to make sure you and the baby are growing normally.  Your blood pressure will be taken.  Your abdomen will be measured to track your baby's growth.  The fetal heartbeat will be listened to between weeks 10 and 14 of your pregnancy.  Test results from any previous visits will be discussed.  Your health care provider may ask you:  How you are feeling.  If you are feeling the baby move.  If you have had any abnormal symptoms, such as leaking fluid, bleeding, severe   headaches, or abdominal cramping.  If you are using any tobacco products, including cigarettes, chewing tobacco, and electronic cigarettes.  If you have any questions.  Other tests that may be performed during your first trimester include:  Blood tests to find your blood type and to check for the presence of any previous infections. The tests will also be used to check for low iron levels (anemia) and protein on red blood cells (Rh antibodies). Depending on your risk factors, or if you previously had diabetes during pregnancy, you may have tests to check for high blood  sugar that affects pregnant women (gestational diabetes).  Urine tests to check for infections, diabetes, or protein in the urine.  An ultrasound to confirm the proper growth and development of the baby.  Fetal screens for spinal cord problems (spina bifida) and Down syndrome.  HIV (human immunodeficiency virus) testing. Routine prenatal testing includes screening for HIV, unless you choose not to have this test.  You may need other tests to make sure you and the baby are doing well.  Follow these instructions at home: Medicines  Follow your health care provider's instructions regarding medicine use. Specific medicines may be either safe or unsafe to take during pregnancy.  Take a prenatal vitamin that contains at least 600 micrograms (mcg) of folic acid.  If you develop constipation, try taking a stool softener if your health care provider approves. Eating and drinking  Eat a balanced diet that includes fresh fruits and vegetables, whole grains, good sources of protein such as meat, eggs, or tofu, and low-fat dairy. Your health care provider will help you determine the amount of weight gain that is right for you.  Avoid raw meat and uncooked cheese. These carry germs that can cause birth defects in the baby.  Eating four or five small meals rather than three large meals a day may help relieve nausea and vomiting. If you start to feel nauseous, eating a few soda crackers can be helpful. Drinking liquids between meals, instead of during meals, also seems to help ease nausea and vomiting.  Limit foods that are high in fat and processed sugars, such as fried and sweet foods.  To prevent constipation: ? Eat foods that are high in fiber, such as fresh fruits and vegetables, whole grains, and beans. ? Drink enough fluid to keep your urine clear or pale yellow. Activity  Exercise only as directed by your health care provider. Most women can continue their usual exercise routine during  pregnancy. Try to exercise for 30 minutes at least 5 days a week. Exercising will help you: ? Control your weight. ? Stay in shape. ? Be prepared for labor and delivery.  Experiencing pain or cramping in the lower abdomen or lower back is a good sign that you should stop exercising. Check with your health care provider before continuing with normal exercises.  Try to avoid standing for long periods of time. Move your legs often if you must stand in one place for a long time.  Avoid heavy lifting.  Wear low-heeled shoes and practice good posture.  You may continue to have sex unless your health care provider tells you not to. Relieving pain and discomfort  Wear a good support bra to relieve breast tenderness.  Take warm sitz baths to soothe any pain or discomfort caused by hemorrhoids. Use hemorrhoid cream if your health care provider approves.  Rest with your legs elevated if you have leg cramps or low back pain.  If you   develop varicose veins in your legs, wear support hose. Elevate your feet for 15 minutes, 3-4 times a day. Limit salt in your diet. Prenatal care  Schedule your prenatal visits by the twelfth week of pregnancy. They are usually scheduled monthly at first, then more often in the last 2 months before delivery.  Write down your questions. Take them to your prenatal visits.  Keep all your prenatal visits as told by your health care provider. This is important. Safety  Wear your seat belt at all times when driving.  Make a list of emergency phone numbers, including numbers for family, friends, the hospital, and police and fire departments. General instructions  Ask your health care provider for a referral to a local prenatal education class. Begin classes no later than the beginning of month 6 of your pregnancy.  Ask for help if you have counseling or nutritional needs during pregnancy. Your health care provider can offer advice or refer you to specialists for help  with various needs.  Do not use hot tubs, steam rooms, or saunas.  Do not douche or use tampons or scented sanitary pads.  Do not cross your legs for long periods of time.  Avoid cat litter boxes and soil used by cats. These carry germs that can cause birth defects in the baby and possibly loss of the fetus by miscarriage or stillbirth.  Avoid all smoking, herbs, alcohol, and medicines not prescribed by your health care provider. Chemicals in these products affect the formation and growth of the baby.  Do not use any products that contain nicotine or tobacco, such as cigarettes and e-cigarettes. If you need help quitting, ask your health care provider. You may receive counseling support and other resources to help you quit.  Schedule a dentist appointment. At home, brush your teeth with a soft toothbrush and be gentle when you floss. Contact a health care provider if:  You have dizziness.  You have mild pelvic cramps, pelvic pressure, or nagging pain in the abdominal area.  You have persistent nausea, vomiting, or diarrhea.  You have a bad smelling vaginal discharge.  You have pain when you urinate.  You notice increased swelling in your face, hands, legs, or ankles.  You are exposed to fifth disease or chickenpox.  You are exposed to German measles (rubella) and have never had it. Get help right away if:  You have a fever.  You are leaking fluid from your vagina.  You have spotting or bleeding from your vagina.  You have severe abdominal cramping or pain.  You have rapid weight gain or loss.  You vomit blood or material that looks like coffee grounds.  You develop a severe headache.  You have shortness of breath.  You have any kind of trauma, such as from a fall or a car accident. Summary  The first trimester of pregnancy is from week 1 until the end of week 13 (months 1 through 3).  Your body goes through many changes during pregnancy. The changes vary from  woman to woman.  You will have routine prenatal visits. During those visits, your health care provider will examine you, discuss any test results you may have, and talk with you about how you are feeling. This information is not intended to replace advice given to you by your health care provider. Make sure you discuss any questions you have with your health care provider. Document Released: 08/28/2001 Document Revised: 08/15/2016 Document Reviewed: 08/15/2016 Elsevier Interactive Patient Education  2017   [employer]   Breastfeeding Deciding to breastfeed is one of the best choices you can make for you and your baby. A change in hormones during pregnancy causes your breast tissue to grow and increases the number and size of your milk ducts. These hormones also allow proteins, sugars, and fats from your blood supply to make breast milk in your milk-producing glands. Hormones prevent breast milk from being released before your baby is born as well as prompt milk flow after birth. Once breastfeeding has begun, thoughts of your baby, as well as his or her sucking or crying, can stimulate the release of milk from your milk-producing glands. Benefits of breastfeeding For Your Baby  Your first milk (colostrum) helps your baby's digestive system function better.  There are antibodies in your milk that help your baby fight off infections.  Your baby has a lower incidence of asthma, allergies, and sudden infant death syndrome.  The nutrients in breast milk are better for your baby than infant formulas and are designed uniquely for your baby's needs.  Breast milk improves your baby's brain development.  Your baby is less likely to develop other conditions, such as childhood obesity, asthma, or type 2 diabetes mellitus.  For You  Breastfeeding helps to create a very special bond between you and your baby.  Breastfeeding is convenient. Breast milk is always available at the correct temperature and  costs nothing.  Breastfeeding helps to burn calories and helps you lose the weight gained during pregnancy.  Breastfeeding makes your uterus contract to its prepregnancy size faster and slows bleeding (lochia) after you give birth.  Breastfeeding helps to lower your risk of developing type 2 diabetes mellitus, osteoporosis, and breast or ovarian cancer later in life.  Signs that your baby is hungry Early Signs of Hunger  Increased alertness or activity.  Stretching.  Movement of the head from side to side.  Movement of the head and opening of the mouth when the corner of the mouth or cheek is stroked (rooting).  Increased sucking sounds, smacking lips, cooing, sighing, or squeaking.  Hand-to-mouth movements.  Increased sucking of fingers or hands.  Late Signs of Hunger  Fussing.  Intermittent crying.  Extreme Signs of Hunger Signs of extreme hunger will require calming and consoling before your baby will be able to breastfeed successfully. Do not wait for the following signs of extreme hunger to occur before you initiate breastfeeding:  Restlessness.  A loud, strong cry.  Screaming.  Breastfeeding basics Breastfeeding Initiation  Find a comfortable place to sit or lie down, with your neck and back well supported.  Place a pillow or rolled up blanket under your baby to bring him or her to the level of your breast (if you are seated). Nursing pillows are specially designed to help support your arms and your baby while you breastfeed.  Make sure that your baby's abdomen is facing your abdomen.  Gently massage your breast. With your fingertips, massage from your chest wall toward your nipple in a circular motion. This encourages milk flow. You may need to continue this action during the feeding if your milk flows slowly.  Support your breast with 4 fingers underneath and your thumb above your nipple. Make sure your fingers are well away from your nipple and your baby's  mouth.  Stroke your baby's lips gently with your finger or nipple.  When your baby's mouth is open wide enough, quickly bring your baby to your breast, placing your entire nipple and as   much of the colored area around your nipple (areola) as possible into your baby's mouth. ? More areola should be visible above your baby's upper lip than below the lower lip. ? Your baby's tongue should be between his or her lower gum and your breast.  Ensure that your baby's mouth is correctly positioned around your nipple (latched). Your baby's lips should create a seal on your breast and be turned out (everted).  It is common for your baby to suck about 2-3 minutes in order to start the flow of breast milk.  Latching Teaching your baby how to latch on to your breast properly is very important. An improper latch can cause nipple pain and decreased milk supply for you and poor weight gain in your baby. Also, if your baby is not latched onto your nipple properly, he or she may swallow some air during feeding. This can make your baby fussy. Burping your baby when you switch breasts during the feeding can help to get rid of the air. However, teaching your baby to latch on properly is still the best way to prevent fussiness from swallowing air while breastfeeding. Signs that your baby has successfully latched on to your nipple:  Silent tugging or silent sucking, without causing you pain.  Swallowing heard between every 3-4 sucks.  Muscle movement above and in front of his or her ears while sucking.  Signs that your baby has not successfully latched on to nipple:  Sucking sounds or smacking sounds from your baby while breastfeeding.  Nipple pain.  If you think your baby has not latched on correctly, slip your finger into the corner of your baby's mouth to break the suction and place it between your baby's gums. Attempt breastfeeding initiation again. Signs of Successful Breastfeeding Signs from your  baby:  A gradual decrease in the number of sucks or complete cessation of sucking.  Falling asleep.  Relaxation of his or her body.  Retention of a small amount of milk in his or her mouth.  Letting go of your breast by himself or herself.  Signs from you:  Breasts that have increased in firmness, weight, and size 1-3 hours after feeding.  Breasts that are softer immediately after breastfeeding.  Increased milk volume, as well as a change in milk consistency and color by the fifth day of breastfeeding.  Nipples that are not sore, cracked, or bleeding.  Signs That Your Baby is Getting Enough Milk  Wetting at least 1-2 diapers during the first 24 hours after birth.  Wetting at least 5-6 diapers every 24 hours for the first week after birth. The urine should be clear or pale yellow by 5 days after birth.  Wetting 6-8 diapers every 24 hours as your baby continues to grow and develop.  At least 3 stools in a 24-hour period by age 5 days. The stool should be soft and yellow.  At least 3 stools in a 24-hour period by age 7 days. The stool should be seedy and yellow.  No loss of weight greater than 10% of birth weight during the first 3 days of age.  Average weight gain of 4-7 ounces (113-198 g) per week after age 4 days.  Consistent daily weight gain by age 5 days, without weight loss after the age of 2 weeks.  After a feeding, your baby may spit up a small amount. This is common. Breastfeeding frequency and duration Frequent feeding will help you make more milk and can prevent sore nipples and   breast engorgement. Breastfeed when you feel the need to reduce the fullness of your breasts or when your baby shows signs of hunger. This is called "breastfeeding on demand." Avoid introducing a pacifier to your baby while you are working to establish breastfeeding (the first 4-6 weeks after your baby is born). After this time you may choose to use a pacifier. Research has shown that  pacifier use during the first year of a baby's life decreases the risk of sudden infant death syndrome (SIDS). Allow your baby to feed on each breast as long as he or she wants. Breastfeed until your baby is finished feeding. When your baby unlatches or falls asleep while feeding from the first breast, offer the second breast. Because newborns are often sleepy in the first few weeks of life, you may need to awaken your baby to get him or her to feed. Breastfeeding times will vary from baby to baby. However, the following rules can serve as a guide to help you ensure that your baby is properly fed:  Newborns (babies 4 weeks of age or younger) may breastfeed every 1-3 hours.  Newborns should not go longer than 3 hours during the day or 5 hours during the night without breastfeeding.  You should breastfeed your baby a minimum of 8 times in a 24-hour period until you begin to introduce solid foods to your baby at around 6 months of age.  Breast milk pumping Pumping and storing breast milk allows you to ensure that your baby is exclusively fed your breast milk, even at times when you are unable to breastfeed. This is especially important if you are going back to work while you are still breastfeeding or when you are not able to be present during feedings. Your lactation consultant can give you guidelines on how long it is safe to store breast milk. A breast pump is a machine that allows you to pump milk from your breast into a sterile bottle. The pumped breast milk can then be stored in a refrigerator or freezer. Some breast pumps are operated by hand, while others use electricity. Ask your lactation consultant which type will work best for you. Breast pumps can be purchased, but some hospitals and breastfeeding support groups lease breast pumps on a monthly basis. A lactation consultant can teach you how to hand express breast milk, if you prefer not to use a pump. Caring for your breasts while you  breastfeed Nipples can become dry, cracked, and sore while breastfeeding. The following recommendations can help keep your breasts moisturized and healthy:  Avoid using soap on your nipples.  Wear a supportive bra. Although not required, special nursing bras and tank tops are designed to allow access to your breasts for breastfeeding without taking off your entire bra or top. Avoid wearing underwire-style bras or extremely tight bras.  Air dry your nipples for 3-4minutes after each feeding.  Use only cotton bra pads to absorb leaked breast milk. Leaking of breast milk between feedings is normal.  Use lanolin on your nipples after breastfeeding. Lanolin helps to maintain your skin's normal moisture barrier. If you use pure lanolin, you do not need to wash it off before feeding your baby again. Pure lanolin is not toxic to your baby. You may also hand express a few drops of breast milk and gently massage that milk into your nipples and allow the milk to air dry.  In the first few weeks after giving birth, some women experience extremely full breasts (engorgement).   Engorgement can make your breasts feel heavy, warm, and tender to the touch. Engorgement peaks within 3-5 days after you give birth. The following recommendations can help ease engorgement:  Completely empty your breasts while breastfeeding or pumping. You may want to start by applying warm, moist heat (in the shower or with warm water-soaked hand towels) just before feeding or pumping. This increases circulation and helps the milk flow. If your baby does not completely empty your breasts while breastfeeding, pump any extra milk after he or she is finished.  Wear a snug bra (nursing or regular) or tank top for 1-2 days to signal your body to slightly decrease milk production.  Apply ice packs to your breasts, unless this is too uncomfortable for you.  Make sure that your baby is latched on and positioned properly while  breastfeeding.  If engorgement persists after 48 hours of following these recommendations, contact your health care provider or a lactation consultant. Overall health care recommendations while breastfeeding  Eat healthy foods. Alternate between meals and snacks, eating 3 of each per day. Because what you eat affects your breast milk, some of the foods may make your baby more irritable than usual. Avoid eating these foods if you are sure that they are negatively affecting your baby.  Drink milk, fruit juice, and water to satisfy your thirst (about 10 glasses a day).  Rest often, relax, and continue to take your prenatal vitamins to prevent fatigue, stress, and anemia.  Continue breast self-awareness checks.  Avoid chewing and smoking tobacco. Chemicals from cigarettes that pass into breast milk and exposure to secondhand smoke may harm your baby.  Avoid alcohol and drug use, including marijuana. Some medicines that may be harmful to your baby can pass through breast milk. It is important to ask your health care provider before taking any medicine, including all over-the-counter and prescription medicine as well as vitamin and herbal supplements. It is possible to become pregnant while breastfeeding. If birth control is desired, ask your health care provider about options that will be safe for your baby. Contact a health care provider if:  You feel like you want to stop breastfeeding or have become frustrated with breastfeeding.  You have painful breasts or nipples.  Your nipples are cracked or bleeding.  Your breasts are red, tender, or warm.  You have a swollen area on either breast.  You have a fever or chills.  You have nausea or vomiting.  You have drainage other than breast milk from your nipples.  Your breasts do not become full before feedings by the fifth day after you give birth.  You feel sad and depressed.  Your baby is too sleepy to eat well.  Your baby is having  trouble sleeping.  Your baby is wetting less than 3 diapers in a 24-hour period.  Your baby has less than 3 stools in a 24-hour period.  Your baby's skin or the white part of his or her eyes becomes yellow.  Your baby is not gaining weight by 5 days of age. Get help right away if:  Your baby is overly tired (lethargic) and does not want to wake up and feed.  Your baby develops an unexplained fever. This information is not intended to replace advice given to you by your health care provider. Make sure you discuss any questions you have with your health care provider. Document Released: 09/03/2005 Document Revised: 02/15/2016 Document Reviewed: 02/25/2013 Else[employer] Interactive Patient Education  2017 [employer]  

## 2016-11-29 LAB — PROTEIN / CREATININE RATIO, URINE
CREATININE, UR: 253 mg/dL
Protein, Ur: 50 mg/dL
Protein/Creat Ratio: 198 mg/g creat (ref 0–200)

## 2016-11-30 LAB — COMPREHENSIVE METABOLIC PANEL
A/G RATIO: 1.3 (ref 1.2–2.2)
ALK PHOS: 63 IU/L (ref 39–117)
ALT: 15 IU/L (ref 0–32)
AST: 17 IU/L (ref 0–40)
Albumin: 4.3 g/dL (ref 3.5–5.5)
BILIRUBIN TOTAL: 0.3 mg/dL (ref 0.0–1.2)
BUN / CREAT RATIO: 8 — AB (ref 9–23)
BUN: 6 mg/dL (ref 6–20)
CHLORIDE: 99 mmol/L (ref 96–106)
CO2: 25 mmol/L (ref 18–29)
Calcium: 9.3 mg/dL (ref 8.7–10.2)
Creatinine, Ser: 0.74 mg/dL (ref 0.57–1.00)
GFR calc Af Amer: 129 mL/min/{1.73_m2} (ref >59)
GFR calc non Af Amer: 112 mL/min/{1.73_m2} (ref >59)
GLUCOSE: 93 mg/dL (ref 65–99)
Globulin, Total: 3.3 g/dL (ref 1.5–4.5)
POTASSIUM: 3.7 mmol/L (ref 3.5–5.2)
Sodium: 139 mmol/L (ref 134–144)
Total Protein: 7.6 g/dL (ref 6.0–8.5)

## 2016-11-30 LAB — CYTOLOGY - PAP
CHLAMYDIA, DNA PROBE: NEGATIVE
Diagnosis: NEGATIVE
NEISSERIA GONORRHEA: NEGATIVE

## 2016-11-30 LAB — HEMOGLOBINOPATHY EVALUATION
Ferritin: 53 ng/mL (ref 15–150)
HGB SOLUBILITY: NEGATIVE
Hgb A2 Quant: 2.4 % (ref 1.8–3.2)
Hgb A: 97.6 % (ref 96.4–98.8)
Hgb C: 0 %
Hgb F Quant: 0 % (ref 0.0–2.0)
Hgb S: 0 %
Hgb Variant: 0 %

## 2016-11-30 LAB — OBSTETRIC PANEL, INCLUDING HIV
Antibody Screen: NEGATIVE
BASOS: 0 %
Basophils Absolute: 0 10*3/uL (ref 0.0–0.2)
EOS (ABSOLUTE): 0.1 10*3/uL (ref 0.0–0.4)
EOS: 1 %
HEMATOCRIT: 38.5 % (ref 34.0–46.6)
HEMOGLOBIN: 13.3 g/dL (ref 11.1–15.9)
HIV SCREEN 4TH GENERATION: NONREACTIVE
Hepatitis B Surface Ag: NEGATIVE
IMMATURE GRANS (ABS): 0 10*3/uL (ref 0.0–0.1)
Immature Granulocytes: 0 %
LYMPHS ABS: 2.8 10*3/uL (ref 0.7–3.1)
Lymphs: 19 %
MCH: 29.8 pg (ref 26.6–33.0)
MCHC: 34.5 g/dL (ref 31.5–35.7)
MCV: 86 fL (ref 79–97)
MONOS ABS: 0.7 10*3/uL (ref 0.1–0.9)
Monocytes: 5 %
Neutrophils Absolute: 10.7 10*3/uL — ABNORMAL HIGH (ref 1.4–7.0)
Neutrophils: 75 %
Platelets: 228 10*3/uL (ref 150–379)
RBC: 4.46 x10E6/uL (ref 3.77–5.28)
RDW: 13.8 % (ref 12.3–15.4)
RH TYPE: POSITIVE
RPR Ser Ql: NONREACTIVE
Rubella Antibodies, IGG: 2.66 index (ref >0.99)
WBC: 14.3 10*3/uL — AB (ref 3.4–10.8)

## 2016-11-30 LAB — HEMOGLOBIN A1C
Est. average glucose Bld gHb Est-mCnc: 108 mg/dL
HEMOGLOBIN A1C: 5.4 % (ref 4.8–5.6)

## 2016-11-30 LAB — TSH: TSH: 0.151 u[IU]/mL — AB (ref 0.450–4.500)

## 2016-12-03 ENCOUNTER — Encounter: Payer: Self-pay | Admitting: Family Medicine

## 2016-12-03 DIAGNOSIS — O9989 Other specified diseases and conditions complicating pregnancy, childbirth and the puerperium: Secondary | ICD-10-CM

## 2016-12-03 DIAGNOSIS — R8271 Bacteriuria: Secondary | ICD-10-CM | POA: Insufficient documentation

## 2016-12-03 DIAGNOSIS — O99891 Other specified diseases and conditions complicating pregnancy: Secondary | ICD-10-CM | POA: Insufficient documentation

## 2016-12-03 LAB — URINE CULTURE, OB REFLEX

## 2016-12-03 LAB — CULTURE, OB URINE

## 2016-12-04 ENCOUNTER — Other Ambulatory Visit: Payer: Self-pay

## 2016-12-04 ENCOUNTER — Telehealth: Payer: Self-pay

## 2016-12-04 MED ORDER — CEPHALEXIN 500 MG PO CAPS: 500.0000 mg | ORAL_CAPSULE | Freq: Three times a day (TID) | ORAL | 0 refills | Status: DC

## 2016-12-04 MED ORDER — SULFAMETHOXAZOLE-TRIMETHOPRIM 800-160 MG PO TABS: 1.0000 | ORAL_TABLET | Freq: Two times a day (BID) | ORAL | 0 refills | Status: DC

## 2016-12-04 NOTE — Telephone Encounter (Signed)
Patient has been informed of test results.  

## 2016-12-04 NOTE — Telephone Encounter (Signed)
Patient tested positive for a uti. Septra ds has been called into her pharmacy.

## 2016-12-25 ENCOUNTER — Encounter: Payer: Self-pay | Admitting: Family Medicine

## 2016-12-25 ENCOUNTER — Other Ambulatory Visit: Payer: Self-pay | Admitting: Family Medicine

## 2016-12-25 DIAGNOSIS — E0590 Thyrotoxicosis, unspecified without thyrotoxic crisis or storm: Secondary | ICD-10-CM | POA: Insufficient documentation

## 2016-12-25 LAB — T4, FREE: FREE T4: 1.07 ng/dL (ref 0.82–1.77)

## 2016-12-25 LAB — T3, FREE: T3 FREE: 5.6 pg/mL — AB (ref 2.0–4.4)

## 2016-12-25 LAB — SPECIMEN STATUS REPORT

## 2016-12-25 NOTE — H&P (Signed)
Kayla Richard is an 26 y.o. G30P0020 female.   Chief Complaint: incompetent cervix HPI: here for cerclage placement  Past Medical History:  Diagnosis Date  . Chronic hypertension   . Cyst of breast   . Preterm labor     Past Surgical History:  Procedure Laterality Date  . CERVICAL CERCLAGE N/A 04/15/2015   Procedure: CERCLAGE CERVICAL;  Surgeon: Jaymes Graff, MD;  Location: [facility];  Service: Gynecology;  Laterality: N/A;    Family History  Problem Relation Age of Onset  . Hypertension Mother   . Diabetes Father    Social History:  reports that she has quit smoking. She has never used smokeless tobacco. She reports that she does not drink alcohol or use drugs.  Allergies:  Allergies  Allergen Reactions  . Mango Flavor Hives    No current facility-administered medications on file prior to encounter.    Current Outpatient Prescriptions on File Prior to Encounter  Medication Sig Dispense Refill  . acetaminophen (TYLENOL) 325 MG tablet Take 325-650 mg by mouth every 6 (six) hours as needed for mild pain.     . Prenatal Vit-Fe Fumarate-FA (MULTIVITAMIN-PRENATAL) 27-0.8 MG TABS tablet Take 1 tablet by mouth daily.     Marland Kitchen labetalol (NORMODYNE) 200 MG tablet Take 0.5 tablets (100 mg total) by mouth 2 (two) times daily. (Patient not taking: Reported on 12/21/2016) 60 tablet 3    A comprehensive review of systems was negative.  Last menstrual period 10/03/2016, unknown if currently breastfeeding. General appearance: alert, cooperative and appears stated age Head: Normocephalic, without obvious abnormality, atraumatic Neck: supple, symmetrical, trachea midline Lungs: normal effort Heart: regular rate and rhythm Abdomen: soft, non-tender; bowel sounds normal; no masses,  no organomegaly Extremities: extremities normal, atraumatic, no cyanosis or edema Skin: Skin color, texture, turgor normal. No rashes or lesions Neurologic: Grossly normal   Lab Results  Component Value Date   WBC 14.3 (H) 11/28/2016   HGB 13.5 01/15/2016   HCT 38.5 11/28/2016   MCV 86 11/28/2016   PLT 228 11/28/2016     Assessment/Plan Active Problems:   Incompetent cervix  For Cervical cerclage Risks include but are not limited to bleeding, infection, injury to surrounding structures, including bowel, bladder and ureters, blood clots, and death.  Likelihood of success is high.    Reva Bores 12/25/2016, 5:30 PM

## 2016-12-26 ENCOUNTER — Other Ambulatory Visit: Payer: Self-pay

## 2016-12-26 ENCOUNTER — Encounter (HOSPITAL_COMMUNITY): Payer: Self-pay

## 2016-12-26 ENCOUNTER — Encounter (HOSPITAL_COMMUNITY)
Admission: RE | Admit: 2016-12-26 | Discharge: 2016-12-26 | Disposition: A | Discharge status: Home / Self Care | Payer: Medicaid Other | Source: Ambulatory Visit | Attending: Family Medicine | Admitting: Family Medicine

## 2016-12-26 ENCOUNTER — Telehealth: Payer: Self-pay | Admitting: *Deleted

## 2016-12-26 DIAGNOSIS — Z01818 Encounter for other preprocedural examination: Secondary | ICD-10-CM | POA: Insufficient documentation

## 2016-12-26 DIAGNOSIS — E0590 Thyrotoxicosis, unspecified without thyrotoxic crisis or storm: Secondary | ICD-10-CM

## 2016-12-26 DIAGNOSIS — Z01812 Encounter for preprocedural laboratory examination: Secondary | ICD-10-CM | POA: Diagnosis not present

## 2016-12-26 DIAGNOSIS — O3430 Maternal care for cervical incompetence, unspecified trimester: Secondary | ICD-10-CM | POA: Insufficient documentation

## 2016-12-26 DIAGNOSIS — Z3A00 Weeks of gestation of pregnancy not specified: Secondary | ICD-10-CM | POA: Insufficient documentation

## 2016-12-26 DIAGNOSIS — O0991 Supervision of high risk pregnancy, unspecified, first trimester: Secondary | ICD-10-CM

## 2016-12-26 DIAGNOSIS — N883 Incompetence of cervix uteri: Secondary | ICD-10-CM

## 2016-12-26 LAB — BASIC METABOLIC PANEL
ANION GAP: 4 — AB (ref 5–15)
BUN: 6 mg/dL (ref 6–20)
CHLORIDE: 104 mmol/L (ref 101–111)
CO2: 27 mmol/L (ref 22–32)
Calcium: 8.9 mg/dL (ref 8.9–10.3)
Creatinine, Ser: 0.56 mg/dL (ref 0.44–1.00)
GFR calc Af Amer: >60 mL/min (ref >60)
Glucose, Bld: 110 mg/dL — ABNORMAL HIGH (ref 65–99)
POTASSIUM: 3.9 mmol/L (ref 3.5–5.1)
SODIUM: 135 mmol/L (ref 135–145)

## 2016-12-26 LAB — CBC
HCT: 39.5 % (ref 36.0–46.0)
HEMOGLOBIN: 13.3 g/dL (ref 12.0–15.0)
MCH: 29.8 pg (ref 26.0–34.0)
MCHC: 33.7 g/dL (ref 30.0–36.0)
MCV: 88.4 fL (ref 78.0–100.0)
Platelets: 207 10*3/uL (ref 150–400)
RBC: 4.47 MIL/uL (ref 3.87–5.11)
RDW: 13.3 % (ref 11.5–15.5)
WBC: 13.1 10*3/uL — AB (ref 4.0–10.5)

## 2016-12-26 NOTE — Telephone Encounter (Signed)
I called Kayla Richard and left a message I am calling with some information - please call our office during office hours.

## 2016-12-26 NOTE — Telephone Encounter (Signed)
Per message from Dr. Shawnie Pons patient has hyperthyroid and needs referral to endocrinology- also is pregnant.  I called [employer] [facility]- don't take medicaid as primary insurance. I called [facility] and they do not take medicaid. I called Dr. Laurena Slimmer office and they do accept medicaid. Made appointment for 01/08/17 11:00.

## 2016-12-26 NOTE — Telephone Encounter (Signed)
Patient called and left message stating a medication was sent to her pharmacy but she isn't sure which pharmacy and would like a callback. Called patient, no answer- left message to call us back

## 2016-12-26 NOTE — Patient Instructions (Addendum)
Your procedure is scheduled on: Friday April 20,2018 at 10:30  Enter through the Main Entrance of [facility] at: 9:00 am  Pick up the phone at the desk and dial [phone].  Call this number if you have problems the morning of surgery: [phone].  Remember: Do NOT eat food or drink any fluids after: After Midnight on Thursday 01/03/2017 Do NOT drink clear liquids after: 4:30 am Take these medicines the morning of surgery with a SIP OF WATER: Labetatol  Do NOT wear jewelry (body piercing), metal hair clips/bobby pins, make-up, or nail polish. Do NOT wear lotions, powders, or perfumes.  You may wear deoderant. Do NOT shave for 48 hours prior to surgery. Do NOT bring valuables to the hospital. Contacts, dentures, or bridgework may not be worn into surgery.  Have a responsible adult drive you home and stay with you for 24 hours after your procedure

## 2016-12-27 ENCOUNTER — Other Ambulatory Visit: Payer: Self-pay | Admitting: Family Medicine

## 2016-12-27 DIAGNOSIS — O10019 Pre-existing essential hypertension complicating pregnancy, unspecified trimester: Secondary | ICD-10-CM

## 2016-12-27 MED ORDER — LABETALOL HCL 200 MG PO TABS: 200.0000 mg | ORAL_TABLET | Freq: Two times a day (BID) | ORAL | 3 refills | Status: DC

## 2016-12-27 NOTE — Telephone Encounter (Signed)
Called and informed patient of new prescription for labetalol. Patient verbalized understanding and had no questions

## 2016-12-27 NOTE — Telephone Encounter (Signed)
Called patient and notified her of her appt with Dr Margaretmary Bayley on 4/24 @ 1100. Office address and phone number given as well. Patient voiced understanding. Patient also had a question about her bp med. She stated that she was under the impression that Dr Shawnie Pons had called in a higher dose of labetalol but neither pharmacy she used had a new prescription. Upon review of pt chart I did not see that a new prescription was sent, the original order was still on her med list. I told pt that I would message Dr Shawnie Pons to see if she wanted to make changes and call the patient back when I got a response. Understanding voiced.

## 2016-12-27 NOTE — Telephone Encounter (Signed)
Patient returned call, apologized for missing [place] again she was in a meeting at work. Would like a return call in the morning around 0900.

## 2017-01-02 ENCOUNTER — Other Ambulatory Visit: Payer: Self-pay | Admitting: Family Medicine

## 2017-01-02 ENCOUNTER — Ambulatory Visit (HOSPITAL_COMMUNITY): Admission: RE | Admit: 2017-01-02 | Payer: Medicaid Other | Source: Ambulatory Visit

## 2017-01-02 ENCOUNTER — Ambulatory Visit (HOSPITAL_COMMUNITY)
Admission: RE | Admit: 2017-01-02 | Discharge: 2017-01-02 | Disposition: A | Discharge status: Home / Self Care | Payer: Medicaid Other | Source: Ambulatory Visit | Attending: Family Medicine | Admitting: Family Medicine

## 2017-01-02 ENCOUNTER — Encounter (HOSPITAL_COMMUNITY): Payer: Self-pay

## 2017-01-02 ENCOUNTER — Encounter: Payer: Self-pay | Admitting: Obstetrics and Gynecology

## 2017-01-02 DIAGNOSIS — O0991 Supervision of high risk pregnancy, unspecified, first trimester: Secondary | ICD-10-CM

## 2017-01-02 DIAGNOSIS — O09291 Supervision of pregnancy with other poor reproductive or obstetric history, first trimester: Secondary | ICD-10-CM | POA: Insufficient documentation

## 2017-01-02 DIAGNOSIS — Z3689 Encounter for other specified antenatal screening: Secondary | ICD-10-CM | POA: Diagnosis not present

## 2017-01-02 DIAGNOSIS — O4591 Premature separation of placenta, unspecified, first trimester: Secondary | ICD-10-CM | POA: Diagnosis not present

## 2017-01-02 DIAGNOSIS — O161 Unspecified maternal hypertension, first trimester: Secondary | ICD-10-CM

## 2017-01-02 DIAGNOSIS — Z3A13 13 weeks gestation of pregnancy: Secondary | ICD-10-CM | POA: Diagnosis not present

## 2017-01-02 DIAGNOSIS — O09211 Supervision of pregnancy with history of pre-term labor, first trimester: Secondary | ICD-10-CM

## 2017-01-02 DIAGNOSIS — O10011 Pre-existing essential hypertension complicating pregnancy, first trimester: Secondary | ICD-10-CM | POA: Diagnosis not present

## 2017-01-03 ENCOUNTER — Encounter (HOSPITAL_COMMUNITY): Payer: Self-pay | Admitting: *Deleted

## 2017-01-03 ENCOUNTER — Inpatient Hospital Stay (HOSPITAL_COMMUNITY)
Admission: AD | Admit: 2017-01-03 | Discharge: 2017-01-03 | Disposition: A | Discharge status: Home / Self Care | Payer: Medicaid Other | Source: Ambulatory Visit | Attending: Obstetrics and Gynecology | Admitting: Obstetrics and Gynecology

## 2017-01-03 DIAGNOSIS — O09211 Supervision of pregnancy with history of pre-term labor, first trimester: Secondary | ICD-10-CM

## 2017-01-03 DIAGNOSIS — O039 Complete or unspecified spontaneous abortion without complication: Secondary | ICD-10-CM | POA: Diagnosis not present

## 2017-01-03 DIAGNOSIS — Z6720 Type B blood, Rh positive: Secondary | ICD-10-CM | POA: Diagnosis not present

## 2017-01-03 DIAGNOSIS — R8271 Bacteriuria: Secondary | ICD-10-CM

## 2017-01-03 DIAGNOSIS — F17210 Nicotine dependence, cigarettes, uncomplicated: Secondary | ICD-10-CM | POA: Diagnosis not present

## 2017-01-03 DIAGNOSIS — O10019 Pre-existing essential hypertension complicating pregnancy, unspecified trimester: Secondary | ICD-10-CM | POA: Diagnosis not present

## 2017-01-03 DIAGNOSIS — O99891 Other specified diseases and conditions complicating pregnancy: Secondary | ICD-10-CM

## 2017-01-03 DIAGNOSIS — O0991 Supervision of high risk pregnancy, unspecified, first trimester: Secondary | ICD-10-CM

## 2017-01-03 DIAGNOSIS — Z833 Family history of diabetes mellitus: Secondary | ICD-10-CM | POA: Diagnosis not present

## 2017-01-03 DIAGNOSIS — Z6790 Unspecified blood type, Rh positive: Secondary | ICD-10-CM

## 2017-01-03 DIAGNOSIS — O9989 Other specified diseases and conditions complicating pregnancy, childbirth and the puerperium: Secondary | ICD-10-CM

## 2017-01-03 DIAGNOSIS — Z8249 Family history of ischemic heart disease and other diseases of the circulatory system: Secondary | ICD-10-CM | POA: Insufficient documentation

## 2017-01-03 DIAGNOSIS — N939 Abnormal uterine and vaginal bleeding, unspecified: Secondary | ICD-10-CM | POA: Diagnosis present

## 2017-01-03 LAB — TYPE AND SCREEN
ABO/RH(D): B POS
Antibody Screen: NEGATIVE

## 2017-01-03 LAB — CBC
HCT: 36.8 % (ref 36.0–46.0)
Hemoglobin: 12.4 g/dL (ref 12.0–15.0)
MCH: 29.7 pg (ref 26.0–34.0)
MCHC: 33.7 g/dL (ref 30.0–36.0)
MCV: 88 fL (ref 78.0–100.0)
PLATELETS: 234 10*3/uL (ref 150–400)
RBC: 4.18 MIL/uL (ref 3.87–5.11)
RDW: 13.5 % (ref 11.5–15.5)
WBC: 16.1 10*3/uL — ABNORMAL HIGH (ref 4.0–10.5)

## 2017-01-03 LAB — HCG, QUANTITATIVE, PREGNANCY: hCG, Beta Chain, Quant, S: 2324 m[IU]/mL — ABNORMAL HIGH (ref <5)

## 2017-01-03 MED ORDER — LACTATED RINGERS IV BOLUS (SEPSIS)
1000.0000 mL | Freq: Once | INTRAVENOUS | Status: AC
  Administered 2017-01-03: 1000 mL via INTRAVENOUS

## 2017-01-03 MED ORDER — HYDROMORPHONE HCL 1 MG/ML IJ SOLN
1.0000 mg | INTRAMUSCULAR | Status: DC | PRN
  Administered 2017-01-03: 1 mg via INTRAVENOUS
  Filled 2017-01-03: qty 1

## 2017-01-03 MED ORDER — PROMETHAZINE HCL 25 MG/ML IJ SOLN
25.0000 mg | Freq: Once | INTRAMUSCULAR | Status: AC
  Administered 2017-01-03: 25 mg via INTRAVENOUS
  Filled 2017-01-03: qty 1

## 2017-01-03 MED ORDER — IBUPROFEN 800 MG PO TABS: 800.0000 mg | ORAL_TABLET | Freq: Three times a day (TID) | ORAL | 0 refills | Status: DC | PRN

## 2017-01-03 MED ORDER — LABETALOL HCL 100 MG PO TABS
200.0000 mg | ORAL_TABLET | Freq: Two times a day (BID) | ORAL | Status: DC
  Filled 2017-01-03: qty 2

## 2017-01-03 NOTE — Discharge Instructions (Signed)

## 2017-01-03 NOTE — MAU Provider Note (Signed)
History     CSN: [id number]  Arrival date and time: 01/03/17 4098   First Provider Initiated Contact with Patient 01/03/17 0754      Chief Complaint  Patient presents with  . Miscarriage   HPI Kayla Richard is a 26 y.o. G4P0020 at [redacted]w[redacted]d who presents with vaginal bleeding & abdominal pain. Symptoms began at 5 am this morning. Patient had ultrasound yesterday that confirmed demise measuring [redacted]w[redacted]d. Patient reports heavy vaginal bleeding with passage of clots and severe lower abdominal pain that she rates 9/10. Has not treated pain. Difficult to obtain history; pt not answering all questions d/t pain.   OB History    Gravida Para Term Preterm AB Living   4 1 0 0 2 0   SAB TAB Ectopic Multiple Live Births   1 1 0 0 0      Past Medical History:  Diagnosis Date  . Chronic hypertension   . Cyst of breast   . Preterm labor     Past Surgical History:  Procedure Laterality Date  . CERVICAL CERCLAGE N/A 04/15/2015   Procedure: CERCLAGE CERVICAL;  Surgeon: Jaymes Graff, MD;  Location: [facility];  Service: Gynecology;  Laterality: N/A;    Family History  Problem Relation Age of Onset  . Hypertension Mother   . Diabetes Father     Social History  Substance Use Topics  . Smoking status: Current Every Day Smoker    Packs/day: 0.25    Years: 6.00    Types: Cigarettes    Last attempt to quit: 10/28/2016  . Smokeless tobacco: Current User  . Alcohol use No    Allergies:  Allergies  Allergen Reactions  . Mango Flavor Hives    Prescriptions Prior to Admission  Medication Sig Dispense Refill Last Dose  . acetaminophen (TYLENOL) 325 MG tablet Take 325-650 mg by mouth every 6 (six) hours as needed for mild pain.    Taking  . labetalol (NORMODYNE) 200 MG tablet Take 1 tablet (200 mg total) by mouth 2 (two) times daily. 60 tablet 3 Taking  . Prenatal Vit-Fe Fumarate-FA (MULTIVITAMIN-PRENATAL) 27-0.8 MG TABS tablet Take 1 tablet by mouth daily.    Taking    Review of Systems   Gastrointestinal: Positive for abdominal pain and nausea. Negative for vomiting.  Genitourinary: Positive for vaginal bleeding.   Physical Exam   Blood pressure 109/76, pulse 81, temperature 98 F (36.7 C), temperature source Oral, resp. rate 18, last menstrual period 10/03/2016, SpO2 100 %, unknown if currently breastfeeding.  Patient Vitals for the past 24 hrs:  BP Temp Temp src Pulse Resp SpO2  01/03/17 1119 109/76 - - 81 18 -  01/03/17 1025 (!) 141/73 - - 75 18 -  01/03/17 0940 120/76 - - 80 18 -  01/03/17 0927 (!) 143/87 - - 83 18 -  01/03/17 0808 (!) 163/109 - - 81 20 -  01/03/17 0748 (!) 169/103 98 F (36.7 C) Oral 80 20 100 %    Physical Exam  Nursing note and vitals reviewed. Constitutional: She is oriented to person, place, and time. She appears well-developed and well-nourished. She appears distressed.  HENT:  Head: Normocephalic and atraumatic.  Eyes: Conjunctivae are normal. Right eye exhibits no discharge. Left eye exhibits no discharge. No scleral icterus.  Neck: Normal range of motion.  Respiratory: Effort normal. No respiratory distress.  Genitourinary: There is bleeding in the vagina.  Genitourinary Comments: Moderate amount of blood on exam. Unable to visualize cervix d/t pt discomfort.  Tissue removed from vaginal canal c/w POC. Cervix 1 cm dilated.   Neurological: She is alert and oriented to person, place, and time.  Skin: Skin is warm and dry. She is not diaphoretic.  Psychiatric: She has a normal mood and affect. Her behavior is normal. Judgment and thought content normal.   Results for orders placed or performed during the hospital encounter of 01/03/17 (from the past 24 hour(s))  CBC     Status: Abnormal   Collection Time: 01/03/17  8:32 AM  Result Value Ref Range   WBC 16.1 (H) 4.0 - 10.5 K/uL   RBC 4.18 3.87 - 5.11 MIL/uL   Hemoglobin 12.4 12.0 - 15.0 g/dL   HCT 16.1 09.6 - 04.5 %   MCV 88.0 78.0 - 100.0 fL   MCH 29.7 26.0 - 34.0 pg   MCHC 33.7  30.0 - 36.0 g/dL   RDW 40.9 81.1 - 91.4 %   Platelets 234 150 - 400 K/uL  Type and screen     Status: None   Collection Time: 01/03/17  8:32 AM  Result Value Ref Range   ABO/RH(D) B POS    Antibody Screen NEG    Sample Expiration 01/06/2017   hCG, quantitative, pregnancy     Status: Abnormal   Collection Time: 01/03/17  8:32 AM  Result Value Ref Range   hCG, Beta Chain, Quant, S 2,324 (H) <5 mIU/mL   MAU Course  Procedures  MDM Tissue sent to pathology B positive IV fluids, phenergan, dilaudid, Labetalol CBC, type & screen  Care turned over to [facility], CNM     Judeth Horn, NP 01/03/2017. 8:36 AM  0930: bleeding small, pain improved since arrival, Labetalol ordered 1100: Bleeding small, CBC stable, BP improved, pain improved. Stable for discharge home.  Assessment and Plan   1. Spontaneous abortion   2. Asymptomatic bacteriuria during pregnancy   3. Supervision of high risk pregnancy in first trimester   4. Current pregnancy with history of pre-term labor in first trimester   5. Blood type, Rh positive    Discharge home Follow up in [facility] in 1 week Bleeding/return precautions Rx Ibuprofen OOW until next week  Allergies as of 01/03/2017      Reactions   Mango Flavor Hives      Medication List    TAKE these medications   acetaminophen 325 MG tablet Commonly known as:  TYLENOL Take 325-650 mg by mouth every 6 (six) hours as needed for mild pain.   ibuprofen 800 MG tablet Commonly known as:  ADVIL,MOTRIN Take 1 tablet (800 mg total) by mouth every 8 (eight) hours as needed.   labetalol 200 MG tablet Commonly known as:  NORMODYNE Take 1 tablet (200 mg total) by mouth 2 (two) times daily.   multivitamin-prenatal 27-0.8 MG Tabs tablet Take 1 tablet by mouth daily.      Donette Larry, CNM  01/03/2017 11:23 AM

## 2017-01-03 NOTE — MAU Note (Signed)
c/o of cramping and bleeding that started @ 0500 this AM; G4P0; [redacted] weeks gestation;

## 2017-01-04 ENCOUNTER — Encounter (HOSPITAL_COMMUNITY): Admission: RE | Payer: Self-pay | Source: Ambulatory Visit

## 2017-01-04 ENCOUNTER — Ambulatory Visit (HOSPITAL_COMMUNITY): Admission: RE | Admit: 2017-01-04 | Payer: Medicaid Other | Source: Ambulatory Visit | Admitting: Family Medicine

## 2017-01-04 ENCOUNTER — Ambulatory Visit: Payer: Medicaid Other | Admitting: Obstetrics and Gynecology

## 2017-01-04 ENCOUNTER — Encounter: Payer: Self-pay | Admitting: Obstetrics and Gynecology

## 2017-01-04 SURGERY — CERCLAGE, CERVIX, VAGINAL APPROACH
Anesthesia: Choice

## 2017-04-18 ENCOUNTER — Encounter (HOSPITAL_COMMUNITY): Payer: Self-pay | Admitting: Family Medicine

## 2017-04-18 ENCOUNTER — Ambulatory Visit (HOSPITAL_COMMUNITY)
Admission: EM | Admit: 2017-04-18 | Discharge: 2017-04-18 | Disposition: A | Discharge status: Home / Self Care | Payer: Medicaid Other | Attending: Family Medicine | Admitting: Family Medicine

## 2017-04-18 DIAGNOSIS — M545 Low back pain, unspecified: Secondary | ICD-10-CM
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
## 2017-04-19 NOTE — Telephone Encounter (Signed)
See note

## 2018-01-06 ENCOUNTER — Emergency Department (HOSPITAL_COMMUNITY)
Admission: EM | Admit: 2018-01-06 | Discharge: 2018-01-07 | Disposition: A | Discharge status: Home / Self Care | Payer: Self-pay | Attending: Emergency Medicine | Admitting: Emergency Medicine

## 2018-01-06 ENCOUNTER — Encounter (HOSPITAL_COMMUNITY): Payer: Self-pay | Admitting: Emergency Medicine

## 2018-01-06 DIAGNOSIS — R110 Nausea: Secondary | ICD-10-CM | POA: Insufficient documentation

## 2018-01-06 DIAGNOSIS — I10 Essential (primary) hypertension: Secondary | ICD-10-CM | POA: Insufficient documentation

## 2018-01-06 DIAGNOSIS — Z5321 Procedure and treatment not carried out due to patient leaving prior to being seen by health care provider: Secondary | ICD-10-CM | POA: Insufficient documentation

## 2018-01-06 DIAGNOSIS — R1084 Generalized abdominal pain: Secondary | ICD-10-CM | POA: Insufficient documentation

## 2018-01-06 LAB — I-STAT BETA HCG BLOOD, ED (MC, WL, AP ONLY): I-stat hCG, quantitative: <5 m[IU]/mL (ref <5)

## 2018-01-06 LAB — COMPREHENSIVE METABOLIC PANEL
ALBUMIN: 3.4 g/dL — AB (ref 3.5–5.0)
ALK PHOS: 51 U/L (ref 38–126)
ALT: 14 U/L (ref 14–54)
AST: 17 U/L (ref 15–41)
Anion gap: 11 (ref 5–15)
BILIRUBIN TOTAL: 0.5 mg/dL (ref 0.3–1.2)
BUN: 6 mg/dL (ref 6–20)
CALCIUM: 8.7 mg/dL — AB (ref 8.9–10.3)
CO2: 25 mmol/L (ref 22–32)
CREATININE: 0.83 mg/dL (ref 0.44–1.00)
Chloride: 101 mmol/L (ref 101–111)
GFR calc Af Amer: >60 mL/min (ref >60)
GFR calc non Af Amer: >60 mL/min (ref >60)
GLUCOSE: 102 mg/dL — AB (ref 65–99)
Potassium: 3.2 mmol/L — ABNORMAL LOW (ref 3.5–5.1)
SODIUM: 137 mmol/L (ref 135–145)
Total Protein: 7.5 g/dL (ref 6.5–8.1)

## 2018-01-06 LAB — CBC
HCT: 34.4 % — ABNORMAL LOW (ref 36.0–46.0)
Hemoglobin: 11 g/dL — ABNORMAL LOW (ref 12.0–15.0)
MCH: 27.6 pg (ref 26.0–34.0)
MCHC: 32 g/dL (ref 30.0–36.0)
MCV: 86.4 fL (ref 78.0–100.0)
PLATELETS: 254 10*3/uL (ref 150–400)
RBC: 3.98 MIL/uL (ref 3.87–5.11)
RDW: 13.5 % (ref 11.5–15.5)
WBC: 13.7 10*3/uL — ABNORMAL HIGH (ref 4.0–10.5)

## 2018-01-06 LAB — URINALYSIS, ROUTINE W REFLEX MICROSCOPIC
Bilirubin Urine: NEGATIVE
GLUCOSE, UA: NEGATIVE mg/dL
Ketones, ur: NEGATIVE mg/dL
Nitrite: NEGATIVE
PH: 6 (ref 5.0–8.0)
PROTEIN: 30 mg/dL — AB
SPECIFIC GRAVITY, URINE: 1.033 — AB (ref 1.005–1.030)

## 2018-01-06 LAB — LIPASE, BLOOD: Lipase: 35 U/L (ref 11–51)

## 2018-01-06 NOTE — ED Triage Notes (Signed)
Pt reports gen abd pain X 2 weeks with nausea. Pt very vague about S/S. Also hypertensive in triage, pt states she does not take BP meds bc "she doesn't like taking them"

## 2018-01-07 ENCOUNTER — Other Ambulatory Visit: Payer: Self-pay

## 2018-01-07 ENCOUNTER — Encounter (HOSPITAL_COMMUNITY): Payer: Self-pay | Admitting: *Deleted

## 2018-01-07 ENCOUNTER — Emergency Department (HOSPITAL_COMMUNITY)
Admission: EM | Admit: 2018-01-07 | Discharge: 2018-01-07 | Disposition: A | Discharge status: Home / Self Care | Payer: Self-pay | Attending: Emergency Medicine | Admitting: Emergency Medicine

## 2018-01-07 DIAGNOSIS — F17210 Nicotine dependence, cigarettes, uncomplicated: Secondary | ICD-10-CM | POA: Insufficient documentation

## 2018-01-07 DIAGNOSIS — I10 Essential (primary) hypertension: Secondary | ICD-10-CM | POA: Insufficient documentation

## 2018-01-07 DIAGNOSIS — B9689 Other specified bacterial agents as the cause of diseases classified elsewhere: Secondary | ICD-10-CM | POA: Insufficient documentation

## 2018-01-07 DIAGNOSIS — N760 Acute vaginitis: Secondary | ICD-10-CM | POA: Insufficient documentation

## 2018-01-07 LAB — RAPID HIV SCREEN (HIV 1/2 AB+AG)
HIV 1/2 Antibodies: NONREACTIVE
HIV-1 P24 ANTIGEN - HIV24: NONREACTIVE

## 2018-01-07 LAB — WET PREP, GENITAL
Sperm: NONE SEEN
Trich, Wet Prep: NONE SEEN
Yeast Wet Prep HPF POC: NONE SEEN

## 2018-01-07 MED ORDER — METRONIDAZOLE 500 MG PO TABS: 500.0000 mg | ORAL_TABLET | Freq: Two times a day (BID) | ORAL | 0 refills | Status: DC

## 2018-01-07 MED ORDER — ACETAMINOPHEN 500 MG PO TABS: 1000.0000 mg | ORAL_TABLET | Freq: Once | ORAL | Status: DC

## 2018-01-07 NOTE — ED Triage Notes (Addendum)
To ED for eval of lower abd pain for past 2 weeks. States she was in ED last night but the wait was too long so she left after labs and urine test started in triage. Difficult to get story from pt.

## 2018-01-07 NOTE — ED Notes (Addendum)
Pt's family member asking about wait time. Pt informed of wait. Will reassess pt's vitals.

## 2018-01-07 NOTE — ED Notes (Signed)
Pt. Called for vitals reassessment... No answer x3. Pulling OTF. 

## 2018-01-07 NOTE — ED Notes (Signed)
This RN called the pt to inform her of her wet prep results and asked if she would like to come back and pick up are prescription for an antibiotic. Pt agrees and will come back to pick up papers.

## 2018-01-07 NOTE — ED Notes (Signed)
Pt and family member agitated, due to not seeing a provider (MD/PA). Pt informed that several pt's discharged at the same time and provider will see her as soon as possible.

## 2018-01-07 NOTE — ED Provider Notes (Signed)
[facility] Provider Note   CSN: [id number] Arrival date & time: 01/07/18  0754     History   Chief Complaint Chief Complaint  Patient presents with  . Abdominal Pain    HPI Bonnee QuinWhitney M Helbing is a 27 y.o. female.  HPI   Ms. Sausedo is a 27yo female with a history of hypertension, hypothyroidism, obesity who presents to the emergency department for evaluation of pelvic pain.  Patient states that she has had sharp and stabbing bilateral lower abdominal pain for the past 2 weeks now.  Reports pain comes and goes without known trigger.  Reports that radiates to her back at times.  She has not taken any over-the-counter medications for her symptoms.  States that she also has associated nausea, denies vomiting.  Denies fevers, chills, vaginal discharge or odor, diarrhea, constipation, dysuria, urinary frequency, flank pain, hematuria, melena, hematochezia, chest pain, shortness of breath, lightheadedness, syncope. Reports her last menstrual period was 12/12/2017.  Denies prior abdominal surgeries.  States that she wants to be tested for STDs today.  Reports she has one female sexual partner, denies regular condom use.  Last bowel movement was yesterday normal for her.  She states that she came to the ER last night and had labs drawn, but left prior to being seen because the wait time was so long.  Past Medical History:  Diagnosis Date  . Chronic hypertension   . Cyst of breast   . Preterm labor     Patient Active Problem List   Diagnosis Date Noted  . Hyperthyroidism 12/25/2016  . Asymptomatic bacteriuria during pregnancy 12/03/2016  . Supervision of high-risk pregnancy 11/28/2016  . Chronic hypertension 11/28/2016  . Hypertension in pregnancy, antepartum 11/28/2016  . Morbid obesity with BMI of 40.0-44.9, adult (HCC) 05/02/2015  . Incompetent cervix 04/15/2015  . Hx of PTL (preterm labor), current pregnancy 04/14/2015  . Short cervix 04/14/2015     Past Surgical History:  Procedure Laterality Date  . CERVICAL CERCLAGE N/A 04/15/2015   Procedure: CERCLAGE CERVICAL;  Surgeon: Jaymes GraffNaima Dillard, MD;  Location: [facility];  Service: Gynecology;  Laterality: N/A;     OB History    Gravida  4   Para  1   Term  0   Preterm  0   AB  2   Living  0     SAB  1   TAB  1   Ectopic  0   Multiple  0   Live Births  0            Home Medications    Prior to Admission medications   Medication Sig Start Date End Date Taking? Authorizing Provider  acetaminophen (TYLENOL) 325 MG tablet Take 325-650 mg by mouth every 6 (six) hours as needed for mild pain.     [provider]  ibuprofen (ADVIL,MOTRIN) 800 MG tablet Take 1 tablet (800 mg total) by mouth every 8 (eight) hours as needed. Patient not taking: Reported on 01/06/2018 01/03/17   Donette LarryBhambri, Melanie, CNM  labetalol (NORMODYNE) 200 MG tablet Take 1 tablet (200 mg total) by mouth 2 (two) times daily. 12/27/16   Reva BoresPratt, Tanya S, MD    Family History Family History  Problem Relation Age of Onset  . Hypertension Mother   . Diabetes Father     Social History Social History   Tobacco Use  . Smoking status: Current Every Day Smoker    Packs/day: 0.25    Years: 6.00  Pack years: 1.50    Types: Cigarettes    Last attempt to quit: 10/28/2016    Years since quitting: 1.1  . Smokeless tobacco: Current User  Substance Use Topics  . Alcohol use: No  . Drug use: No     Allergies   Mango flavor   Review of Systems Review of Systems  Constitutional: Negative for chills and fever.  HENT: Negative for congestion.   Respiratory: Negative for shortness of breath.   Cardiovascular: Negative for chest pain.  Gastrointestinal: Positive for nausea. Negative for blood in stool, constipation, diarrhea and vomiting.  Genitourinary: Positive for pelvic pain. Negative for difficulty urinating, dysuria, frequency, hematuria, menstrual problem, vaginal bleeding and vaginal  discharge.  Musculoskeletal: Negative for gait problem.  Skin: Negative for rash.  Neurological: Negative for dizziness, weakness, light-headedness and numbness.  Psychiatric/Behavioral: Negative for agitation.     Physical Exam Updated Vital Signs BP (!) 147/94 (BP Location: Right Arm)   Pulse 86   Temp 98.3 F (36.8 C) (Oral)   Resp 18   SpO2 95%   Physical Exam  Constitutional: She is oriented to person, place, and time. She appears well-developed and well-nourished. No distress.  Sitting at bedside in no apparent distress, nontoxic-appearing.  HENT:  Head: Normocephalic and atraumatic.  Mouth/Throat: Oropharynx is clear and moist. No oropharyngeal exudate.  Mucous membranes moist.  Eyes: Pupils are equal, round, and reactive to light. Conjunctivae are normal. Right eye exhibits no discharge. Left eye exhibits no discharge.  Neck: Normal range of motion. Neck supple.  Cardiovascular: Normal rate, regular rhythm and intact distal pulses. Exam reveals no friction rub.  No murmur heard. Pulmonary/Chest: Effort normal. No respiratory distress.  Abdominal:  Abdomen soft and nondistended.  Patient mildly tender to palpation in suprapubic area.  No guarding or rigidity.  No rebound tenderness.  No CVA tenderness.  Negative McBurney's point.  Negative Murphy sign.  Genitourinary:  Genitourinary Comments: Chaperone present for exam.  White malodorous milky discharge present. No CMT. No adnexal masses, tenderness, or fullness.  No bleeding within vaginal vault.  Neurological: She is alert and oriented to person, place, and time. Coordination normal.  Skin: Skin is warm and dry. Capillary refill takes less than 2 seconds. She is not diaphoretic.  Psychiatric: She has a normal mood and affect. Her behavior is normal.  Nursing note and vitals reviewed.    ED Treatments / Results  Labs (all labs ordered are listed, but only abnormal results are displayed) Labs Reviewed  WET PREP,  GENITAL - Abnormal; Notable for the following components:      Result Value   Clue Cells Wet Prep HPF POC PRESENT (*)    WBC, Wet Prep HPF POC MANY (*)    All other components within normal limits  RAPID HIV SCREEN (HIV 1/2 AB+AG)  RPR  GC/CHLAMYDIA PROBE AMP ([place]) NOT AT [facility]    EKG None  Radiology No results found.  Procedures Procedures (including critical care time)  Medications Ordered in ED Medications  acetaminophen (TYLENOL) tablet 1,000 mg (has no administration in time range)     Initial Impression / Assessment and Plan / ED Course  I have reviewed the triage vital signs and the nursing notes.  Pertinent labs & imaging results that were available during my care of the patient were reviewed by me and considered in my medical decision making (see chart for details).     Patient presents with pelvic pain which has been intermittent for the  past 2 weeks now.  She endorses associated nausea.  Is asking to be tested for STDs.  She had lab work drawn yesterday evening, but left prior to being seen.  She denies acute worsening since yesterday evening.  On exam she is afebrile and nontoxic-appearing.  Vital signs stable.  She has some mild tenderness in the suprapubic area, no guarding or rigidity.  No peritoneal signs.  Pelvic exam reveals copious amounts of white milky discharge which is malodorous.  No CMT or adnexal tenderness.  Reviewed lab work from yesterday evening.  She has a slight leukocytosis with WBC count of 13.7, this appears to be chronic and baseline for her.  Otherwise CBC unremarkable.  CMP reveals mild hypokalemia with potassium of 3.2, do not believe that this is contributing to her acute visit.  Liver enzymes and kidney function normal.  Lipase within normal limits.  Beta hCG negative.  Her UA appears contaminated and patient denies dysuria, urinary frequency.  Do not suspect UTI contributing to her current visit. Given patient denies acute change in  her symptoms from when she had lab work performed, do not think repeat blood work is necessary at this time.  Wet prep reviewed, shows clue cells.  This is consistent with exam findings and likely contributing to her pelvic pain.  We will treat for bv with metronidazole.  No concern for PID given no CMT or adnexal tenderness on exam.  Do not suspect acute appendicitis, cholecystitis, diverticulitis, perforated viscus, bowel obstruction, nephrolithiasis given presentation and lab work.  Do not think that further imaging is necessary given relatively benign abdominal exam and lab work.  Have counseled patient that she has GC/chlamydia, HIV and syphilis testing which is pending and she will get a phone call if this is positive.  She will need to go to the health department to be treated if results return positive.  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  She did not receive her metronidazole prescription.  RN Orvil Feil contacted patient on the phone who states that she will return to pick up her antibiotic prescription and discharge paperwork.    Final Clinical Impressions(s) / ED Diagnoses   Final diagnoses:  Bacterial vaginosis    ED Discharge Orders        Ordered    metroNIDAZOLE (FLAGYL) 500 MG tablet  2 times daily,   Status:  Discontinued     01/07/18 1353    metroNIDAZOLE (FLAGYL) 500 MG tablet  2 times daily     01/07/18 1519       Lawrence Marseilles 01/07/18 1710    Mabe, Latanya Maudlin, MD 01/09/18 1620

## 2018-01-07 NOTE — ED Notes (Signed)
Pt stated that she had to go to work. Pt left. Unable to get discharge vitals.

## 2018-01-07 NOTE — Discharge Instructions (Addendum)
You have bacterial vaginosis.  Please take antibiotic twice a day for the next 7 days.   Your blood work was reassuring.  As we discussed you have chlamydia, gonorrhea, HIV and syphilis testing which is pending.  You will receive a phone call if results are positive.  If positive, you will need to go to the health department to be treated.  I have listed information to the health department in this packet.  Your blood pressure was elevated in the ER today, please have this rechecked by regular doctor.  You can use information at the back of this packet to establish care with a regular doctor, it is important that you do this as we discussed.  Return to the emergency department if you have any new or concerning symptoms like vomiting that will not stop, worsening abdominal pain or fever greater than 100.4 F.

## 2018-01-08 LAB — GC/CHLAMYDIA PROBE AMP (~~LOC~~) NOT AT ARMC
Chlamydia: POSITIVE — AB
NEISSERIA GONORRHEA: NEGATIVE

## 2018-01-08 LAB — RPR: RPR Ser Ql: NONREACTIVE

## 2018-01-11 ENCOUNTER — Telehealth: Payer: Self-pay | Admitting: Medical

## 2018-01-11 DIAGNOSIS — A749 Chlamydial infection, unspecified: Secondary | ICD-10-CM

## 2018-01-11 MED ORDER — AZITHROMYCIN 250 MG PO TABS: 1000.0000 mg | ORAL_TABLET | Freq: Once | ORAL | 0 refills | Status: AC

## 2018-01-11 NOTE — Telephone Encounter (Addendum)
Kayla Richard tested positive for  Chlamydia. Patient was called by RN and allergies and pharmacy confirmed. Rx sent to pharmacy of choice.   [name] 01/11/2018 9:28 PM      ----- Message from Kathe Becton, RN sent at 01/10/2018 10:10 AM EDT ----- This patient tested positive for :  Chlamydia   She "has NKDA", I have informed the patient of her results and confirmed her pharmacy is correct in her chart. Please send Rx.   Thank you,   Kathe Becton, RN   Results faxed to [facility].

## 2018-06-30 ENCOUNTER — Encounter (HOSPITAL_COMMUNITY): Payer: Self-pay

## 2018-06-30 ENCOUNTER — Ambulatory Visit (HOSPITAL_COMMUNITY)
Admission: EM | Admit: 2018-06-30 | Discharge: 2018-06-30 | Disposition: A | Discharge status: Home / Self Care | Payer: Self-pay | Attending: Family Medicine | Admitting: Family Medicine

## 2018-06-30 DIAGNOSIS — I10 Essential (primary) hypertension: Secondary | ICD-10-CM

## 2018-06-30 DIAGNOSIS — R519 Headache, unspecified: Secondary | ICD-10-CM

## 2018-06-30 DIAGNOSIS — R51 Headache: Secondary | ICD-10-CM

## 2018-06-30 MED ORDER — KETOROLAC TROMETHAMINE 60 MG/2ML IM SOLN
INTRAMUSCULAR | Status: AC
  Filled 2018-06-30: qty 2

## 2018-06-30 MED ORDER — DEXAMETHASONE SODIUM PHOSPHATE 10 MG/ML IJ SOLN
INTRAMUSCULAR | Status: AC
  Filled 2018-06-30: qty 1

## 2018-06-30 MED ORDER — DEXAMETHASONE SODIUM PHOSPHATE 10 MG/ML IJ SOLN
10.0000 mg | Freq: Once | INTRAMUSCULAR | Status: AC
  Administered 2018-06-30: 10 mg via INTRAMUSCULAR

## 2018-06-30 MED ORDER — METOCLOPRAMIDE HCL 5 MG/ML IJ SOLN
INTRAMUSCULAR | Status: AC
  Filled 2018-06-30: qty 2

## 2018-06-30 MED ORDER — METOCLOPRAMIDE HCL 5 MG/ML IJ SOLN
5.0000 mg | Freq: Once | INTRAMUSCULAR | Status: AC
  Administered 2018-06-30: 5 mg via INTRAMUSCULAR

## 2018-06-30 MED ORDER — SUMATRIPTAN SUCCINATE 6 MG/0.5ML ~~LOC~~ SOLN
SUBCUTANEOUS | Status: AC
  Filled 2018-06-30: qty 0.5

## 2018-06-30 MED ORDER — SUMATRIPTAN SUCCINATE 6 MG/0.5ML ~~LOC~~ SOLN
6.0000 mg | Freq: Once | SUBCUTANEOUS | Status: AC
  Administered 2018-06-30: 6 mg via SUBCUTANEOUS

## 2018-06-30 MED ORDER — KETOROLAC TROMETHAMINE 60 MG/2ML IM SOLN
60.0000 mg | Freq: Once | INTRAMUSCULAR | Status: AC
  Administered 2018-06-30: 60 mg via INTRAMUSCULAR

## 2018-06-30 NOTE — Discharge Instructions (Addendum)
Meds ordered this encounter  Medications   ketorolac (TORADOL) injection 60 mg   metoCLOPramide (REGLAN) injection 5 mg   dexamethasone (DECADRON) injection 10 mg   SUMAtriptan (IMITREX) injection 6 mg   Do your best to ensure adequate rest. If not allergic, take acetaminophen (Tylenol) every 4-6 hours as needed for discomfort.   Please seek prompt medical care if: You have: A worsening (severe) headache that is not helped by medicine. Trouble walking or weakness in your arms and legs. Changes in your seeing (vision). Jerky movements that you cannot control (seizure). You throw up (vomit). You lose balance. Your speech is slurred. You pass out. You are sleepier and have trouble staying awake. The black centers of your eyes (pupils) change in size.

## 2018-06-30 NOTE — ED Provider Notes (Signed)
[facility] CARE CENTER   [id number] 06/30/18 Arrival Time: 1209  ASSESSMENT & PLAN:  1. Bad headache   2. Hypertension, uncontrolled     Meds ordered this encounter  Medications  . ketorolac (TORADOL) injection 60 mg  . metoCLOPramide (REGLAN) injection 5 mg  . dexamethasone (DECADRON) injection 10 mg  . SUMAtriptan (IMITREX) injection 6 mg   Continue current BP medication and monitor BP outside of clinic. Work note given. Ensure adequate fluid intake and rest.  Follow-up Information    [facility].   Specialty:  Urgent Care Why:  As needed. Contact information: [address] [place] [address] [phone]       [facility] EMERGENCY DEPARTMENT.   Specialty:  Emergency Medicine Why:  If symptoms worsen. Contact information: [address] [id number] mc [place] [address] [phone]         Reviewed expectations re: course of current medical issues. Questions answered. Outlined signs and symptoms indicating need for more acute intervention. Patient verbalized understanding. After Visit Summary given.   SUBJECTIVE:  Kayla Richard is a 27 y.o. female who presents with complaint of an intermitent "bad headache" without aura. Reports gradual onset 1 week ago. Describes as "throbbing" when present. May last a few hours, sometimes the whole day. Does not wake her from sleep. Ambulatory without difficulty. Location: generalized without radiation. History of headaches: yes; similar but infrequent. Precipitating factors include: none which have been determined. Associated symptoms: Nausea/vomiting: mild nausea; one emesis a few days ago; none since. Vision changes: no. Increased sensitivity to light and to noises: no. Fever: no. Sinus pressure/congestion: no. Extremity weakness: no. Home treatment has included ibuprofen with little improvement. Current headache does not  limit normal daily activities. The patient denies depression, dizziness, loss of balance, muscle weakness, numbness of extremities, speech difficulties and vision problems. No new medications.   ROS: As per HPI. All other systems negative.   OBJECTIVE:  Vitals:   06/30/18 1257  BP: (!) 166/103  Pulse: 72  Resp: 20  Temp: 98.2 F (36.8 C)  TempSrc: Oral  SpO2: 99%   Increased BP noted.  General appearance: alert; no distress but appears fatigued      Eyes: PERRLA; EOMI; conjunctiva normal HENT: normocephalic; atraumatic; no sinus tenderness Neck: supple with FROM Lungs: clear to auscultation bilaterally; unlaboree Heart: regular rate and rhythm without murmer Extremities: no edema; symmetrical with no gross deformities Skin: warm and dry; no rashes Neurologic: CN 2-12 grossly intact; normal gait; normal symmetric reflexes; normal extremity strength and sensation throughout Psychological: alert and cooperative; normal mood and affect   Allergies  Allergen Reactions  . Bee Venom   . Mango Flavor Hives    Past Medical History:  Diagnosis Date  . Chronic hypertension   . Cyst of breast   . Preterm labor    Social History   Socioeconomic History  . Marital status: Legally Separated    Spouse name: Not on file  . Number of children: Not on file  . Years of education: Not on file  . Highest education level: Not on file  Occupational History  . Not on file  Social Needs  . Financial resource strain: Not on file  . Food insecurity:    Worry: Not on file    Inability: Not on file  . Transportation needs:    Medical: Not on file    Non-medical: Not on file  Tobacco Use  .  Smoking status: Current Every Day Smoker    Packs/day: 0.25    Years: 6.00    Pack years: 1.50    Types: Cigarettes    Last attempt to quit: 10/28/2016    Years since quitting: 1.6  . Smokeless tobacco: Current User  Substance and Sexual Activity  . Alcohol use: No  . Drug use: No  .  Sexual activity: Yes    Birth control/protection: None  Lifestyle  . Physical activity:    Days per week: Not on file    Minutes per session: Not on file  . Stress: Not on file  Relationships  . Social connections:    Talks on phone: Not on file    Gets together: Not on file    Attends religious service: Not on file    Active member of club or organization: Not on file    Attends meetings of clubs or organizations: Not on file    Relationship status: Not on file  . Intimate partner violence:    Fear of current or ex partner: Not on file    Emotionally abused: Not on file    Physically abused: Not on file    Forced sexual activity: Not on file  Other Topics Concern  . Not on file  Social History Narrative  . Not on file   Family History  Problem Relation Age of Onset  . Hypertension Mother   . Diabetes Father    Past Surgical History:  Procedure Laterality Date  . CERVICAL CERCLAGE N/A 04/15/2015   Procedure: CERCLAGE CERVICAL;  Surgeon: Jaymes Graff, MD;  Location: [facility];  Service: Gynecology;  Laterality: N/AMardella Layman, MD 06/30/18 1338

## 2018-06-30 NOTE — ED Triage Notes (Signed)
Pt presents with severe headaches and blurred vision.

## 2019-01-18 ENCOUNTER — Emergency Department (HOSPITAL_COMMUNITY): Admission: EM | Admit: 2019-01-18 | Discharge: 2019-01-18 | Discharge status: Absent without leave | Payer: Self-pay

## 2019-01-18 ENCOUNTER — Emergency Department (HOSPITAL_COMMUNITY): Admission: EM | Admit: 2019-01-18 | Discharge: 2019-01-18 | Discharge status: Against Medical Advice | Payer: Self-pay

## 2019-01-18 NOTE — ED Notes (Signed)
Patient was at registration checking in and when staff member attempted to place arm band on, patient stated oh no and walked out. Registration staff reports that th epatien twas arguing with her significant other from the time she walked in

## 2019-02-14 ENCOUNTER — Emergency Department (HOSPITAL_COMMUNITY): Payer: No Typology Code available for payment source

## 2019-02-14 ENCOUNTER — Emergency Department (HOSPITAL_COMMUNITY)
Admission: EM | Admit: 2019-02-14 | Discharge: 2019-02-14 | Disposition: A | Discharge status: Home / Self Care | Payer: No Typology Code available for payment source | Attending: Emergency Medicine | Admitting: Emergency Medicine

## 2019-02-14 ENCOUNTER — Encounter (HOSPITAL_COMMUNITY): Payer: Self-pay | Admitting: Emergency Medicine

## 2019-02-14 ENCOUNTER — Other Ambulatory Visit: Payer: Self-pay

## 2019-02-14 DIAGNOSIS — Y998 Other external cause status: Secondary | ICD-10-CM | POA: Insufficient documentation

## 2019-02-14 DIAGNOSIS — Y9389 Activity, other specified: Secondary | ICD-10-CM | POA: Diagnosis not present

## 2019-02-14 DIAGNOSIS — Y9289 Other specified places as the place of occurrence of the external cause: Secondary | ICD-10-CM | POA: Diagnosis not present

## 2019-02-14 DIAGNOSIS — F17228 Nicotine dependence, chewing tobacco, with other nicotine-induced disorders: Secondary | ICD-10-CM | POA: Insufficient documentation

## 2019-02-14 DIAGNOSIS — S82301A Unspecified fracture of lower end of right tibia, initial encounter for closed fracture: Secondary | ICD-10-CM | POA: Diagnosis not present

## 2019-02-14 DIAGNOSIS — F17210 Nicotine dependence, cigarettes, uncomplicated: Secondary | ICD-10-CM | POA: Insufficient documentation

## 2019-02-14 DIAGNOSIS — Z23 Encounter for immunization: Secondary | ICD-10-CM | POA: Insufficient documentation

## 2019-02-14 DIAGNOSIS — S80811A Abrasion, right lower leg, initial encounter: Secondary | ICD-10-CM | POA: Insufficient documentation

## 2019-02-14 DIAGNOSIS — I10 Essential (primary) hypertension: Secondary | ICD-10-CM | POA: Diagnosis not present

## 2019-02-14 DIAGNOSIS — S8991XA Unspecified injury of right lower leg, initial encounter: Secondary | ICD-10-CM | POA: Diagnosis present

## 2019-02-14 MED ORDER — TETANUS-DIPHTH-ACELL PERTUSSIS 5-2.5-18.5 LF-MCG/0.5 IM SUSP
0.5000 mL | Freq: Once | INTRAMUSCULAR | Status: AC
  Administered 2019-02-14: 0.5 mL via INTRAMUSCULAR
  Filled 2019-02-14: qty 0.5

## 2019-02-14 MED ORDER — BACITRACIN ZINC 500 UNIT/GM EX OINT
1.0000 "application " | TOPICAL_OINTMENT | Freq: Once | CUTANEOUS | Status: AC
  Administered 2019-02-14: 1 via TOPICAL
  Filled 2019-02-14: qty 4.5

## 2019-02-14 MED ORDER — NAPROXEN 500 MG PO TABS: 500.0000 mg | ORAL_TABLET | Freq: Two times a day (BID) | ORAL | 0 refills | Status: DC

## 2019-02-14 MED ORDER — ONDANSETRON 4 MG PO TBDP
4.0000 mg | ORAL_TABLET | Freq: Once | ORAL | Status: AC
  Administered 2019-02-14: 4 mg via ORAL
  Filled 2019-02-14: qty 1

## 2019-02-14 MED ORDER — HYDROCODONE-ACETAMINOPHEN 5-325 MG PO TABS
1.0000 | ORAL_TABLET | ORAL | Status: AC
  Administered 2019-02-14: 18:00:00 1 via ORAL
  Filled 2019-02-14: qty 1
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
## 2019-02-14 NOTE — Discharge Instructions (Addendum)
Use the crutches and wear the splint for support, apply antibiotic ointment and redressed the wounds daily, follow-up with an orthopedic doctor for further evaluation the fractures noted on x-ray

## 2019-02-18 ENCOUNTER — Other Ambulatory Visit: Payer: Self-pay

## 2019-02-18 ENCOUNTER — Encounter (HOSPITAL_COMMUNITY): Payer: Self-pay | Admitting: Emergency Medicine

## 2019-02-18 ENCOUNTER — Emergency Department (HOSPITAL_COMMUNITY)
Admission: EM | Admit: 2019-02-18 | Discharge: 2019-02-18 | Disposition: A | Discharge status: Home / Self Care | Payer: Self-pay | Attending: Emergency Medicine | Admitting: Emergency Medicine

## 2019-02-18 DIAGNOSIS — Z4800 Encounter for change or removal of nonsurgical wound dressing: Secondary | ICD-10-CM | POA: Insufficient documentation

## 2019-02-18 DIAGNOSIS — I10 Essential (primary) hypertension: Secondary | ICD-10-CM | POA: Insufficient documentation

## 2019-02-18 DIAGNOSIS — F17210 Nicotine dependence, cigarettes, uncomplicated: Secondary | ICD-10-CM | POA: Insufficient documentation

## 2019-02-18 DIAGNOSIS — Z3202 Encounter for pregnancy test, result negative: Secondary | ICD-10-CM | POA: Insufficient documentation

## 2019-02-18 DIAGNOSIS — Z5189 Encounter for other specified aftercare: Secondary | ICD-10-CM

## 2019-02-18 LAB — POC URINE PREG, ED: Preg Test, Ur: NEGATIVE

## 2019-02-18 MED ORDER — CEPHALEXIN 500 MG PO CAPS: 500.0000 mg | ORAL_CAPSULE | Freq: Four times a day (QID) | ORAL | 0 refills | Status: DC

## 2019-02-18 MED ORDER — HYDROCODONE-ACETAMINOPHEN 5-325 MG PO TABS
1.0000 | ORAL_TABLET | Freq: Once | ORAL | Status: AC
  Administered 2019-02-18: 1 via ORAL
  Filled 2019-02-18: qty 1

## 2019-02-18 NOTE — ED Notes (Signed)
Bed: WTR6 Expected date:  Expected time:  Means of arrival:  Comments: 

## 2019-02-18 NOTE — ED Notes (Signed)
Old dressing patient came in with removed, wound site cleaned.
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
## 2019-02-18 NOTE — Discharge Instructions (Signed)
Continue with tylenol or ibuprofen at home as needed for pain.  Apply neosporin cream to wound twice daily.  If you notice increase redness or pain or sign of infection, then take antibiotic prescribed.

## 2019-03-29 ENCOUNTER — Other Ambulatory Visit: Payer: Self-pay

## 2019-03-29 ENCOUNTER — Encounter (HOSPITAL_COMMUNITY): Payer: Self-pay

## 2019-03-29 ENCOUNTER — Emergency Department (HOSPITAL_COMMUNITY): Payer: Self-pay

## 2019-03-29 ENCOUNTER — Inpatient Hospital Stay (HOSPITAL_COMMUNITY)
Admission: EM | Admit: 2019-03-29 | Discharge: 2019-04-03 | DRG: 683 | Disposition: A | Discharge status: Home / Self Care | Payer: Self-pay | Attending: Internal Medicine | Admitting: Internal Medicine

## 2019-03-29 DIAGNOSIS — I10 Essential (primary) hypertension: Secondary | ICD-10-CM | POA: Diagnosis present

## 2019-03-29 DIAGNOSIS — W19XXXA Unspecified fall, initial encounter: Secondary | ICD-10-CM

## 2019-03-29 DIAGNOSIS — E039 Hypothyroidism, unspecified: Secondary | ICD-10-CM | POA: Diagnosis present

## 2019-03-29 DIAGNOSIS — Z833 Family history of diabetes mellitus: Secondary | ICD-10-CM

## 2019-03-29 DIAGNOSIS — M6282 Rhabdomyolysis: Secondary | ICD-10-CM | POA: Diagnosis present

## 2019-03-29 DIAGNOSIS — Z6841 Body Mass Index (BMI) 40.0 and over, adult: Secondary | ICD-10-CM

## 2019-03-29 DIAGNOSIS — T796XXA Traumatic ischemia of muscle, initial encounter: Secondary | ICD-10-CM

## 2019-03-29 DIAGNOSIS — N390 Urinary tract infection, site not specified: Secondary | ICD-10-CM | POA: Diagnosis present

## 2019-03-29 DIAGNOSIS — W06XXXA Fall from bed, initial encounter: Secondary | ICD-10-CM | POA: Diagnosis present

## 2019-03-29 DIAGNOSIS — Z720 Tobacco use: Secondary | ICD-10-CM | POA: Diagnosis present

## 2019-03-29 DIAGNOSIS — R7989 Other specified abnormal findings of blood chemistry: Secondary | ICD-10-CM | POA: Diagnosis present

## 2019-03-29 DIAGNOSIS — K5900 Constipation, unspecified: Secondary | ICD-10-CM | POA: Diagnosis present

## 2019-03-29 DIAGNOSIS — N179 Acute kidney failure, unspecified: Principal | ICD-10-CM

## 2019-03-29 DIAGNOSIS — Z8249 Family history of ischemic heart disease and other diseases of the circulatory system: Secondary | ICD-10-CM

## 2019-03-29 DIAGNOSIS — E0590 Thyrotoxicosis, unspecified without thyrotoxic crisis or storm: Secondary | ICD-10-CM | POA: Diagnosis present

## 2019-03-29 DIAGNOSIS — D72829 Elevated white blood cell count, unspecified: Secondary | ICD-10-CM | POA: Diagnosis present

## 2019-03-29 DIAGNOSIS — S9492XA Injury of unspecified nerve at ankle and foot level, left leg, initial encounter: Secondary | ICD-10-CM | POA: Diagnosis present

## 2019-03-29 DIAGNOSIS — Z1159 Encounter for screening for other viral diseases: Secondary | ICD-10-CM

## 2019-03-29 DIAGNOSIS — F17210 Nicotine dependence, cigarettes, uncomplicated: Secondary | ICD-10-CM | POA: Diagnosis present

## 2019-03-29 DIAGNOSIS — R202 Paresthesia of skin: Secondary | ICD-10-CM

## 2019-03-29 DIAGNOSIS — S82891D Other fracture of right lower leg, subsequent encounter for closed fracture with routine healing: Secondary | ICD-10-CM

## 2019-03-29 DIAGNOSIS — R569 Unspecified convulsions: Secondary | ICD-10-CM

## 2019-03-29 MED ORDER — GABAPENTIN 100 MG PO CAPS
100.0000 mg | ORAL_CAPSULE | Freq: Once | ORAL | Status: AC
  Administered 2019-03-30: 100 mg via ORAL
  Filled 2019-03-29: qty 1

## 2019-03-29 MED ORDER — PREDNISONE 20 MG PO TABS
60.0000 mg | ORAL_TABLET | Freq: Once | ORAL | Status: AC
  Administered 2019-03-30: 60 mg via ORAL
  Filled 2019-03-29: qty 3
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
## 2019-03-29 NOTE — ED Notes (Signed)
Patient walked to doorway and yelled at staff for water. Instructed to get back in bed and to not get up without assistance. Patient assisted to bed and water provided. Xray at bedside now. Will continue to monitor patient.

## 2019-03-30 ENCOUNTER — Inpatient Hospital Stay (HOSPITAL_COMMUNITY): Payer: Self-pay

## 2019-03-30 DIAGNOSIS — D72829 Elevated white blood cell count, unspecified: Secondary | ICD-10-CM | POA: Diagnosis present

## 2019-03-30 DIAGNOSIS — M6282 Rhabdomyolysis: Secondary | ICD-10-CM | POA: Diagnosis present

## 2019-03-30 DIAGNOSIS — R202 Paresthesia of skin: Secondary | ICD-10-CM

## 2019-03-30 DIAGNOSIS — Z6841 Body Mass Index (BMI) 40.0 and over, adult: Secondary | ICD-10-CM

## 2019-03-30 DIAGNOSIS — W19XXXA Unspecified fall, initial encounter: Secondary | ICD-10-CM

## 2019-03-30 DIAGNOSIS — N179 Acute kidney failure, unspecified: Principal | ICD-10-CM

## 2019-03-30 DIAGNOSIS — Z720 Tobacco use: Secondary | ICD-10-CM

## 2019-03-30 DIAGNOSIS — I10 Essential (primary) hypertension: Secondary | ICD-10-CM

## 2019-03-30 DIAGNOSIS — T796XXA Traumatic ischemia of muscle, initial encounter: Secondary | ICD-10-CM

## 2019-03-30 LAB — BASIC METABOLIC PANEL
Anion gap: 13 (ref 5–15)
Anion gap: 14 (ref 5–15)
BUN: 26 mg/dL — ABNORMAL HIGH (ref 6–20)
BUN: 30 mg/dL — ABNORMAL HIGH (ref 6–20)
CO2: 22 mmol/L (ref 22–32)
CO2: 24 mmol/L (ref 22–32)
Calcium: 7.1 mg/dL — ABNORMAL LOW (ref 8.9–10.3)
Calcium: 7.9 mg/dL — ABNORMAL LOW (ref 8.9–10.3)
Chloride: 101 mmol/L (ref 98–111)
Chloride: 105 mmol/L (ref 98–111)
Creatinine, Ser: 1.8 mg/dL — ABNORMAL HIGH (ref 0.44–1.00)
Creatinine, Ser: 2.18 mg/dL — ABNORMAL HIGH (ref 0.44–1.00)
GFR calc Af Amer: 35 mL/min — ABNORMAL LOW (ref >60)
GFR calc Af Amer: 44 mL/min — ABNORMAL LOW (ref >60)
GFR calc non Af Amer: 30 mL/min — ABNORMAL LOW (ref >60)
GFR calc non Af Amer: 38 mL/min — ABNORMAL LOW (ref >60)
Glucose, Bld: 120 mg/dL — ABNORMAL HIGH (ref 70–99)
Glucose, Bld: 123 mg/dL — ABNORMAL HIGH (ref 70–99)
Potassium: 3.9 mmol/L (ref 3.5–5.1)
Potassium: 4.3 mmol/L (ref 3.5–5.1)
Sodium: 139 mmol/L (ref 135–145)
Sodium: 140 mmol/L (ref 135–145)

## 2019-03-30 LAB — CK
Total CK: >50000 U/L — ABNORMAL HIGH (ref 38–234)
Total CK: >50000 U/L — ABNORMAL HIGH (ref 38–234)
Total CK: 48417 U/L — ABNORMAL HIGH (ref 38–234)

## 2019-03-30 LAB — MAGNESIUM: Magnesium: 1.8 mg/dL (ref 1.7–2.4)

## 2019-03-30 LAB — TSH: TSH: 1.387 u[IU]/mL (ref 0.350–4.500)

## 2019-03-30 LAB — URINALYSIS, ROUTINE W REFLEX MICROSCOPIC
Bilirubin Urine: NEGATIVE
Glucose, UA: 50 mg/dL — AB
Ketones, ur: NEGATIVE mg/dL
Leukocytes,Ua: NEGATIVE
Nitrite: NEGATIVE
Protein, ur: 100 mg/dL — AB
Specific Gravity, Urine: 1.015 (ref 1.005–1.030)
pH: 6 (ref 5.0–8.0)

## 2019-03-30 LAB — CBC WITH DIFFERENTIAL/PLATELET
Abs Immature Granulocytes: 0.19 10*3/uL — ABNORMAL HIGH (ref 0.00–0.07)
Basophils Absolute: 0 10*3/uL (ref 0.0–0.1)
Basophils Relative: 0 %
Eosinophils Absolute: 0.3 10*3/uL (ref 0.0–0.5)
Eosinophils Relative: 1 %
HCT: 43.5 % (ref 36.0–46.0)
Hemoglobin: 14.5 g/dL (ref 12.0–15.0)
Immature Granulocytes: 1 %
Lymphocytes Relative: 10 %
Lymphs Abs: 2.4 10*3/uL (ref 0.7–4.0)
MCH: 30.5 pg (ref 26.0–34.0)
MCHC: 33.3 g/dL (ref 30.0–36.0)
MCV: 91.6 fL (ref 80.0–100.0)
Monocytes Absolute: 1.7 10*3/uL — ABNORMAL HIGH (ref 0.1–1.0)
Monocytes Relative: 7 %
Neutro Abs: 19.7 10*3/uL — ABNORMAL HIGH (ref 1.7–7.7)
Neutrophils Relative %: 81 %
Platelets: 286 10*3/uL (ref 150–400)
RBC: 4.75 MIL/uL (ref 3.87–5.11)
RDW: 13 % (ref 11.5–15.5)
WBC: 24.4 10*3/uL — ABNORMAL HIGH (ref 4.0–10.5)
nRBC: 0 % (ref 0.0–0.2)

## 2019-03-30 LAB — HEPATIC FUNCTION PANEL
ALT: 256 U/L — ABNORMAL HIGH (ref 0–44)
AST: 664 U/L — ABNORMAL HIGH (ref 15–41)
Albumin: 3.6 g/dL (ref 3.5–5.0)
Alkaline Phosphatase: 51 U/L (ref 38–126)
Bilirubin, Direct: 0.1 mg/dL (ref 0.0–0.2)
Indirect Bilirubin: 0.1 mg/dL — ABNORMAL LOW (ref 0.3–0.9)
Total Bilirubin: 0.2 mg/dL — ABNORMAL LOW (ref 0.3–1.2)
Total Protein: 7 g/dL (ref 6.5–8.1)

## 2019-03-30 LAB — HIV ANTIBODY (ROUTINE TESTING W REFLEX): HIV Screen 4th Generation wRfx: NONREACTIVE

## 2019-03-30 LAB — CBC
HCT: 39.5 % (ref 36.0–46.0)
Hemoglobin: 12.3 g/dL (ref 12.0–15.0)
MCH: 29.1 pg (ref 26.0–34.0)
MCHC: 31.1 g/dL (ref 30.0–36.0)
MCV: 93.4 fL (ref 80.0–100.0)
Platelets: 237 10*3/uL (ref 150–400)
RBC: 4.23 MIL/uL (ref 3.87–5.11)
RDW: 13 % (ref 11.5–15.5)
WBC: 19.7 10*3/uL — ABNORMAL HIGH (ref 4.0–10.5)
nRBC: 0 % (ref 0.0–0.2)

## 2019-03-30 LAB — SARS CORONAVIRUS 2 BY RT PCR (HOSPITAL ORDER, PERFORMED IN ~~LOC~~ HOSPITAL LAB): SARS Coronavirus 2: NEGATIVE

## 2019-03-30 LAB — T4, FREE: Free T4: 0.86 ng/dL (ref 0.61–1.12)

## 2019-03-30 LAB — PHOSPHORUS: Phosphorus: 4.2 mg/dL (ref 2.5–4.6)

## 2019-03-30 MED ORDER — ONDANSETRON HCL 4 MG/2ML IJ SOLN
4.0000 mg | Freq: Four times a day (QID) | INTRAMUSCULAR | Status: DC | PRN
  Administered 2019-03-31: 17:00:00 4 mg via INTRAVENOUS
  Filled 2019-03-30 (×2): qty 2

## 2019-03-30 MED ORDER — SODIUM CHLORIDE 0.9 % IV SOLN
1.0000 g | Freq: Once | INTRAVENOUS | Status: AC
  Administered 2019-03-30: 1 g via INTRAVENOUS
  Filled 2019-03-30: qty 10

## 2019-03-30 MED ORDER — PROMETHAZINE HCL 25 MG/ML IJ SOLN
25.0000 mg | Freq: Four times a day (QID) | INTRAMUSCULAR | Status: DC | PRN
  Administered 2019-03-30: 11:00:00 25 mg via INTRAVENOUS
  Filled 2019-03-30: qty 1

## 2019-03-30 MED ORDER — ACETAMINOPHEN 325 MG PO TABS
650.0000 mg | ORAL_TABLET | Freq: Four times a day (QID) | ORAL | Status: DC | PRN
  Administered 2019-03-30 – 2019-04-02 (×6): 650 mg via ORAL
  Filled 2019-03-30 (×6): qty 2

## 2019-03-30 MED ORDER — HYDRALAZINE HCL 20 MG/ML IJ SOLN
5.0000 mg | INTRAMUSCULAR | Status: DC | PRN
  Administered 2019-03-31 (×3): 5 mg via INTRAVENOUS
  Filled 2019-03-30 (×3): qty 1

## 2019-03-30 MED ORDER — NICOTINE 21 MG/24HR TD PT24
21.0000 mg | MEDICATED_PATCH | Freq: Every day | TRANSDERMAL | Status: DC
  Administered 2019-03-30 – 2019-04-03 (×5): 21 mg via TRANSDERMAL
  Filled 2019-03-30 (×5): qty 1

## 2019-03-30 MED ORDER — ONDANSETRON 4 MG PO TBDP
4.0000 mg | ORAL_TABLET | Freq: Once | ORAL | Status: AC
  Administered 2019-03-30: 4 mg via ORAL
  Filled 2019-03-30: qty 1

## 2019-03-30 MED ORDER — SENNOSIDES-DOCUSATE SODIUM 8.6-50 MG PO TABS
1.0000 | ORAL_TABLET | Freq: Every evening | ORAL | Status: DC | PRN
  Administered 2019-03-31: 1 via ORAL
  Filled 2019-03-30: qty 1

## 2019-03-30 MED ORDER — SODIUM CHLORIDE 0.9 % IV SOLN
Freq: Once | INTRAVENOUS | Status: AC
  Administered 2019-03-30: 03:00:00 via INTRAVENOUS

## 2019-03-30 MED ORDER — SODIUM CHLORIDE 0.9 % IV SOLN
INTRAVENOUS | Status: DC
  Administered 2019-03-30 – 2019-04-01 (×7): via INTRAVENOUS

## 2019-03-30 MED ORDER — ACETAMINOPHEN 650 MG RE SUPP: 650.0000 mg | Freq: Four times a day (QID) | RECTAL | Status: DC | PRN

## 2019-03-30 MED ORDER — SODIUM CHLORIDE 0.9 % IV BOLUS
2000.0000 mL | Freq: Once | INTRAVENOUS | Status: AC
  Administered 2019-03-30: 2000 mL via INTRAVENOUS

## 2019-03-30 MED ORDER — HEPARIN SODIUM (PORCINE) 5000 UNIT/ML IJ SOLN
5000.0000 [IU] | Freq: Three times a day (TID) | INTRAMUSCULAR | Status: DC
  Administered 2019-03-30 – 2019-04-03 (×13): 5000 [IU] via SUBCUTANEOUS
  Filled 2019-03-30 (×13): qty 1

## 2019-03-30 MED ORDER — ONDANSETRON HCL 4 MG PO TABS: 4.0000 mg | ORAL_TABLET | Freq: Four times a day (QID) | ORAL | Status: DC | PRN
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
## 2019-03-30 NOTE — ED Notes (Signed)
ED TO INPATIENT HANDOFF REPORT  Name/Age/Gender Kayla Richard 28 y.o. female  Code Status Code Status History    Date Active Date Inactive Code Status Order ID Comments User Context   05/02/2015 2233 05/03/2015 1732 Full Code [id number]  Bernell List, RN Inpatient   05/02/2015 1607 05/02/2015 2233 Full Code [id number]  Teena Dunk, RN Inpatient   05/02/2015 0327 05/02/2015 1607 Full Code [id number]  Farrel Gordon, [place] Inpatient   04/14/2015 2109 04/17/2015 1303 Full Code [id number]  Gavin Pound, CNM Inpatient   04/14/2015 1838 04/14/2015 2109 Full Code [id number]  Standard, Venus, CNM Inpatient   Advance Care Planning Activity      Home/SNF/Other Home  Chief Complaint Numbness in Left Side mainly in her ankle  Level of Care/Admitting Diagnosis ED Disposition    ED Disposition Condition [place]: [facility] [[id number]]  Level of Care: Med-Surg [16]  Covid Evaluation: Asymptomatic Screening Protocol (No Symptoms)  Diagnosis: Rhabdomyolysis [728.88.ICD-9-CM]  Admitting Physician: Ivor Costa [[id number]]  Attending Physician: Ivor Costa [id number]  Estimated length of stay: past midnight tomorrow  Certification:: I certify this patient will need inpatient services for at least 2 midnights  PT Class (Do Not Modify): Inpatient [101]  PT Acc Code (Do Not Modify): Private [1]       Medical History Past Medical History:  Diagnosis Date  . Chronic hypertension   . Cyst of breast   . Preterm labor     Allergies Allergies  Allergen Reactions  . Hydrocodone Nausea And Vomiting  . Bee Venom   . Mango Flavor Hives    IV Location/Drains/Wounds Patient Lines/Drains/Airways Status   Active Line/Drains/Airways    Name:   Placement date:   Placement time:   Site:   Days:   Peripheral IV 03/30/19 Left Antecubital   03/30/19    0131    Antecubital   less than 1   Incision (Closed) 04/15/15 Vagina Other (Comment)   04/15/15    1142     1445           Labs/Imaging Results for orders placed or performed during the hospital encounter of 03/29/19 (from the past 48 hour(s))  CBC with Differential     Status: Abnormal   Collection Time: 03/29/19 11:53 PM  Result Value Ref Range   WBC 24.4 (H) 4.0 - 10.5 K/uL   RBC 4.75 3.87 - 5.11 MIL/uL   Hemoglobin 14.5 12.0 - 15.0 g/dL   HCT 43.5 36.0 - 46.0 %   MCV 91.6 80.0 - 100.0 fL   MCH 30.5 26.0 - 34.0 pg   MCHC 33.3 30.0 - 36.0 g/dL   RDW 13.0 11.5 - 15.5 %   Platelets 286 150 - 400 K/uL   nRBC 0.0 0.0 - 0.2 %   Neutrophils Relative % 81 %   Neutro Abs 19.7 (H) 1.7 - 7.7 K/uL   Lymphocytes Relative 10 %   Lymphs Abs 2.4 0.7 - 4.0 K/uL   Monocytes Relative 7 %   Monocytes Absolute 1.7 (H) 0.1 - 1.0 K/uL   Eosinophils Relative 1 %   Eosinophils Absolute 0.3 0.0 - 0.5 K/uL   Basophils Relative 0 %   Basophils Absolute 0.0 0.0 - 0.1 K/uL   Immature Granulocytes 1 %   Abs Immature Granulocytes 0.19 (H) 0.00 - 0.07 K/uL    Comment: Performed at [facility], [place] [address], [place], [place] [address]  Basic metabolic panel  Status: Abnormal   Collection Time: 03/29/19 11:53 PM  Result Value Ref Range   Sodium 139 135 - 145 mmol/L   Potassium 4.3 3.5 - 5.1 mmol/L   Chloride 101 98 - 111 mmol/L   CO2 24 22 - 32 mmol/L   Glucose, Bld 123 (H) 70 - 99 mg/dL   BUN 26 (H) 6 - 20 mg/dL   Creatinine, Ser 1.611.80 (H) 0.44 - 1.00 mg/dL   Calcium 7.9 (L) 8.9 - 10.3 mg/dL   GFR calc non Af Amer 38 (L) >60 mL/min   GFR calc Af Amer 44 (L) >60 mL/min   Anion gap 14 5 - 15    Comment: Performed at [facility], [address] [place], [place] [address]  CK     Status: Abnormal   Collection Time: 03/29/19 11:53 PM  Result Value Ref Range   Total CK >50,000 (H) 38 - 234 U/L    Comment: RESULTS CONFIRMED BY MANUAL DILUTION Performed at [facility], [address] [place], [place] [address]   Urinalysis, Routine w reflex  microscopic     Status: Abnormal   Collection Time: 03/30/19  2:52 AM  Result Value Ref Range   Color, Urine BROWN (A) YELLOW    Comment: BIOCHEMICALS MAY BE AFFECTED BY COLOR BIOCHEMICALS MAY BE AFFECTED BY COLOR    APPearance CLOUDY (A) CLEAR   Specific Gravity, Urine 1.015 1.005 - 1.030   pH 6.0 5.0 - 8.0   Glucose, UA 50 (A) NEGATIVE mg/dL   Hgb urine dipstick LARGE (A) NEGATIVE   Bilirubin Urine NEGATIVE NEGATIVE   Ketones, ur NEGATIVE NEGATIVE mg/dL   Protein, ur 119100 (A) NEGATIVE mg/dL   Nitrite NEGATIVE NEGATIVE   Leukocytes,Ua NEGATIVE NEGATIVE   RBC / HPF 0-5 0 - 5 RBC/hpf   WBC, UA 11-20 0 - 5 WBC/hpf   Bacteria, UA MANY (A) NONE SEEN   Squamous Epithelial / LPF 6-10 0 - 5   Mucus PRESENT     Comment: Performed at [facility], [address] [place], [place] [address]   Dg Foot Complete Left  Result Date: 03/29/2019 CLINICAL DATA:  Fall from bed with decreased sensation in the foot, initial encounter EXAM: LEFT FOOT - COMPLETE 3+ VIEW COMPARISON:  None. FINDINGS: There is no evidence of fracture or dislocation. There is no evidence of arthropathy or other focal bone abnormality. Soft tissues are unremarkable. IMPRESSION: No acute abnormality noted. Electronically Signed   By: Alcide CleverMark  Lukens M.D.   On: 03/29/2019 23:46    Pending Labs Unresulted Labs (From admission, onward)    Start     Ordered   03/30/19 0301  SARS Coronavirus 2 (CEPHEID - Performed in [facility] hospital lab), Hosp Order  (Asymptomatic Patients Labs)  ONCE - STAT,   STAT    Question:  Rule Out  Answer:  Yes   03/30/19 0300   Signed and Held  TSH  Tomorrow morning,   R     Signed and Held   Signed and Held  T4, free  Tomorrow morning,   R     Signed and Held   [address]  T3, free  Tomorrow morning,   R     Signed and Held   Signed and Held  CK  2 times daily,   R     Signed and Held   Signed and Held  HIV antibody (Routine Testing)  Once,   R  Signed and Held    Signed and Held  Basic metabolic panel  Tomorrow morning,   R     Signed and Held   Signed and Held  CBC  Tomorrow morning,   R     Signed and Held   Signed and Held  Hepatic function panel  Tomorrow morning,   R     Signed and Held   Signed and Held  Magnesium  Tomorrow morning,   R     Signed and Held   Signed and Held  Phosphorus  Tomorrow morning,   R     Signed and Held          Vitals/Pain Today's Vitals   03/29/19 2217 03/29/19 2355 03/30/19 0136 03/30/19 0253  BP:  (!) 109/93 (!) 158/87 (!) 178/111  Pulse:  84 87 88  Resp:  18 18 16   Temp:      TempSrc:      SpO2:  93% 93% 99%  PainSc: 10-Worst pain ever       Isolation Precautions No active isolations  Medications Medications  0.9 %  sodium chloride infusion (has no administration in time range)  gabapentin (NEURONTIN) capsule 100 mg (100 mg Oral Given 03/30/19 0009)  predniSONE (DELTASONE) tablet 60 mg (60 mg Oral Given 03/30/19 0009)  ondansetron (ZOFRAN-ODT) disintegrating tablet 4 mg (4 mg Oral Given 03/30/19 0009)  sodium chloride 0.9 % bolus 2,000 mL (0 mLs Intravenous Stopped 03/30/19 0304)  0.9 %  sodium chloride infusion ( Intravenous New Bag/Given 03/30/19 [id number])    Mobility walks with person assist

## 2019-03-30 NOTE — Progress Notes (Addendum)
PROGRESS NOTE    Kayla Richard   IOE:[id number]  DOB: 1990/11/18  DOA: 03/29/2019 PCP: Patient, No Pcp Per   Brief Narrative:   Kayla Richard is a 28 y.o. female with medical history significant of morbid obesity, hypertension,  tobacco abuse, who presents with fall and right foot and leg numbness. She fell out of bed and due to a recent left ankle fracture, was not able to get out of bed for many hours. She lay on her right side and eventually lost sensation in her right foot and lower leg prompting her to come to the ED. She was found to have rhabdomyolysis, AKI and a WBC count of 24.4.   Subjective: Still feels that left foot is numb. She has no other complaints.     Assessment & Plan:   Principal Problem:   Rhabdomyolysis - due to fall and laying on floor for many hours - cont slow IVF  Active Problems: AKI - due to rhabdo? - Cr slightly worse today- cont IVF and follow  Elevated LFTs - also likely due to rhabdo- follow    Morbid obesity with BMI of 40.0-44.9, adult Body mass index is 47.78 kg/m. - needs to lose weight   Numbness left foot/ leg - likely secondary to nerve compression from laying on the floor and possibly nerve irritation from muscle injury- follow-  - MRI L spine ordered and pending - CT head negative  Nicotine abuse -cont nicotine patch  Leukocytosis - possibly stress response- improved today  Right ankle fracture -she has followed up with orthopedics as outpt- using crutches- will have outpt PT once discharged  HTN - not on any medications per med rec- will order PRN Hydralazine and follow  Time spent in minutes: 35 DVT prophylaxis: Heparin Code Status: Full code Family Communication:  Disposition Plan: home when stable Consultants:   none Procedures:   none Antimicrobials:  Anti-infectives (From admission, onward)   Start     Dose/Rate Route Frequency Ordered Stop   03/30/19 0415  cefTRIAXone (ROCEPHIN) 1 g in sodium  chloride 0.9 % 100 mL IVPB     1 g 200 mL/hr over 30 Minutes Intravenous  Once 03/30/19 0405 03/30/19 0452       Objective: Vitals:   03/30/19 0331 03/30/19 0441 03/30/19 0445 03/30/19 0529  BP: (!) 141/95 (!) 159/119  (!) 159/99  Pulse: 91 87  85  Resp: 18 16    Temp:  98.3 F (36.8 C)    TempSrc:      SpO2: 96% 98%    Weight:   107.3 kg   Height:   4\' 11"  (1.499 m)     Intake/Output Summary (Last 24 hours) at 03/30/2019 1317 Last data filed at 03/30/2019 0733 Gross per 24 hour  Intake 3184.13 ml  Output 300 ml  Net 2884.13 ml   Filed Weights   03/30/19 0445  Weight: 107.3 kg    Examination: General exam: Appears comfortable  HEENT: PERRLA, oral mucosa moist, no sclera icterus or thrush Respiratory system: Clear to auscultation. Respiratory effort normal. Cardiovascular system: S1 & S2 heard, RRR.   Gastrointestinal system: Abdomen soft, non-tender, nondistended. Normal bowel sounds. Central nervous system: Alert and oriented. Subjective numbness of her left toes Extremities: No cyanosis, clubbing or edema Skin: No rashes or ulcers Psychiatry:  Mood & affect appropriate.     Data Reviewed: I have personally reviewed following labs and imaging studies  CBC: Recent Labs  Lab 03/29/19 2353 03/30/19 1829  WBC 24.4* 19.7*  NEUTROABS 19.7*  --   HGB 14.5 12.3  HCT 43.5 39.5  MCV 91.6 93.4  PLT 286 237   Basic Metabolic Panel: Recent Labs  Lab 03/29/19 2353 03/30/19 0748  NA 139 140  K 4.3 3.9  CL 101 105  CO2 24 22  GLUCOSE 123* 120*  BUN 26* 30*  CREATININE 1.80* 2.18*  CALCIUM 7.9* 7.1*  MG  --  1.8  PHOS  --  4.2   GFR: Estimated Creatinine Clearance: 41.7 mL/min (A) (by C-G formula based on SCr of 2.18 mg/dL (H)). Liver Function Tests: Recent Labs  Lab 03/30/19 0748  AST 664*  ALT 256*  ALKPHOS 51  BILITOT 0.2*  PROT 7.0  ALBUMIN 3.6   No results for input(s): LIPASE, AMYLASE in the last 168 hours. No results for input(s): AMMONIA  in the last 168 hours. Coagulation Profile: No results for input(s): INR, PROTIME in the last 168 hours. Cardiac Enzymes: Recent Labs  Lab 03/29/19 2353 03/30/19 0748  CKTOTAL >50,000* >50,000*   BNP (last 3 results) No results for input(s): PROBNP in the last 8760 hours. HbA1C: No results for input(s): HGBA1C in the last 72 hours. CBG: No results for input(s): GLUCAP in the last 168 hours. Lipid Profile: No results for input(s): CHOL, HDL, LDLCALC, TRIG, CHOLHDL, LDLDIRECT in the last 72 hours. Thyroid Function Tests: Recent Labs    03/30/19 0748  TSH 1.387  FREET4 0.86   Anemia Panel: No results for input(s): VITAMINB12, FOLATE, FERRITIN, TIBC, IRON, RETICCTPCT in the last 72 hours. Urine analysis:    Component Value Date/Time   COLORURINE BROWN (A) 03/30/2019 0252   APPEARANCEUR CLOUDY (A) 03/30/2019 0252   LABSPEC 1.015 03/30/2019 0252   PHURINE 6.0 03/30/2019 0252   GLUCOSEU 50 (A) 03/30/2019 0252   HGBUR LARGE (A) 03/30/2019 0252   BILIRUBINUR NEGATIVE 03/30/2019 0252   KETONESUR NEGATIVE 03/30/2019 0252   PROTEINUR 100 (A) 03/30/2019 0252   UROBILINOGEN 0.2 11/28/2016 1304   NITRITE NEGATIVE 03/30/2019 0252   LEUKOCYTESUR NEGATIVE 03/30/2019 0252   Sepsis Labs: @LABRCNTIP (procalcitonin:4,lacticidven:4) ) Recent Results (from the past 240 hour(s))  SARS Coronavirus 2 (CEPHEID - Performed in [facility] lab), Hosp Order     Status: None   Collection Time: 03/30/19  3:11 AM   Specimen: Nasopharyngeal Swab  Result Value Ref Range Status   SARS Coronavirus 2 NEGATIVE NEGATIVE Final    Comment: (NOTE) If result is NEGATIVE SARS-CoV-2 target nucleic acids are NOT DETECTED. The SARS-CoV-2 RNA is generally detectable in upper and lower  respiratory specimens during the acute phase of infection. The lowest  concentration of SARS-CoV-2 viral copies this assay can detect is 250  copies / mL. A negative result does not preclude SARS-CoV-2 infection  and  should not be used as the sole basis for treatment or other  patient management decisions.  A negative result may occur with  improper specimen collection / handling, submission of specimen other  than nasopharyngeal swab, presence of viral mutation(s) within the  areas targeted by this assay, and inadequate number of viral copies  (<250 copies / mL). A negative result must be combined with clinical  observations, patient history, and epidemiological information. If result is POSITIVE SARS-CoV-2 target nucleic acids are DETECTED. The SARS-CoV-2 RNA is generally detectable in upper and lower  respiratory specimens dur ing the acute phase of infection.  Positive  results are indicative of active infection with SARS-CoV-2.  Clinical  correlation with patient  history and other diagnostic information is  necessary to determine patient infection status.  Positive results do  not rule out bacterial infection or co-infection with other viruses. If result is PRESUMPTIVE POSTIVE SARS-CoV-2 nucleic acids MAY BE PRESENT.   A presumptive positive result was obtained on the submitted specimen  and confirmed on repeat testing.  While 2019 novel coronavirus  (SARS-CoV-2) nucleic acids may be present in the submitted sample  additional confirmatory testing may be necessary for epidemiological  and / or clinical management purposes  to differentiate between  SARS-CoV-2 and other Sarbecovirus currently known to infect humans.  If clinically indicated additional testing with an alternate test  methodology [phone]([id number]) is advised. The SARS-CoV-2 RNA is generally  detectable in upper and lower respiratory sp ecimens during the acute  phase of infection. The expected result is Negative. Fact Sheet for Patients:  BoilerBrush.com.cyhttps://www.fda.gov/media/136312/download Fact Sheet for Healthcare Providers: https://pope.com/https://www.fda.gov/media/136313/download This test is not yet approved or cleared by the [place] FDA and has been  authorized for detection and/or diagnosis of SARS-CoV-2 by FDA under an Emergency Use Authorization (EUA).  This EUA will remain in effect (meaning this test can be used) for the duration of the COVID-19 declaration under Section 564(b)(1) of the Act, 21 U.S.C. section 360bbb-3(b)(1), unless the authorization is terminated or revoked sooner. Performed at [facility], [address] [place], [place] [id number]          Radiology Studies: Dg Sacrum/coccyx  Result Date: 03/30/2019 CLINICAL DATA:  Acute sacrum/coccyx pain following fall yesterday. Initial encounter. EXAM: SACRUM AND COCCYX - 2+ VIEW COMPARISON:  None. FINDINGS: There is no evidence of fracture or other focal bone lesions. IMPRESSION: Negative. Electronically Signed   By: Harmon PierJeffrey  Hu M.D.   On: 03/30/2019 09:39   Ct Head Wo Contrast  Result Date: 03/30/2019 CLINICAL DATA:  Fall with ataxia EXAM: CT HEAD WITHOUT CONTRAST TECHNIQUE: Contiguous axial images were obtained from the base of the skull through the vertex without intravenous contrast. COMPARISON:  August 18, 2009 FINDINGS: Brain: The ventricles are normal in size and configuration. There is no intracranial mass, hemorrhage, extra-axial fluid collection, or midline shift. The brain parenchyma appears unremarkable. No evident infarct. Vascular: There is no hyperdense vessel. There is no demonstrable vascular calcification. Skull: Bony calvarium appears intact. Sinuses/Orbits: There is slight mucosal thickening in several ethmoid air cells. There is mild mucosal thickening in the posterior left sphenoid sinus. Other visualized paranasal sinuses are clear. Visualized orbits appear symmetric bilaterally. Other: Mastoid air cells are clear. IMPRESSION: Slight paranasal sinus disease.  Study otherwise unremarkable. Electronically Signed   By: Bretta BangWilliam  Woodruff III M.D.   On: 03/30/2019 08:14   Dg Foot Complete Left  Result Date: 03/29/2019 CLINICAL DATA:   Fall from bed with decreased sensation in the foot, initial encounter EXAM: LEFT FOOT - COMPLETE 3+ VIEW COMPARISON:  None. FINDINGS: There is no evidence of fracture or dislocation. There is no evidence of arthropathy or other focal bone abnormality. Soft tissues are unremarkable. IMPRESSION: No acute abnormality noted. Electronically Signed   By: Alcide CleverMark  Lukens M.D.   On: 03/29/2019 23:46      Scheduled Meds: . heparin  5,000 Units Subcutaneous Q8H  . nicotine  21 mg Transdermal Daily   Continuous Infusions: . sodium chloride 200 mL/hr at 03/30/19 0736     LOS: 0 days      Calvert CantorSaima Aspin Palomarez, MD [facility] Pager: [email or web address] Password [email or web address] 03/30/2019, 1:17 PM

## 2019-03-30 NOTE — ED Notes (Signed)
Urine culture sent to the lab. 

## 2019-03-30 NOTE — ED Notes (Signed)
Patient vomited water on floor and states that she is currently feeling nauseous. EDP made aware.

## 2019-03-30 NOTE — H&P (Signed)
History and Physical    Kayla QuinWhitney M Richard WUJ:[id number] DOB: 09/28/1990 DOA: 03/29/2019  Referring MD/NP/PA:   PCP: Patient, No Pcp Per   Patient coming from:  The patient is coming from home.  At baseline, pt is independent for most of ADL.        Chief Complaint: fall, left ankle numbness  HPI: Kayla Richard is a 28 y.o. female with medical history significant of morbid obesity, hypertension, hypothyroidism, tobacco abuse, who presents with fall, left ankle numbness.  Pt states that she had fall caused small right ankle fracture on 5/30, which did not require surgery. She uses crutch to walk. She states that she fell out of the bed last night at about 8:30 PM, and could not get up by herself, laying on the floor for long time. In the early morning, she started feeling numb in around her right ankle and also has decreased sensation in the same area. She does not have leg weakness.  She states that that she is able to her left leg and foot. She has mild pain in buttocks. She strongly denies head or neck injury. No  HA or neck pain.  Patient denies any chest pain, shortness of breath, cough, fever or chills.  Per report, patient has nausea and vomited once in the ED, currently patient does not have nausea vomiting, diarrhea or abdominal pain.  Denies symptoms of UTI.  No facial droop or slurred speech.  ED Course: pt was found to have CK>50,000, AKI with creatinine 1.80, BUN 26, WBC 24.4, negative pregnancy test, pending COVID-19 test, temperature normal, blood pressure 158/87, heart rate 87, oxygen saturation 93 to 95% on room air.  Chest x-ray negative.  Left foot x-rays negative for acute issues.  Patient is admitted to MedSurg bed as inpatient.  Review of Systems:   General: no fevers, chills, no body weight gain, has fatigue HEENT: no blurry vision, hearing changes or sore throat Respiratory: no dyspnea, coughing, wheezing CV: no chest pain, no palpitations GI: no nausea, vomiting,  abdominal pain, diarrhea, constipation GU: no dysuria, burning on urination, increased urinary frequency, hematuria  Ext: no leg edema Neuro: no vision change or hearing loss. Has fall. Has numbness in left lowe leg from upper ankle to foot. The left leg is cold, but she has good pulse. Skin: no rash, no skin tear. MSK: No muscle spasm, no deformity, no limitation of range of movement in spin Heme: No easy bruising.  Travel history: No recent long distant travel.  Allergy:  Allergies  Allergen Reactions  . Hydrocodone Nausea And Vomiting  . Bee Venom   . Mango Flavor Hives    Past Medical History:  Diagnosis Date  . Chronic hypertension   . Cyst of breast   . Preterm labor     Past Surgical History:  Procedure Laterality Date  . CERVICAL CERCLAGE N/A 04/15/2015   Procedure: CERCLAGE CERVICAL;  Surgeon: Jaymes GraffNaima Dillard, MD;  Location: [facility];  Service: Gynecology;  Laterality: N/A;    Social History:  reports that she has been smoking cigarettes. She has a 1.50 pack-year smoking history. She uses smokeless tobacco. She reports that she does not drink alcohol or use drugs.  Family History:  Family History  Problem Relation Age of Onset  . Hypertension Mother   . Diabetes Father      Prior to Admission medications   Medication Sig Start Date End Date Taking? Authorizing Provider  cephALEXin (KEFLEX) 500 MG capsule Take 1 capsule (  500 mg total) by mouth 4 (four) times daily. Patient not taking: Reported on 03/29/2019 02/18/19   Fayrene Helperran, Bowie, PA-C  naproxen (NAPROSYN) 500 MG tablet Take 1 tablet (500 mg total) by mouth 2 (two) times daily. Patient not taking: Reported on 03/29/2019 02/14/19   Linwood DibblesKnapp, Jon, MD    Physical Exam: Vitals:   03/29/19 2215 03/29/19 2355 03/30/19 0136 03/30/19 0253  BP: (!) 150/101 (!) 109/93 (!) 158/87 (!) 178/111  Pulse: 87 84 87 88  Resp: 16 18 18 16   Temp: 98.1 F (36.7 C)     TempSrc: Oral     SpO2: 95% 93% 93% 99%   General: Not in acute  distress HEENT:       Eyes: PERRL, EOMI, no scleral icterus.       ENT: No discharge from the ears and nose, no pharynx injection, no tonsillar enlargement.        Neck: No JVD, no bruit, no mass felt. Heme: No neck lymph node enlargement. Cardiac: S1/S2, RRR, No murmurs, No gallops or rubs. Respiratory: No rales, wheezing, rhonchi or rubs. GI: Soft, nondistended, nontender, no rebound pain, no organomegaly, BS present. GU: No hematuria Ext: No pitting leg edema bilaterally. 2+DP/PT pulse bilaterally. Musculoskeletal: No joint deformities, No joint redness or warmth, no limitation of ROM in spin. Skin: No rashes.  Neuro: Alert, oriented X3, cranial nerves II-XII grossly intact, moves all extremities. Has fall. Has numbness in left lowe leg from upper ankle to foot. The left leg is cold, but she has good pulse.  Psych: Patient is not psychotic, no suicidal or hemocidal ideation.  Labs on Admission: I have personally reviewed following labs and imaging studies  CBC: Recent Labs  Lab 03/29/19 2353  WBC 24.4*  NEUTROABS 19.7*  HGB 14.5  HCT 43.5  MCV 91.6  PLT 286   Basic Metabolic Panel: Recent Labs  Lab 03/29/19 2353  NA 139  K 4.3  CL 101  CO2 24  GLUCOSE 123*  BUN 26*  CREATININE 1.80*  CALCIUM 7.9*   GFR: CrCl cannot be calculated (Unknown ideal weight.). Liver Function Tests: No results for input(s): AST, ALT, ALKPHOS, BILITOT, PROT, ALBUMIN in the last 168 hours. No results for input(s): LIPASE, AMYLASE in the last 168 hours. No results for input(s): AMMONIA in the last 168 hours. Coagulation Profile: No results for input(s): INR, PROTIME in the last 168 hours. Cardiac Enzymes: Recent Labs  Lab 03/29/19 2353  CKTOTAL >50,000*   BNP (last 3 results) No results for input(s): PROBNP in the last 8760 hours. HbA1C: No results for input(s): HGBA1C in the last 72 hours. CBG: No results for input(s): GLUCAP in the last 168 hours. Lipid Profile: No results  for input(s): CHOL, HDL, LDLCALC, TRIG, CHOLHDL, LDLDIRECT in the last 72 hours. Thyroid Function Tests: No results for input(s): TSH, T4TOTAL, FREET4, T3FREE, THYROIDAB in the last 72 hours. Anemia Panel: No results for input(s): VITAMINB12, FOLATE, FERRITIN, TIBC, IRON, RETICCTPCT in the last 72 hours. Urine analysis:    Component Value Date/Time   COLORURINE BROWN (A) 03/30/2019 0252   APPEARANCEUR CLOUDY (A) 03/30/2019 0252   LABSPEC 1.015 03/30/2019 0252   PHURINE 6.0 03/30/2019 0252   GLUCOSEU 50 (A) 03/30/2019 0252   HGBUR LARGE (A) 03/30/2019 0252   BILIRUBINUR NEGATIVE 03/30/2019 0252   KETONESUR NEGATIVE 03/30/2019 0252   PROTEINUR 100 (A) 03/30/2019 0252   UROBILINOGEN 0.2 11/28/2016 1304   NITRITE NEGATIVE 03/30/2019 0252   LEUKOCYTESUR NEGATIVE 03/30/2019 0252  Sepsis Labs: @LABRCNTIP (procalcitonin:4,lacticidven:4) )No results found for this or any previous visit (from the past 240 hour(s)).   Radiological Exams on Admission: Dg Foot Complete Left  Result Date: 03/29/2019 CLINICAL DATA:  Fall from bed with decreased sensation in the foot, initial encounter EXAM: LEFT FOOT - COMPLETE 3+ VIEW COMPARISON:  None. FINDINGS: There is no evidence of fracture or dislocation. There is no evidence of arthropathy or other focal bone abnormality. Soft tissues are unremarkable. IMPRESSION: No acute abnormality noted. Electronically Signed   By: Alcide CleverMark  Lukens M.D.   On: 03/29/2019 23:46     EKG: Not done in ED, will get one.   Assessment/Plan Principal Problem:   Rhabdomyolysis Active Problems:   Morbid obesity with BMI of 40.0-44.9, adult (HCC)   Hyperthyroidism   AKI (acute kidney injury) (HCC)   Tobacco abuse   Fall   Paresthesia   HTN (hypertension)   Leukocytosis   Rhabdomyolysis:  His CK>50000. Has AKI. - will admit to med-Surg bed as inpt - IVF: received 2 L of NS in ED, will continue at 200 cc/h.  - will trend CK q12h  - check phosphrus level and Mag level   - check LFT - strict I/o for monitoring urine output  - avoid NSAIDs  Paresthesia in left ankle: likely due to rhabdomyolysis, but need to rule out lumbar spinal cord injury -will get MRI-L spin  Morbid obesity with BMI of 40.0-44.9, adult (HCC) -Diet and exercise.   -Encouraged to lose weight.   Hypertension: Bp 158/87. Not taking meds at home -IV hydralazine as needed -If blood pressure is persistently elevated, may need to start oral medications.  Hx of Hyperthyroidism: not taking meds currently.  TSH 0.151, normal free T4 1.07, elevated free T3 5.6 on 11/28/2016 -Check TSH, free T4, free T3  Tobacco abuse: -Did counseling about importance of quitting smoking -Nicotine patch  AKI (acute kidney injury) (HCC): Most likely due to rhabdomyolysis -- avoid NSAIDs -IVF as above -f/u by CBC -f/u UA which is ordered by EDP  Fall: has pain in buttock -f/u CT-head and X-ray of sacrum/coccyx -Pt/ot  Leukocytosis: no signs of infection. Likely due to stress induced to demargination -follow up by CBC    Inpatient status:  # Patient requires inpatient status due to high intensity of service, high risk for further deterioration and high frequency of surveillance required.  I certify that at the point of admission it is my clinical judgment that the patient will require inpatient hospital care spanning beyond 2 midnights from the point of admission.  . This patient has multiple chronic comorbidities including morbid obesity, hypertension, hypothyroidism, tobacco abuse . Now patient has presenting with fall, numbness in left ankle, rhabdomyolysis . The worrisome physical exam findings include decreasing sensory in the left ankle and foot . The initial radiographic and laboratory data are worrisome because of CK>50,000, AKI and leukocytosis . Current medical needs: please see my assessment and plan . Predictability of an adverse outcome (risk): Patient has multiple comorbidities, now  presents with fall and severe rhabdomyolysis with CK>50,000. Also has AKI. She has left ankle numbness which needs to have further work up.  Her presentation is highly complicated and severe, will need to be treated in hospital for at least 2 days.     DVT ppx: SQ Heparin     Code Status: Full code Family Communication: None at bed side.      Disposition Plan:  Anticipate discharge back to previous home environment Consults called:  none Admission status: medical floor/inpt     Date of Service 03/30/2019    Ivor Costa [facility]   If 7PM-7AM, please contact night-coverage [email or web address] Password [facility] 03/30/2019, 3:55 AM

## 2019-03-30 NOTE — ED Notes (Signed)
Attempted to call report. RN will call back. 

## 2019-03-30 NOTE — ED Notes (Signed)
Patient made aware that urine sample is needed.  

## 2019-03-30 NOTE — Evaluation (Signed)
Physical Therapy Evaluation Patient Details Name: Kayla Richard MRN: [id number] DOB: 09/04/1991 Today's Date: 03/30/2019   History of Present Illness  28 yo female admitted to ED 7/12 with LLE, specifically foot and ankle, numbness after fall from bed where pt stayed on the ground for unknown amount of time. Pt with rhabdomyolysis with resultant AKI. Imaging of L ankle/foot negative for acute findings, R ankle with small fracture fragments arising from distal anterior tibia (originally fractured R ankle on 5/30, has been on crutches since). CT of sacrum and coccyx negative for acute fracture or findings. PMH includes morbid obesity, HTN, hypothyroidism, tobacco abuse.  Clinical Impression   Pt presents with L foot and ankle numbness, severe LLE and low back pain, difficulty performing mobility tasks, unsteadiness in standing, and decreased activity tolerance due to fatigue and pain. Pt to benefit from acute PT to address deficits. Pt ambulated 15 ft with RW requiring min assist for steadying, verbal cuing for safe use of RW. Pt brought her crutches from home that she has been using since 5/30, but they are much too tall for her and unable to be adjusted low enough for pt. PT recommending HHPT with 24/7 assist, per pt her boyfriend is unable to be with her all the time so she may d/c home with mother who has 2 steps to enter her home. PT to progress mobility as tolerated, and will continue to follow acutely.      Follow Up Recommendations Home health PT;Supervision/Assistance - 24 hour    Equipment Recommendations  Rolling walker with 5" wheels(youth RW, versus youth axillary crutches; TBD)    Recommendations for Other Services       Precautions / Restrictions Precautions Precautions: Fall Restrictions Weight Bearing Restrictions: No Other Position/Activity Restrictions: Pt reports she does not have WB precautions of RLE, originally fractured ankle on 5/30 and has been using axillary  crutches since      Mobility  Bed Mobility Overal bed mobility: Needs Assistance Bed Mobility: Supine to Sit     Supine to sit: HOB elevated;+2 for physical assistance;+2 for safety/equipment;Min assist     General bed mobility comments: Min assist for LLE lifting and translation to EOB, scooting to EOB with use of bed pad. Increased time and effort to perform, with use of bedrails to assist.  Transfers Overall transfer level: Needs assistance Equipment used: Crutches Transfers: Sit to/from Stand Sit to Stand: From elevated surface;Min assist;+2 safety/equipment         General transfer comment: Min assist for power up, steadying, placement of axillary crutches once standing. VC for holding handbar on crutches to stand vs onto axillary pad. Pt took one step forward with crutches, very unsteady and the crutches were much too tall for her. Transitioned to yotuh RW.  Ambulation/Gait Ambulation/Gait assistance: Min assist;+2 safety/equipment;+2 physical assistance Gait Distance (Feet): 15 Feet Assistive device: Rolling walker (2 wheeled) Gait Pattern/deviations: Step-to pattern;Decreased step length - right;Decreased step length - left;Trunk flexed;Decreased weight shift to left Gait velocity: decr   General Gait Details: Min assist for steadying, verbal cuing for sequencing and placement in RW. Pt fatigued quickly, states "I can't feel my left foot at all" when ambulating.  Stairs            Wheelchair Mobility    Modified Rankin (Stroke Patients Only)       Balance Overall balance assessment: Needs assistance;History of Falls Sitting-balance support: No upper extremity supported;Feet supported Sitting balance-Leahy Scale: Good     Standing  balance support: Bilateral upper extremity supported Standing balance-Leahy Scale: Poor                               Pertinent Vitals/Pain Pain Assessment: 0-10 Pain Score: 10-Worst pain ever Pain Location:  LLE, lower back Pain Descriptors / Indicators: Sore;Discomfort;Grimacing Pain Intervention(s): Limited activity within patient's tolerance;Monitored during session;Premedicated before session;Repositioned    Home Living Family/patient expects to be discharged to:: Private residence Living Arrangements: Spouse/significant other Available Help at Discharge: Family;Available 24 hours/day(pt to stay with mother if needs 24/7 assist, otherwise d/c to apt information below) Type of Home: Apartment Home Access: Stairs to enter Entrance Stairs-Rails: Right;Left Entrance Stairs-Number of Steps: 14 Home Layout: One level Home Equipment: Crutches      Prior Function Level of Independence: Needs assistance   Gait / Transfers Assistance Needed: pt has been using crutches since may due to R ankle fracture  ADL's / Homemaking Assistance Needed: pt reports getting assist from boyfriend for dressing, bathing, cooking, and cleaning        Hand Dominance   Dominant Hand: Right    Extremity/Trunk Assessment   Upper Extremity Assessment Upper Extremity Assessment: Overall WFL for tasks assessed    Lower Extremity Assessment Lower Extremity Assessment: LLE deficits/detail;Generalized weakness LLE Deficits / Details: unable to actively PF/DF, able to move with PROM without increased pain. 2/5 knee flexion and extension LLE Sensation: decreased light touch(especially of foot and ankle)    Cervical / Trunk Assessment Cervical / Trunk Assessment: Normal  Communication   Communication: No difficulties  Cognition Arousal/Alertness: Lethargic;Suspect due to medications Behavior During Therapy: [facility] for tasks assessed/performed Overall Cognitive Status: Within Functional Limits for tasks assessed                                        General Comments      Exercises General Exercises - Lower Extremity Ankle Circles/Pumps: PROM;Left;10 reps;Supine   Assessment/Plan    PT  Assessment Patient needs continued PT services  PT Problem List Decreased strength;Decreased mobility;Decreased safety awareness;Decreased range of motion;Decreased activity tolerance;Decreased balance;Decreased knowledge of use of DME;Pain       PT Treatment Interventions DME instruction;Therapeutic activities;Gait training;Therapeutic exercise;Patient/family education;Balance training;Stair training;Functional mobility training    PT Goals (Current goals can be found in the Care Plan section)  Acute Rehab PT Goals Patient Stated Goal: go home PT Goal Formulation: With patient Time For Goal Achievement: 04/13/19 Potential to Achieve Goals: Good    Frequency Min 3X/week   Barriers to discharge        Co-evaluation               AM-PAC PT "6 Clicks" Mobility  Outcome Measure Help needed turning from your back to your side while in a flat bed without using bedrails?: A Little Help needed moving from lying on your back to sitting on the side of a flat bed without using bedrails?: A Little Help needed moving to and from a bed to a chair (including a wheelchair)?: A Little Help needed standing up from a chair using your arms (e.g., wheelchair or bedside chair)?: A Little Help needed to walk in hospital room?: A Little Help needed climbing 3-5 steps with a railing? : Total 6 Click Score: 16    End of Session Equipment Utilized During Treatment: Gait belt Activity Tolerance:  Patient limited by pain;Patient limited by fatigue Patient left: in chair;with call bell/phone within reach;with chair alarm set Nurse Communication: Mobility status PT Visit Diagnosis: Other abnormalities of gait and mobility (R26.89);Difficulty in walking, not elsewhere classified (R26.2)    Time: 1207-1223 PT Time Calculation (min) (ACUTE ONLY): 16 min   Charges:   PT Evaluation $PT Eval Low Complexity: 1 Low         Nicola PoliceAlexa D Milanie Rosenfield, PT Acute Rehabilitation Services Pager [phone]  Office  [phone]   Brees Hounshell D Despina Hiddenure 03/30/2019, 1:29 PM

## 2019-03-30 NOTE — Plan of Care (Signed)

## 2019-03-30 NOTE — ED Notes (Signed)
Pt ambulated to the restroom with staff assist.

## 2019-03-31 ENCOUNTER — Inpatient Hospital Stay (HOSPITAL_COMMUNITY): Payer: Self-pay

## 2019-03-31 LAB — COMPREHENSIVE METABOLIC PANEL
ALT: 271 U/L — ABNORMAL HIGH (ref 0–44)
AST: 506 U/L — ABNORMAL HIGH (ref 15–41)
Albumin: 3.6 g/dL (ref 3.5–5.0)
Alkaline Phosphatase: 48 U/L (ref 38–126)
Anion gap: 9 (ref 5–15)
BUN: 34 mg/dL — ABNORMAL HIGH (ref 6–20)
CO2: 22 mmol/L (ref 22–32)
Calcium: 8.1 mg/dL — ABNORMAL LOW (ref 8.9–10.3)
Chloride: 109 mmol/L (ref 98–111)
Creatinine, Ser: 3.45 mg/dL — ABNORMAL HIGH (ref 0.44–1.00)
GFR calc Af Amer: 20 mL/min — ABNORMAL LOW (ref >60)
GFR calc non Af Amer: 17 mL/min — ABNORMAL LOW (ref >60)
Glucose, Bld: 109 mg/dL — ABNORMAL HIGH (ref 70–99)
Potassium: 3.9 mmol/L (ref 3.5–5.1)
Sodium: 140 mmol/L (ref 135–145)
Total Bilirubin: 0.3 mg/dL (ref 0.3–1.2)
Total Protein: 7 g/dL (ref 6.5–8.1)

## 2019-03-31 LAB — CBC
HCT: 38 % (ref 36.0–46.0)
Hemoglobin: 11.7 g/dL — ABNORMAL LOW (ref 12.0–15.0)
MCH: 28.7 pg (ref 26.0–34.0)
MCHC: 30.8 g/dL (ref 30.0–36.0)
MCV: 93.4 fL (ref 80.0–100.0)
Platelets: 212 10*3/uL (ref 150–400)
RBC: 4.07 MIL/uL (ref 3.87–5.11)
RDW: 13.2 % (ref 11.5–15.5)
WBC: 17 10*3/uL — ABNORMAL HIGH (ref 4.0–10.5)
nRBC: 0 % (ref 0.0–0.2)

## 2019-03-31 LAB — CK
Total CK: 41107 U/L — ABNORMAL HIGH (ref 38–234)
Total CK: 34682 U/L — ABNORMAL HIGH (ref 38–234)

## 2019-03-31 LAB — T3, FREE: T3, Free: 3 pg/mL (ref 2.0–4.4)

## 2019-03-31 MED ORDER — LABETALOL HCL 5 MG/ML IV SOLN
20.0000 mg | INTRAVENOUS | Status: DC | PRN
  Administered 2019-04-02: 20 mg via INTRAVENOUS
  Filled 2019-03-31 (×2): qty 4

## 2019-03-31 MED ORDER — SODIUM CHLORIDE 0.45 % IV SOLN
INTRAVENOUS | Status: DC
  Administered 2019-03-31 – 2019-04-03 (×5): via INTRAVENOUS

## 2019-03-31 MED ORDER — AMLODIPINE BESYLATE 10 MG PO TABS
10.0000 mg | ORAL_TABLET | Freq: Every day | ORAL | Status: DC
  Administered 2019-04-01 – 2019-04-03 (×3): 10 mg via ORAL
  Filled 2019-03-31 (×3): qty 1

## 2019-03-31 MED ORDER — LORAZEPAM 2 MG/ML IJ SOLN
1.0000 mg | Freq: Once | INTRAMUSCULAR | Status: AC
  Administered 2019-03-31: 1 mg via INTRAVENOUS
  Filled 2019-03-31: qty 1

## 2019-03-31 MED ORDER — LABETALOL HCL 5 MG/ML IV SOLN
10.0000 mg | INTRAVENOUS | Status: DC | PRN
  Administered 2019-03-31: 10 mg via INTRAVENOUS
  Filled 2019-03-31 (×2): qty 4

## 2019-03-31 MED ORDER — CLONIDINE HCL 0.1 MG PO TABS
0.2000 mg | ORAL_TABLET | ORAL | Status: AC
  Administered 2019-03-31: 0.2 mg via ORAL
  Filled 2019-03-31: qty 2

## 2019-03-31 MED ORDER — AMLODIPINE BESYLATE 5 MG PO TABS
5.0000 mg | ORAL_TABLET | Freq: Every day | ORAL | Status: DC
  Administered 2019-03-31: 5 mg via ORAL
  Filled 2019-03-31: qty 1

## 2019-03-31 MED ORDER — HYDRALAZINE HCL 20 MG/ML IJ SOLN
10.0000 mg | INTRAMUSCULAR | Status: DC | PRN
  Administered 2019-04-01 – 2019-04-02 (×4): 10 mg via INTRAVENOUS
  Filled 2019-03-31 (×4): qty 1

## 2019-03-31 NOTE — Progress Notes (Signed)
PROGRESS NOTE    Kayla Richard   ION:[id number]  DOB: 02-07-1991  DOA: 03/29/2019 PCP: Patient, No Pcp Per   Brief Narrative:   Kayla Richard is a 28 y.o. female with medical history significant of morbid obesity, hypertension,  tobacco abuse, who presents with fall and right foot and leg numbness. She fell out of bed and due to a recent left ankle fracture, was not able to get out of bed for many hours. She lay on her right side and eventually lost sensation in her right foot and lower leg prompting her to come to the ED. She was found to have rhabdomyolysis, AKI and a WBC count of 24.4.   Subjective: She continues to feel numbness in left foot. She has no other complaints.     Assessment & Plan:   Principal Problem:   Rhabdomyolysis - due to fall and laying on floor for many hours - cont slow IVF- CPK is improving  Active Problems: AKI  - Cr continues to worsen today - continue to hydrate - she may have ATN due to rhabdomyolysis - will check renal ultrasound to be sure no obstruction-  2.5 L urine out yesterday- hold off on Nephro consult for now  Elevated LFTs - also likely due to rhabdo- improving   Numbness left foot/ leg - likely secondary to nerve compression from laying on the floor and possibly nerve irritation from muscle injury- follow-  - MRI L spine ordered and pending - CT head negative    Morbid obesity with BMI of 40.0-44.9, adult Body mass index is 47.78 kg/m. - needs to lose weight  Nicotine abuse -cont nicotine patch  Leukocytosis - possibly stress response- improved   Right ankle fracture -she has followed up with orthopedics as outpt and is using crutches- will have outpt PT once discharged as it was ordered by ortho already - I noted that she does not have a brace- she tells me she was given a boot to wear on the right foot and I have recommended that someone bring it from home for her so that she can ambulate with PT in the hospital  HTN -  not on any medications per med rec- - add Norvasc today- continue PRN Hydralazine as well  Time spent in minutes: 35 DVT prophylaxis: Heparin Code Status: Full code Family Communication:  Disposition Plan: home when Cr and CPK improved Consultants:   none Procedures:   none Antimicrobials:  Anti-infectives (From admission, onward)   Start     Dose/Rate Route Frequency Ordered Stop   03/30/19 0415  cefTRIAXone (ROCEPHIN) 1 g in sodium chloride 0.9 % 100 mL IVPB     1 g 200 mL/hr over 30 Minutes Intravenous  Once 03/30/19 0405 03/30/19 0452       Objective: Vitals:   03/30/19 1422 03/30/19 1940 03/30/19 2210 03/31/19 0554  BP:   (!) 165/110 (!) 189/110  Pulse: 85 88 95 84  Resp: 17 18 19 17   Temp: 98 F (36.7 C)  99.2 F (37.3 C) 98.3 F (36.8 C)  TempSrc:   Oral Oral  SpO2: 94%  100%   Weight:      Height:        Intake/Output Summary (Last 24 hours) at 03/31/2019 1007 Last data filed at 03/31/2019 0603 Gross per 24 hour  Intake 3882.67 ml  Output 2200 ml  Net 1682.67 ml   Filed Weights   03/30/19 0445  Weight: 107.3 kg    Examination:  General exam: Appears comfortable  HEENT: PERRLA, oral mucosa moist, no sclera icterus or thrush Respiratory system: Clear to auscultation. Respiratory effort normal. Cardiovascular system: S1 & S2 heard,  No murmurs  Gastrointestinal system: Abdomen soft, non-tender, nondistended. Normal bowel sounds   Central nervous system: Alert and oriented. Left forefoot is numb to touch- no other focal neurological deficits. Extremities: No cyanosis, clubbing or edema Skin: No rashes or ulcers Psychiatry:  Mood & affect appropriate.    Data Reviewed: I have personally reviewed following labs and imaging studies  CBC: Recent Labs  Lab 03/29/19 2353 03/30/19 0748 03/31/19 0732  WBC 24.4* 19.7* 17.0*  NEUTROABS 19.7*  --   --   HGB 14.5 12.3 11.7*  HCT 43.5 39.5 38.0  MCV 91.6 93.4 93.4  PLT 286 237 212   Basic Metabolic  Panel: Recent Labs  Lab 03/29/19 2353 03/30/19 0748 03/31/19 0732  NA 139 140 140  K 4.3 3.9 3.9  CL 101 105 109  CO2 24 22 22   GLUCOSE 123* 120* 109*  BUN 26* 30* 34*  CREATININE 1.80* 2.18* 3.45*  CALCIUM 7.9* 7.1* 8.1*  MG  --  1.8  --   PHOS  --  4.2  --    GFR: Estimated Creatinine Clearance: 26.4 mL/min (A) (by C-G formula based on SCr of 3.45 mg/dL (H)). Liver Function Tests: Recent Labs  Lab 03/30/19 0748 03/31/19 0732  AST 664* 506*  ALT 256* 271*  ALKPHOS 51 48  BILITOT 0.2* 0.3  PROT 7.0 7.0  ALBUMIN 3.6 3.6   No results for input(s): LIPASE, AMYLASE in the last 168 hours. No results for input(s): AMMONIA in the last 168 hours. Coagulation Profile: No results for input(s): INR, PROTIME in the last 168 hours. Cardiac Enzymes: Recent Labs  Lab 03/29/19 2353 03/30/19 0748 03/30/19 1744 03/31/19 0732  CKTOTAL >50,000* >50,000* 48,417* 41,107*   BNP (last 3 results) No results for input(s): PROBNP in the last 8760 hours. HbA1C: No results for input(s): HGBA1C in the last 72 hours. CBG: No results for input(s): GLUCAP in the last 168 hours. Lipid Profile: No results for input(s): CHOL, HDL, LDLCALC, TRIG, CHOLHDL, LDLDIRECT in the last 72 hours. Thyroid Function Tests: Recent Labs    03/30/19 0748  TSH 1.387  FREET4 0.86  T3FREE 3.0   Anemia Panel: No results for input(s): VITAMINB12, FOLATE, FERRITIN, TIBC, IRON, RETICCTPCT in the last 72 hours. Urine analysis:    Component Value Date/Time   COLORURINE BROWN (A) 03/30/2019 0252   APPEARANCEUR CLOUDY (A) 03/30/2019 0252   LABSPEC 1.015 03/30/2019 0252   PHURINE 6.0 03/30/2019 0252   GLUCOSEU 50 (A) 03/30/2019 0252   HGBUR LARGE (A) 03/30/2019 0252   BILIRUBINUR NEGATIVE 03/30/2019 0252   KETONESUR NEGATIVE 03/30/2019 0252   PROTEINUR 100 (A) 03/30/2019 0252   UROBILINOGEN 0.2 11/28/2016 1304   NITRITE NEGATIVE 03/30/2019 0252   LEUKOCYTESUR NEGATIVE 03/30/2019 0252   Sepsis Labs:  @LABRCNTIP (procalcitonin:4,lacticidven:4) ) Recent Results (from the past 240 hour(s))  SARS Coronavirus 2 (CEPHEID - [facility] lab), Hosp Order     Status: None   Collection Time: 03/30/19  3:11 AM   Specimen: Nasopharyngeal Swab  Result Value Ref Range Status   SARS Coronavirus 2 NEGATIVE NEGATIVE Final    Comment: (NOTE) If result is NEGATIVE SARS-CoV-2 target nucleic acids are NOT DETECTED. The SARS-CoV-2 RNA is generally detectable in upper and lower  respiratory specimens during the acute phase of infection. The lowest  concentration  of SARS-CoV-2 viral copies this assay can detect is 250  copies / mL. A negative result does not preclude SARS-CoV-2 infection  and should not be used as the sole basis for treatment or other  patient management decisions.  A negative result may occur with  improper specimen collection / handling, submission of specimen other  than nasopharyngeal swab, presence of viral mutation(s) within the  areas targeted by this assay, and inadequate number of viral copies  (<250 copies / mL). A negative result must be combined with clinical  observations, patient history, and epidemiological information. If result is POSITIVE SARS-CoV-2 target nucleic acids are DETECTED. The SARS-CoV-2 RNA is generally detectable in upper and lower  respiratory specimens dur ing the acute phase of infection.  Positive  results are indicative of active infection with SARS-CoV-2.  Clinical  correlation with patient history and other diagnostic information is  necessary to determine patient infection status.  Positive results do  not rule out bacterial infection or co-infection with other viruses. If result is PRESUMPTIVE POSTIVE SARS-CoV-2 nucleic acids MAY BE PRESENT.   A presumptive positive result was obtained on the submitted specimen  and confirmed on repeat testing.  While 2019 novel coronavirus  (SARS-CoV-2) nucleic acids may be present in the  submitted sample  additional confirmatory testing may be necessary for epidemiological  and / or clinical management purposes  to differentiate between  SARS-CoV-2 and other Sarbecovirus currently known to infect humans.  If clinically indicated additional testing with an alternate test  methodology [phone] is advised. The SARS-CoV-2 RNA is generally  detectable in upper and lower respiratory sp ecimens during the acute  phase of infection. The expected result is Negative. Fact Sheet for Patients:  BoilerBrush.com.cyhttps://www.fda.gov/media/136312/download Fact Sheet for Healthcare Providers: https://pope.com/https://www.fda.gov/media/136313/download This test is not yet approved or cleared by the [place] FDA and has been authorized for detection and/or diagnosis of SARS-CoV-2 by FDA under an Emergency Use Authorization (EUA).  This EUA will remain in effect (meaning this test can be used) for the duration of the COVID-19 declaration under Section 564(b)(1) of the Act, 21 U.S.C. section 360bbb-3(b)(1), unless the authorization is terminated or revoked sooner. Performed at [facility], [address] [place], [place] [address]          Radiology Studies: Dg Sacrum/coccyx  Result Date: 03/30/2019 CLINICAL DATA:  Acute sacrum/coccyx pain following fall yesterday. Initial encounter. EXAM: SACRUM AND COCCYX - 2+ VIEW COMPARISON:  None. FINDINGS: There is no evidence of fracture or other focal bone lesions. IMPRESSION: Negative. Electronically Signed   By: Harmon PierJeffrey  Hu M.D.   On: 03/30/2019 09:39   Ct Head Wo Contrast  Result Date: 03/30/2019 CLINICAL DATA:  Fall with ataxia EXAM: CT HEAD WITHOUT CONTRAST TECHNIQUE: Contiguous axial images were obtained from the base of the skull through the vertex without intravenous contrast. COMPARISON:  August 18, 2009 FINDINGS: Brain: The ventricles are normal in size and configuration. There is no intracranial mass, hemorrhage, extra-axial fluid  collection, or midline shift. The brain parenchyma appears unremarkable. No evident infarct. Vascular: There is no hyperdense vessel. There is no demonstrable vascular calcification. Skull: Bony calvarium appears intact. Sinuses/Orbits: There is slight mucosal thickening in several ethmoid air cells. There is mild mucosal thickening in the posterior left sphenoid sinus. Other visualized paranasal sinuses are clear. Visualized orbits appear symmetric bilaterally. Other: Mastoid air cells are clear. IMPRESSION: Slight paranasal sinus disease.  Study otherwise unremarkable. Electronically Signed   By: Bretta BangWilliam  Woodruff III  M.D.   On: 03/30/2019 08:14   Dg Foot Complete Left  Result Date: 03/29/2019 CLINICAL DATA:  Fall from bed with decreased sensation in the foot, initial encounter EXAM: LEFT FOOT - COMPLETE 3+ VIEW COMPARISON:  None. FINDINGS: There is no evidence of fracture or dislocation. There is no evidence of arthropathy or other focal bone abnormality. Soft tissues are unremarkable. IMPRESSION: No acute abnormality noted. Electronically Signed   By: Alcide CleverMark  Lukens M.D.   On: 03/29/2019 23:46      Scheduled Meds: . heparin  5,000 Units Subcutaneous Q8H  . LORazepam  1 mg Intravenous Once  . nicotine  21 mg Transdermal Daily   Continuous Infusions: . sodium chloride 150 mL/hr at 03/31/19 0603     LOS: 1 day      Calvert CantorSaima Bobby Ragan, MD [facility] Pager: [email or web address] Password [email or web address] 03/31/2019, 10:07 AM

## 2019-03-31 NOTE — Plan of Care (Signed)
Plan of care reviewed and discussed with the patient. 

## 2019-03-31 NOTE — Plan of Care (Signed)

## 2019-03-31 NOTE — Evaluation (Signed)
Occupational Therapy Evaluation Patient Details Name: Kayla Richard MRN: [id number] DOB: 03/29/1991 Today's Date: 03/31/2019    History of Present Illness 28 yo female admitted to ED 7/12 with LLE, specifically foot and ankle, numbness after fall from bed where pt stayed on the ground for unknown amount of time. Pt with rhabdomyolysis with resultant AKI. Imaging of L ankle/foot negative for acute findings, R ankle with small fracture fragments arising from distal anterior tibia (originally fractured R ankle on 5/30, has been on crutches since). CT of sacrum and coccyx negative for acute fracture or findings. PMH includes morbid obesity, HTN, hypothyroidism, tobacco abuse.   Clinical Impression   This 28 yo female admitted with above presents to acute OT with decreased AROM of LLE, decreased strength of LLE, increased pain LLE--all affecting her safety and independence with basic ADLs. She will benefit from acute OT without need for follow up.    Follow Up Recommendations  No OT follow up;Supervision - Intermittent    Equipment Recommendations  Other (comment)(bariatric 3n1)       Precautions / Restrictions Precautions Precautions: Fall Restrictions Weight Bearing Restrictions: No Other Position/Activity Restrictions: Pt reports she does not have WB precautions of RLE, originally fractured ankle on 5/30 and has been using axillary crutches since      Mobility Bed Mobility Overal bed mobility: Modified Independent             General bed mobility comments: increased time and use of rail  Transfers   Equipment used: Rolling walker (2 wheeled) Transfers: Sit to/from Stand Sit to Stand: Min guard              Balance Overall balance assessment: Needs assistance Sitting-balance support: No upper extremity supported;Feet supported Sitting balance-Leahy Scale: Good     Standing balance support: Single extremity supported;During functional activity Standing  balance-Leahy Scale: Poor Standing balance comment: standing for peri care                           ADL either performed or assessed with clinical judgement   ADL Overall ADL's : Needs assistance/impaired Eating/Feeding: Independent;Sitting   Grooming: Set up;Sitting   Upper Body Bathing: Set up;Sitting   Lower Body Bathing: Minimal assistance Lower Body Bathing Details (indicate cue type and reason): min guard A sit<>stand Upper Body Dressing : Set up;Sitting   Lower Body Dressing: Moderate assistance Lower Body Dressing Details (indicate cue type and reason): min guard A sit<>stand Toilet Transfer: Minimal assistance;Ambulation;RW;BSC Toilet Transfer Details (indicate cue type and reason): hopping--does not want to put weight on LLE Toileting- Clothing Manipulation and Hygiene: Min guard;Sit to/from stand               Vision Patient Visual Report: No change from baseline              Pertinent Vitals/Pain Pain Assessment: Faces Faces Pain Scale: Hurts even more Pain Location: LLE Pain Descriptors / Indicators: Aching;Grimacing;Guarding;Sore Pain Intervention(s): Limited activity within patient's tolerance;Monitored during session;Repositioned     Hand Dominance Right   Extremity/Trunk Assessment Upper Extremity Assessment Upper Extremity Assessment: Overall WFL for tasks assessed           Communication Communication Communication: No difficulties   Cognition Arousal/Alertness: Awake/alert Behavior During Therapy: WFL for tasks assessed/performed Overall Cognitive Status: Within Functional Limits for tasks assessed  Home Living Family/patient expects to be discharged to:: Private residence Living Arrangements: Spouse/significant other;Parent Available Help at Discharge: Family;Available PRN/intermittently Type of Home: Apartment Home Access: Stairs to enter [employer] of Steps: 2 at mothers house, 41 at pt's apartment Entrance Stairs-Rails: Right;Left(her apartment) Home Layout: One level     Bathroom Shower/Tub: Occupational psychologist: Standard     Home Equipment: Crutches          Prior Functioning/Environment Level of Independence: Needs assistance  Gait / Transfers Assistance Needed: pt has been using crutches since may due to R ankle fracture ADL's / Homemaking Assistance Needed: pt reports getting assist from boyfriend for dressing, bathing, cooking, and cleaning            OT Problem List: Decreased strength;Decreased range of motion;Impaired balance (sitting and/or standing);Obesity;Pain;Decreased knowledge of use of DME or AE      OT Treatment/Interventions: Self-care/ADL training;DME and/or AE instruction;Patient/family education;Balance training    OT Goals(Current goals can be found in the care plan section) Acute Rehab OT Goals Patient Stated Goal: to figure out why my left leg is numb and painful then go home OT Goal Formulation: With patient Time For Goal Achievement: 04/14/19 Potential to Achieve Goals: Good  OT Frequency: Min 2X/week              AM-PAC OT "6 Clicks" Daily Activity     Outcome Measure Help from another person eating meals?: None Help from another person taking care of personal grooming?: A Little Help from another person toileting, which includes using toliet, bedpan, or urinal?: A Little Help from another person bathing (including washing, rinsing, drying)?: A Little Help from another person to put on and taking off regular upper body clothing?: A Little Help from another person to put on and taking off regular lower body clothing?: A Lot 6 Click Score: 18   End of Session Equipment Utilized During Treatment: Gait belt;Rolling walker Nurse Communication: (pt's IV leaking and then bleeding)  Activity Tolerance: Patient tolerated treatment well Patient left: in  chair;with call bell/phone within reach;with chair alarm set  OT Visit Diagnosis: Unsteadiness on feet (R26.81);Other abnormalities of gait and mobility (R26.89);Muscle weakness (generalized) (M62.81);Pain Pain - Right/Left: Left Pain - part of body: Leg                Time: 7824-2353 OT Time Calculation (min): 21 min Charges:  OT General Charges $OT Visit: 1 Visit OT Evaluation $OT Eval Moderate Complexity: 1 Mod  [address] [address], OTR/L Acute [employer] Pager [phone] Office [phone]     Almon Register 03/31/2019, 9:41 AM

## 2019-03-31 NOTE — TOC Initial Note (Addendum)
Transition of Care [facility]) - Initial/Assessment Note    Patient Details  Name: Kayla Richard MRN: [id number] Date of Birth: 02-18-1991  Transition of [facility]) CM/SW Contact:    Lia Hopping, [place] Phone Number: 03/31/2019, 3:57 PM  Clinical Narrative:    PT recommends RW and 3 in 1. Patient agreeable to DME. CSW reached out to [place] and made a charity referral. [employer] rep will deliver to patient room.             Per physician note, the patient  has followed up with orthopedics as outpt and is using crutches- will have outpt PT once discharged as it was ordered by ortho already. Patient will not need Home Health.   Patient states she cannot afford her medications will give [place] letter.  Expected Discharge Plan: Home/Self Care Barriers to Discharge: No Barriers Identified   Patient Goals and CMS Choice        Expected Discharge Plan and Services Expected Discharge Plan: Home/Self Care         Expected Discharge Date: 04/01/19               DME Arranged: 3-N-1, Walker rolling DME Agency: [employer] Date DME Agency Contacted: 03/31/19 Time DME Agency Contacted: 34 Representative spoke with at DME Agency: Bethanne Ginger            Prior Living Arrangements/Services              Need for Family Participation in Patient Care: Yes (Comment) Care giver support system in place?: Yes (comment)   Criminal Activity/Legal Involvement Pertinent to Current Situation/Hospitalization: Yes - Comment as needed  Activities of Daily Living Home Assistive Devices/Equipment: Crutches ADL Screening (condition at time of admission) Patient's cognitive ability adequate to safely complete daily activities?: Yes Is the patient deaf or have difficulty hearing?: No Does the patient have difficulty seeing, even when wearing glasses/contacts?: No Does the patient have difficulty concentrating, remembering, or making decisions?: No Patient able to express need for assistance  with ADLs?: Yes Does the patient have difficulty dressing or bathing?: No Independently performs ADLs?: Yes (appropriate for developmental age) Does the patient have difficulty walking or climbing stairs?: Yes Weakness of Legs: Both Weakness of Arms/Hands: None  Permission Sought/Granted   Permission granted to share information with : Yes, Verbal Permission Granted              Emotional Assessment Appearance:: Appears stated age   Affect (typically observed): Accepting Orientation: : Oriented to Self, Oriented to Place, Oriented to  Time, Oriented to Situation Alcohol / Substance Use: Not Applicable Psych Involvement: No (comment)  Admission diagnosis:  Paresthesia [R20.2] Traumatic rhabdomyolysis, initial encounter ([place]) [T79.6XXA] Patient Active Problem List   Diagnosis Date Noted  . Rhabdomyolysis 03/30/2019  . AKI (acute kidney injury) ([place]) 03/30/2019  . Tobacco abuse 03/30/2019  . Fall 03/30/2019  . HTN (hypertension) 03/30/2019  . Leukocytosis 03/30/2019  . Paresthesia   . Hyperthyroidism 12/25/2016  . Asymptomatic bacteriuria during pregnancy 12/03/2016  . Supervision of high-risk pregnancy 11/28/2016  . Chronic hypertension 11/28/2016  . Hypertension in pregnancy, antepartum 11/28/2016  . Morbid obesity with BMI of 40.0-44.9, adult ([place]) 05/02/2015  . Incompetent cervix 04/15/2015  . Hx of PTL (preterm labor), current pregnancy 04/14/2015  . Short cervix 04/14/2015   PCP:  Patient, No Pcp Per Pharmacy:   [facility] [place], [place]  [address] [place] [id number] Phone: [phone] Fax: [phone]     Social Determinants of Health (SDOH) Interventions    Readmission Risk Interventions No flowsheet data found.

## 2019-04-01 ENCOUNTER — Inpatient Hospital Stay (HOSPITAL_COMMUNITY)
Admit: 2019-04-01 | Discharge: 2019-04-01 | Disposition: A | Discharge status: Home / Self Care | Payer: Self-pay | Attending: Internal Medicine | Admitting: Internal Medicine

## 2019-04-01 DIAGNOSIS — R569 Unspecified convulsions: Secondary | ICD-10-CM

## 2019-04-01 LAB — BASIC METABOLIC PANEL
Anion gap: 11 (ref 5–15)
BUN: 23 mg/dL — ABNORMAL HIGH (ref 6–20)
CO2: 25 mmol/L (ref 22–32)
Calcium: 8.5 mg/dL — ABNORMAL LOW (ref 8.9–10.3)
Chloride: 103 mmol/L (ref 98–111)
Creatinine, Ser: 2.65 mg/dL — ABNORMAL HIGH (ref 0.44–1.00)
GFR calc Af Amer: 27 mL/min — ABNORMAL LOW (ref >60)
GFR calc non Af Amer: 24 mL/min — ABNORMAL LOW (ref >60)
Glucose, Bld: 114 mg/dL — ABNORMAL HIGH (ref 70–99)
Potassium: 3.5 mmol/L (ref 3.5–5.1)
Sodium: 139 mmol/L (ref 135–145)

## 2019-04-01 LAB — URINE CULTURE: Culture: >=100000 — AB

## 2019-04-01 LAB — CK
Total CK: 22112 U/L — ABNORMAL HIGH (ref 38–234)
Total CK: 18330 U/L — ABNORMAL HIGH (ref 38–234)

## 2019-04-01 MED ORDER — LORAZEPAM 2 MG/ML IJ SOLN: 2.0000 mg | INTRAMUSCULAR | Status: DC | PRN

## 2019-04-01 MED ORDER — MAGIC MOUTHWASH
10.0000 mL | Freq: Four times a day (QID) | ORAL | Status: DC
  Administered 2019-04-01 (×3): 10 mL via ORAL
  Filled 2019-04-01 (×7): qty 10

## 2019-04-01 NOTE — Procedures (Signed)
ELECTROENCEPHALOGRAM REPORT   Patient: Kayla Richard       Room #: 8850 EEG No. ID: [id number] Age: 28 y.o.        Sex: female Referring Physician: Wyline Copas Report Date:  04/01/2019        Interpreting Physician: Alexis Goodell  History: Kayla Richard is an 28 y.o. female s/p fall  Medications:  Norvasc  Conditions of Recording:  This is a 21 channel routine scalp EEG performed with bipolar and monopolar montages arranged in accordance to the international 10/20 system of electrode placement. One channel was dedicated to EKG recording.  The patient is in the awake, drowsy and asleep states.  Description:  The waking background activity consists of a low voltage, symmetrical, fairly well organized, 10 Hz alpha activity, seen from the parieto-occipital and posterior temporal regions.  Low voltage fast activity, poorly organized, is seen anteriorly and is at times superimposed on more posterior regions.  A mixture of theta and alpha rhythms are seen from the central and temporal regions. The patient drowses with slowing to irregular, low voltage theta and beta activity.   The patient goes in to a light sleep with symmetrical sleep spindles, vertex central sharp transients and irregular slow activity.   No epileptiform activity is noted.   Hyperventilation and intermittent photic stimulation were not performed.  IMPRESSION: Normal electroencephalogram, awake and asleep. There are no focal lateralizing or epileptiform features.   Alexis Goodell, MD Neurology [phone] 04/01/2019, 1:54 PM

## 2019-04-01 NOTE — Progress Notes (Signed)
EEG complete - results pending 

## 2019-04-01 NOTE — Plan of Care (Signed)

## 2019-04-01 NOTE — Progress Notes (Signed)
PROGRESS NOTE    Kayla Richard  ZOX:[id number] DOB: 03/18/1991 DOA: 03/29/2019 PCP: Patient, No Pcp Per    Brief Narrative:  28 y.o.femalewith medical history significant ofmorbid obesity, hypertension,  tobacco abuse, who presents with fall and right foot and leg numbness. She fell out of bed and due to a recent left ankle fracture, was not able to get out of bed for many hours. She lay on her right side and eventually lost sensation in her right foot and lower leg prompting her to come to the ED. She was found to have rhabdomyolysis, AKI and a WBC count of 24.4.   Assessment & Plan:   Principal Problem:   Rhabdomyolysis Active Problems:   Morbid obesity with BMI of 40.0-44.9, adult (HCC)   Hyperthyroidism   AKI (acute kidney injury) (HCC)   Tobacco abuse   Fall   Paresthesia   HTN (hypertension)   Leukocytosis    Principal Problem:   Rhabdomyolysis - due to fall and laying on floor for many hours - Improving with IVF hydration - Renal function is improving -CK is trending down - Repeat bmet in AM and CK in AM  Active Problems: AKI - Renal function had been gradually worsening, however now showing improvement -Good urine output -Repeat bmet in AM  Elevated LFTs - suspect due to rhabdo- improving   Numbness left foot/ leg - likely secondary to nerve compression from laying on the floor and possibly nerve irritation from muscle injury- follow-  - MRI L spine ordered and reviewed, unremarkable, although indeterminate 12mm lesion inferiorly in the sacrum is noted. Consider pelvic MRI when more stable, can be done outpatient - CT head negative    Morbid obesity with BMI of 40.0-44.9, adult Body mass index is 47.78 kg/m. - recommend diet/lifestyle modification  Nicotine abuse -cont nicotine patch as tolerated  Leukocytosis - possibly stress response - Improving  Right ankle fracture -she has followed up with orthopedics as outpt and is using  crutches- will have outpt PT once discharged as it was ordered by ortho already - I noted that she does not have a brace- she tells me she was given a boot to wear on the right foot and I have recommended that someone bring it from home for her so that she can ambulate with PT in the hospital - Stable at present  HTN - not on any medications per med rec- - added Norvasc 7/14 -continue PRN Hydralazine as well  Fall, possible seizure -On further questioning, patient does not recall falling -Positive tongue biting on exam this AM. -Patient describes symptoms suggestive of posti-ictal symptoms including feeling markedly weak and very confused - Ordered and reviewed EEG, negative - Patient advised to not drive for at least 6 months or when cleared by Neurology or PCP  DVT prophylaxis: Heparin subQ Code Status: Full Family Communication: Pt in room, family not at bedside Disposition Plan: Uncertain at this time  Consultants:     Procedures:   EEG  Antimicrobials: Anti-infectives (From admission, onward)   Start     Dose/Rate Route Frequency Ordered Stop   03/30/19 0415  cefTRIAXone (ROCEPHIN) 1 g in sodium chloride 0.9 % 100 mL IVPB     1 g 200 mL/hr over 30 Minutes Intravenous  Once 03/30/19 0405 03/30/19 0452       Subjective: Reports continued L numbness, although improving  Objective: Vitals:   04/01/19 0635 04/01/19 0821 04/01/19 0926 04/01/19 1409  BP:  (!) 175/101 (!) 162/98 Marland Kitchen(!)  192/110  Pulse:  96 98 84  Resp:   19 20  Temp: 98.9 F (37.2 C)  98.8 F (37.1 C) 98.1 F (36.7 C)  TempSrc: Oral  Axillary Axillary  SpO2:  98% 97% 99%  Weight:      Height:        Intake/Output Summary (Last 24 hours) at 04/01/2019 1545 Last data filed at 04/01/2019 1418 Gross per 24 hour  Intake 3629.74 ml  Output 4950 ml  Net -1320.26 ml   Filed Weights   03/30/19 0445  Weight: 107.3 kg    Examination:  General exam: Appears calm and comfortable, bite marks on both  sides of tongue Respiratory system: Clear to auscultation. Respiratory effort normal. Cardiovascular system: S1 & S2 heard, RRR.  Gastrointestinal system: Abdomen is nondistended, soft and nontender. No organomegaly or masses felt. Normal bowel sounds heard. Central nervous system: Alert and oriented. No focal neurological deficits. Extremities: Symmetric 5 x 5 power. Skin: No rashes, lesions  Psychiatry: Judgement and insight appear normal. Mood & affect appropriate.   Data Reviewed: I have personally reviewed following labs and imaging studies  CBC: Recent Labs  Lab 03/29/19 2353 03/30/19 0748 03/31/19 0732  WBC 24.4* 19.7* 17.0*  NEUTROABS 19.7*  --   --   HGB 14.5 12.3 11.7*  HCT 43.5 39.5 38.0  MCV 91.6 93.4 93.4  PLT 286 237 212   Basic Metabolic Panel: Recent Labs  Lab 03/29/19 2353 03/30/19 0748 03/31/19 0732 04/01/19 0747  NA 139 140 140 139  K 4.3 3.9 3.9 3.5  CL 101 105 109 103  CO2 24 22 22 25   GLUCOSE 123* 120* 109* 114*  BUN 26* 30* 34* 23*  CREATININE 1.80* 2.18* 3.45* 2.65*  CALCIUM 7.9* 7.1* 8.1* 8.5*  MG  --  1.8  --   --   PHOS  --  4.2  --   --    GFR: Estimated Creatinine Clearance: 34.3 mL/min (A) (by C-G formula based on SCr of 2.65 mg/dL (H)). Liver Function Tests: Recent Labs  Lab 03/30/19 0748 03/31/19 0732  AST 664* 506*  ALT 256* 271*  ALKPHOS 51 48  BILITOT 0.2* 0.3  PROT 7.0 7.0  ALBUMIN 3.6 3.6   No results for input(s): LIPASE, AMYLASE in the last 168 hours. No results for input(s): AMMONIA in the last 168 hours. Coagulation Profile: No results for input(s): INR, PROTIME in the last 168 hours. Cardiac Enzymes: Recent Labs  Lab 03/30/19 0748 03/30/19 1744 03/31/19 0732 03/31/19 1820 04/01/19 0747  CKTOTAL >50,000* 48,417* 41,107* 16,10934,682* 22,112*   BNP (last 3 results) No results for input(s): PROBNP in the last 8760 hours. HbA1C: No results for input(s): HGBA1C in the last 72 hours. CBG: No results for input(s):  GLUCAP in the last 168 hours. Lipid Profile: No results for input(s): CHOL, HDL, LDLCALC, TRIG, CHOLHDL, LDLDIRECT in the last 72 hours. Thyroid Function Tests: Recent Labs    03/30/19 0748  TSH 1.387  FREET4 0.86  T3FREE 3.0   Anemia Panel: No results for input(s): VITAMINB12, FOLATE, FERRITIN, TIBC, IRON, RETICCTPCT in the last 72 hours. Sepsis Labs: No results for input(s): PROCALCITON, LATICACIDVEN in the last 168 hours.  Recent Results (from the past 240 hour(s))  Urine culture     Status: Abnormal   Collection Time: 03/30/19  2:52 AM   Specimen: Urine, Random  Result Value Ref Range Status   Specimen Description   Final    URINE, RANDOM Performed at [facility], [address] [place], [place] [address]    Special Requests   Final    NONE Performed at [facility], [address] [place], [place] [address]    Culture >=100,000 COLONIES/mL PROTEUS MIRABILIS (A)  Final   Report Status 04/01/2019 FINAL  Final   Organism ID, Bacteria PROTEUS MIRABILIS (A)  Final      Susceptibility   Proteus mirabilis - MIC*    AMPICILLIN <=2 SENSITIVE Sensitive     CEFAZOLIN <=4 SENSITIVE Sensitive     CEFTRIAXONE <=1 SENSITIVE Sensitive     CIPROFLOXACIN <=0.25 SENSITIVE Sensitive     GENTAMICIN <=1 SENSITIVE Sensitive     IMIPENEM 2 SENSITIVE Sensitive     NITROFURANTOIN 128 RESISTANT Resistant     TRIMETH/SULFA <=20 SENSITIVE Sensitive     AMPICILLIN/SULBACTAM <=2 SENSITIVE Sensitive     PIP/TAZO <=4 SENSITIVE Sensitive     * >=100,000 COLONIES/mL PROTEUS MIRABILIS  SARS Coronavirus 2 (CEPHEID - Performed in [facility] hospital lab), Hosp Order     Status: None   Collection Time: 03/30/19  3:11 AM   Specimen: Nasopharyngeal Swab  Result Value Ref Range Status   SARS Coronavirus 2 NEGATIVE NEGATIVE Final    Comment: (NOTE) If result is NEGATIVE SARS-CoV-2 target nucleic acids are NOT DETECTED. The SARS-CoV-2 RNA is generally  detectable in upper and lower  respiratory specimens during the acute phase of infection. The lowest  concentration of SARS-CoV-2 viral copies this assay can detect is 250  copies / mL. A negative result does not preclude SARS-CoV-2 infection  and should not be used as the sole basis for treatment or other  patient management decisions.  A negative result may occur with  improper specimen collection / handling, submission of specimen other  than nasopharyngeal swab, presence of viral mutation(s) within the  areas targeted by this assay, and inadequate number of viral copies  (<250 copies / mL). A negative result must be combined with clinical  observations, patient history, and epidemiological information. If result is POSITIVE SARS-CoV-2 target nucleic acids are DETECTED. The SARS-CoV-2 RNA is generally detectable in upper and lower  respiratory specimens dur ing the acute phase of infection.  Positive  results are indicative of active infection with SARS-CoV-2.  Clinical  correlation with patient history and other diagnostic information is  necessary to determine patient infection status.  Positive results do  not rule out bacterial infection or co-infection with other viruses. If result is PRESUMPTIVE POSTIVE SARS-CoV-2 nucleic acids MAY BE PRESENT.   A presumptive positive result was obtained on the submitted specimen  and confirmed on repeat testing.  While 2019 novel coronavirus  (SARS-CoV-2) nucleic acids may be present in the submitted sample  additional confirmatory testing may be necessary for epidemiological  and / or clinical management purposes  to differentiate between  SARS-CoV-2 and other Sarbecovirus currently known to infect humans.  If clinically indicated additional testing with an alternate test  methodology [phone]([id number]) is advised. The SARS-CoV-2 RNA is generally  detectable in upper and lower respiratory sp ecimens during the acute  phase of infection. The  expected result is Negative. Fact Sheet for Patients:  BoilerBrush.com.cyhttps://www.fda.gov/media/136312/download Fact Sheet for Healthcare Providers: https://pope.com/https://www.fda.gov/media/136313/download This test is not yet approved or cleared by the [place] FDA and has been authorized for detection and/or diagnosis of SARS-CoV-2 by FDA under an Emergency Use Authorization (EUA).  This EUA will remain in effect (meaning this test can be used) for the duration  of the COVID-19 declaration under Section 564(b)(1) of the Act, 21 U.S.C. section 360bbb-3(b)(1), unless the authorization is terminated or revoked sooner. Performed at [facility], [place] [address] [place], [place] [address]      Radiology Studies: Mr Lumbar Spine Wo Contrast  Result Date: 03/31/2019 CLINICAL DATA:  Left leg numbness and tingling. Limited mobility. [place] out of bed 2 nights ago. EXAM: MRI LUMBAR SPINE WITHOUT CONTRAST TECHNIQUE: Multiplanar, multisequence MR imaging of the lumbar spine was performed. No intravenous contrast was administered. COMPARISON:  Sacrum/coccyx radiographs 03/30/2019. FINDINGS: Image quality is degraded by body habitus and mild motion artifact. Segmentation: Normal lumbar segmentation is assumed with the lowest fully formed disc space designated L5-S1. Alignment:  Normal. Vertebrae: No fracture, suspicious osseous lesion, or significant marrow edema in the lumbar spine. Partially visualized 12 mm T2 hyperintense focus in the S5 segment with thin peripheral rim of T1 hyperintense/fatty marrow signal. Conus medullaris and cauda equina: Conus extends to the upper L2 level. Conus and cauda equina appear normal. Paraspinal and other soft tissues: Moderate bladder distension. Small volume fluid between the rectum and sacrum. Disc levels: Preserved disc height and hydration throughout the lumbar spine. No disc herniation is identified, and the spinal canal and neural foramina are patent throughout. IMPRESSION:  1. Unremarkable appearance of the lumbar spine. 2. Partially visualized 12 mm lesion inferiorly in the sacrum, indeterminate though with a benign appearance. Pelvic MRI could be considered for further evaluation if clinically desired. Electronically Signed   By: Logan Bores M.D.   On: 03/31/2019 15:31   US Renal  Result Date: 03/31/2019 CLINICAL DATA:  Acute kidney injury. EXAM: RENAL / URINARY TRACT ULTRASOUND COMPLETE COMPARISON:  None. FINDINGS: Right Kidney: Renal measurements: 12.4 x 6.9 x 7.9 cm = volume: 355 mL. Echogenic cortex. No mass. Mild hydronephrosis. Left Kidney: Renal measurements: 12.7 x 7.9 x 6.8 cm = volume: 357 mL. Echogenic cortex. No mass. Mild hydronephrosis. Bladder: Appears normal for degree of bladder distention. IMPRESSION: 1. Mild bilateral hydronephrosis. 2. Echogenic renal cortices bilaterally. Differential considerations include glomerulonephritis, vasculitis, acute interstitial nephritis and HIV nephropathy. Electronically Signed   By: Franki Cabot M.D.   On: 03/31/2019 11:42    Scheduled Meds:  amLODipine  10 mg Oral Daily   heparin  5,000 Units Subcutaneous Q8H   magic mouthwash  10 mL Oral QID   nicotine  21 mg Transdermal Daily   Continuous Infusions:  sodium chloride 125 mL/hr at 04/01/19 0600   sodium chloride 150 mL/hr at 04/01/19 7322     LOS: 2 days   Marylu Lund, MD [facility] Pager On Amion  If 7PM-7AM, please contact night-coverage 04/01/2019, 3:45 PM

## 2019-04-01 NOTE — Progress Notes (Signed)
Occupational Therapy Treatment Patient Details Name: Kayla Richard M Jonsson MRN: [id number] DOB: 07/03/1991 Today's Date: 04/01/2019    History of present illness 28 yo female admitted to ED 7/12 with LLE, specifically foot and ankle, numbness after fall from bed where pt stayed on the ground for unknown amount of time. Pt with rhabdomyolysis with resultant AKI. Imaging of L ankle/foot negative for acute findings, R ankle with small fracture fragments arising from distal anterior tibia (originally fractured R ankle on 5/30, has been on crutches since). CT of sacrum and coccyx negative for acute fracture or findings. PMH includes morbid obesity, HTN, hypothyroidism, tobacco abuse.   OT comments  Educated on AE, practiced from bed level and issued to pt.  Pt was not wanting to get OOB when OT was present.  Follow Up Recommendations  No OT follow up;Supervision - Intermittent    Equipment Recommendations  3 in 1 bedside commode(wide)    Recommendations for Other Services      Precautions / Restrictions Precautions Precautions: Fall Restrictions Weight Bearing Restrictions: No Other Position/Activity Restrictions: Pt reports she does not have WB precautions of RLE, originally fractured ankle on 5/30 and has been using axillary crutches since       Mobility Bed Mobility                  Transfers                      Balance                                           ADL either performed or assessed with clinical judgement   ADL                                         General ADL Comments: pt did not want to complete ADL this am nor get OOB yet. Agreeable to working with AE at bed level. Used reacher to simulate pants and sock aide for sock with set up; would need min guard for balance for activity     Vision       Perception     Praxis      Cognition Arousal/Alertness: Awake/alert Behavior During Therapy: WFL for tasks  assessed/performed Overall Cognitive Status: Within Functional Limits for tasks assessed                                          Exercises     Shoulder Instructions       General Comments      Pertinent Vitals/ Pain       Pain Assessment: Faces Faces Pain Scale: Hurts even more Pain Location: LLE Pain Intervention(s): Limited activity within patient's tolerance;Monitored during session  Home Living                                          Prior Functioning/Environment              Frequency  Min 2X/week        Progress Toward Goals  OT  Goals(current goals can now be found in the care plan section)  Progress towards OT goals: Progressing toward goals     Plan      Co-evaluation                 AM-PAC OT "6 Clicks" Daily Activity     Outcome Measure   Help from another person eating meals?: None Help from another person taking care of personal grooming?: A Little Help from another person toileting, which includes using toliet, bedpan, or urinal?: A Little Help from another person bathing (including washing, rinsing, drying)?: A Little Help from another person to put on and taking off regular upper body clothing?: A Little Help from another person to put on and taking off regular lower body clothing?: A Little 6 Click Score: 19    End of Session    OT Visit Diagnosis: Unsteadiness on feet (R26.81);Other abnormalities of gait and mobility (R26.89);Muscle weakness (generalized) (M62.81);Pain Pain - Right/Left: Left Pain - part of body: Leg   Activity Tolerance Patient limited by fatigue   Patient Left in chair;with call bell/phone within reach;with chair alarm set   Nurse Communication          Time: 2229-7989 OT Time Calculation (min): 10 min  Charges: OT General Charges $OT Visit: 1 Visit OT Treatments $Self Care/Home Management : 8-22 mins  Lesle Chris, OTR/L Acute Rehabilitation  Services [phone] [facility] pager [phone] office 04/01/2019   [place] 04/01/2019, 10:36 AM

## 2019-04-01 NOTE — Progress Notes (Signed)
Physical Therapy Treatment Patient Details Name: Kayla Richard MRN: [id number] DOB: 1990-11-28 Today's Date: 04/01/2019    History of Present Illness 28 yo female admitted to ED 7/12 with LLE, specifically foot and ankle, numbness after fall from bed where pt stayed on the ground for unknown amount of time. Pt with rhabdomyolysis with resultant AKI. Imaging of L ankle/foot negative for acute findings, R ankle with small fracture fragments arising from distal anterior tibia (originally fractured R ankle on 5/30, has been on crutches since). CT of sacrum and coccyx negative for acute fracture or findings. PMH includes morbid obesity, HTN, hypothyroidism, tobacco abuse.    PT Comments    Assisted OOB to amb a functional distance.  General Gait Details: used youth RW for increased support and safety.  tolerated an increased distance.  Decreased WBing thru L LE and decreased speed.  Pt still c/o L LE numbness.   Follow Up Recommendations  Home health PT;Supervision/Assistance - 24 hour     Equipment Recommendations  Rolling walker with 5" wheels(youth)    Recommendations for Other Services       Precautions / Restrictions Precautions Precautions: Fall Restrictions Weight Bearing Restrictions: No Other Position/Activity Restrictions: Pt reports she does not have WB precautions of RLE, originally fractured ankle on 5/30 and has been using axillary crutches since    Mobility  Bed Mobility Overal bed mobility: Modified Independent             General bed mobility comments: increased time and use of rail  Transfers Overall transfer level: Needs assistance Equipment used: Rolling walker (2 wheeled) Transfers: Sit to/from Stand Sit to Stand: Min guard;Supervision         General transfer comment: used Youth walker for increased support and safety vs crutches  Ambulation/Gait Ambulation/Gait assistance: Supervision;Min guard Gait Distance (Feet): 22 Feet Assistive device:  Rolling walker (2 wheeled) Gait Pattern/deviations: Step-to pattern;Decreased step length - right;Decreased step length - left;Trunk flexed;Decreased weight shift to left Gait velocity: decreased   General Gait Details: used youth RW for increased support and safety.  tolerated an increased distance.  Decreased WBing thru L LE and decreased speed.  Pt still c/o L LE numbness.   Stairs             Wheelchair Mobility    Modified Rankin (Stroke Patients Only)       Balance                                            Cognition Arousal/Alertness: Awake/alert Behavior During Therapy: WFL for tasks assessed/performed Overall Cognitive Status: Within Functional Limits for tasks assessed                                        Exercises      General Comments        Pertinent Vitals/Pain Pain Assessment: No/denies pain Pain Location: L LE is numb    Home Living                      Prior Function            PT Goals (current goals can now be found in the care plan section) Progress towards PT goals: Progressing toward goals    Frequency  Min 3X/week      PT Plan Current plan remains appropriate    Co-evaluation              AM-PAC PT "6 Clicks" Mobility   Outcome Measure  Help needed turning from your back to your side while in a flat bed without using bedrails?: A Little Help needed moving from lying on your back to sitting on the side of a flat bed without using bedrails?: A Little Help needed moving to and from a bed to a chair (including a wheelchair)?: A Little Help needed standing up from a chair using your arms (e.g., wheelchair or bedside chair)?: A Little Help needed to walk in hospital room?: A Little Help needed climbing 3-5 steps with a railing? : A Lot 6 Click Score: 17    End of Session Equipment Utilized During Treatment: Gait belt Activity Tolerance: Patient tolerated treatment  well Patient left: in bed;with call bell/phone within reach   PT Visit Diagnosis: Other abnormalities of gait and mobility (R26.89);Difficulty in walking, not elsewhere classified (R26.2)     Time: [id number] PT Time Calculation (min) (ACUTE ONLY): 13 min  Charges:  $Gait Training: 8-22 mins                     Felecia ShellingLori Jesselle Laflamme  PTA Acute  Rehabilitation Services Pager      [phone] Office      [phone]

## 2019-04-02 LAB — CK
Total CK: 11473 U/L — ABNORMAL HIGH (ref 38–234)
Total CK: 10323 U/L — ABNORMAL HIGH (ref 38–234)

## 2019-04-02 LAB — BASIC METABOLIC PANEL
Anion gap: 11 (ref 5–15)
BUN: 19 mg/dL (ref 6–20)
CO2: 22 mmol/L (ref 22–32)
Calcium: 8.6 mg/dL — ABNORMAL LOW (ref 8.9–10.3)
Chloride: 104 mmol/L (ref 98–111)
Creatinine, Ser: 1.8 mg/dL — ABNORMAL HIGH (ref 0.44–1.00)
GFR calc Af Amer: 44 mL/min — ABNORMAL LOW (ref >60)
GFR calc non Af Amer: 38 mL/min — ABNORMAL LOW (ref >60)
Glucose, Bld: 127 mg/dL — ABNORMAL HIGH (ref 70–99)
Potassium: 2.9 mmol/L — ABNORMAL LOW (ref 3.5–5.1)
Sodium: 137 mmol/L (ref 135–145)

## 2019-04-02 MED ORDER — METOPROLOL TARTRATE 25 MG PO TABS
25.0000 mg | ORAL_TABLET | Freq: Two times a day (BID) | ORAL | Status: DC
  Administered 2019-04-02 – 2019-04-03 (×3): 25 mg via ORAL
  Filled 2019-04-02 (×3): qty 1

## 2019-04-02 MED ORDER — MAGIC MOUTHWASH W/LIDOCAINE
10.0000 mL | Freq: Four times a day (QID) | ORAL | Status: DC | PRN
  Administered 2019-04-02: 10 mL via ORAL
  Filled 2019-04-02 (×2): qty 10

## 2019-04-02 MED ORDER — MORPHINE SULFATE (PF) 2 MG/ML IV SOLN
1.0000 mg | INTRAVENOUS | Status: DC | PRN
  Administered 2019-04-02: 1 mg via INTRAVENOUS
  Filled 2019-04-02: qty 1

## 2019-04-02 MED ORDER — LACTULOSE 10 GM/15ML PO SOLN
20.0000 g | ORAL | Status: AC
  Administered 2019-04-02: 20 g via ORAL
  Filled 2019-04-02: qty 30

## 2019-04-02 MED ORDER — POLYETHYLENE GLYCOL 3350 17 G PO PACK
17.0000 g | PACK | Freq: Every day | ORAL | Status: DC
  Administered 2019-04-02 – 2019-04-03 (×2): 17 g via ORAL
  Filled 2019-04-02 (×2): qty 1

## 2019-04-02 NOTE — Plan of Care (Signed)
reviewed

## 2019-04-02 NOTE — Progress Notes (Signed)
PROGRESS NOTE    Kayla Richard  VEL:[id number] DOB: 11-10-1990 DOA: 03/29/2019 PCP: Patient, No Pcp Per    Brief Narrative:  28 y.o.femalewith medical history significant ofmorbid obesity, hypertension,  tobacco abuse, who presents with fall and right foot and leg numbness. She fell out of bed and due to a recent left ankle fracture, was not able to get out of bed for many hours. She lay on her right side and eventually lost sensation in her right foot and lower leg prompting her to come to the ED. She was found to have rhabdomyolysis, AKI and a WBC count of 24.4.   Assessment & Plan:   Principal Problem:   Rhabdomyolysis Active Problems:   Morbid obesity with BMI of 40.0-44.9, adult (HCC)   Hyperthyroidism   AKI (acute kidney injury) ([place])   Tobacco abuse   Fall   Paresthesia   HTN (hypertension)   Leukocytosis    Principal Problem:   Rhabdomyolysis - due to fall and laying on floor for many hours - Continuing to improve with IVF hydration - Renal function is showing improvement - CK is showing trend down - Repeat daily bmet and CK  Active Problems: AKI -Renal function is improving -Good urine output -Recheck bmet in AM  Elevated LFTs - suspect due to rhabdo- improving   Numbness left foot/ leg - likely secondary to nerve compression from laying on the floor and possibly nerve irritation from muscle injury- follow-  - MRI L spine ordered and reviewed, unremarkable, although indeterminate 50mm lesion inferiorly in the sacrum is noted. Consider pelvic MRI when more stable, can be done outpatient - CT head negative - ROM and swelling to LLE improving    Morbid obesity with BMI of 40.0-44.9, adult Body mass index is 47.78 kg/m. - recommend diet/lifestyle modification  Nicotine abuse -cont nicotine patch as tolerated  Leukocytosis - possibly stress response - Had been trending down  Right ankle fracture -she has followed up with orthopedics as  outpt and is using crutches- will have outpt PT once discharged as it was ordered by ortho already - Stable at present  HTN - not on any medications per med rec- - added Norvasc 7/14 -continue PRN Hydralazine  - BP remains poorly controlled, possibly secondary to uncontrolled pain - Will add metoprolol  Fall, possible seizure -On further questioning, patient does not recall falling -Positive tongue biting on exam this AM. -Patient describes symptoms suggestive of posti-ictal symptoms including feeling markedly weak and very confused - Ordered and reviewed EEG, negative - Pt aware not drive for at least 6 months or when cleared by Neurology or PCP  Constipation -No BM in 6 days per patient -Moderate results with lactulose -Will continue on scheduled miralax  DVT prophylaxis: Heparin subQ Code Status: Full Family Communication: Pt in room, family not at bedside Disposition Plan: Home when CK <5000  Consultants:     Procedures:   EEG  Antimicrobials: Anti-infectives (From admission, onward)   Start     Dose/Rate Route Frequency Ordered Stop   03/30/19 0415  cefTRIAXone (ROCEPHIN) 1 g in sodium chloride 0.9 % 100 mL IVPB     1 g 200 mL/hr over 30 Minutes Intravenous  Once 03/30/19 0405 03/30/19 0452      Subjective: Reports continued L numbness and pain  Objective: Vitals:   04/02/19 0551 04/02/19 0635 04/02/19 0942 04/02/19 1323  BP: (!) 179/118 (!) 158/98 (!) 155/107 (!) 155/105  Pulse: 87 92  97  Resp: 16  16  Temp: 98.7 F (37.1 C)   98.8 F (37.1 C)  TempSrc: Oral   Oral  SpO2: 100% 100%  100%  Weight:      Height:        Intake/Output Summary (Last 24 hours) at 04/02/2019 1522 Last data filed at 04/02/2019 1353 Gross per 24 hour  Intake 3112.62 ml  Output 2100 ml  Net 1012.62 ml   Filed Weights   03/30/19 0445  Weight: 107.3 kg    Examination: General exam: Awake, laying in bed, in nad Respiratory system: Normal respiratory effort, no  wheezing Cardiovascular system: regular rate, s1, s2 Gastrointestinal system: decreased BS, obese, tenderness over L flank Central nervous system: CN2-12 grossly intact, strength intact Extremities: Perfused, good rom involving BLE Skin: Normal skin turgor, no notable skin lesions seen Psychiatry: Mood normal // no visual hallucinations   Data Reviewed: I have personally reviewed following labs and imaging studies  CBC: Recent Labs  Lab 03/29/19 2353 03/30/19 0748 03/31/19 0732  WBC 24.4* 19.7* 17.0*  NEUTROABS 19.7*  --   --   HGB 14.5 12.3 11.7*  HCT 43.5 39.5 38.0  MCV 91.6 93.4 93.4  PLT 286 237 212   Basic Metabolic Panel: Recent Labs  Lab 03/29/19 2353 03/30/19 0748 03/31/19 0732 04/01/19 0747 04/02/19 0841  NA 139 140 140 139 137  K 4.3 3.9 3.9 3.5 2.9*  CL 101 105 109 103 104  CO2 24 22 22 25 22   GLUCOSE 123* 120* 109* 114* 127*  BUN 26* 30* 34* 23* 19  CREATININE 1.80* 2.18* 3.45* 2.65* 1.80*  CALCIUM 7.9* 7.1* 8.1* 8.5* 8.6*  MG  --  1.8  --   --   --   PHOS  --  4.2  --   --   --    GFR: Estimated Creatinine Clearance: 50.5 mL/min (A) (by C-G formula based on SCr of 1.8 mg/dL (H)). Liver Function Tests: Recent Labs  Lab 03/30/19 0748 03/31/19 0732  AST 664* 506*  ALT 256* 271*  ALKPHOS 51 48  BILITOT 0.2* 0.3  PROT 7.0 7.0  ALBUMIN 3.6 3.6   No results for input(s): LIPASE, AMYLASE in the last 168 hours. No results for input(s): AMMONIA in the last 168 hours. Coagulation Profile: No results for input(s): INR, PROTIME in the last 168 hours. Cardiac Enzymes: Recent Labs  Lab 03/31/19 0732 03/31/19 1820 04/01/19 0747 04/01/19 1845 04/02/19 0841  CKTOTAL 41,107* 16,10934,682* 22,112* 18,330* 11,473*   BNP (last 3 results) No results for input(s): PROBNP in the last 8760 hours. HbA1C: No results for input(s): HGBA1C in the last 72 hours. CBG: No results for input(s): GLUCAP in the last 168 hours. Lipid Profile: No results for input(s):  CHOL, HDL, LDLCALC, TRIG, CHOLHDL, LDLDIRECT in the last 72 hours. Thyroid Function Tests: No results for input(s): TSH, T4TOTAL, FREET4, T3FREE, THYROIDAB in the last 72 hours. Anemia Panel: No results for input(s): VITAMINB12, FOLATE, FERRITIN, TIBC, IRON, RETICCTPCT in the last 72 hours. Sepsis Labs: No results for input(s): PROCALCITON, LATICACIDVEN in the last 168 hours.  Recent Results (from the past 240 hour(s))  Urine culture     Status: Abnormal   Collection Time: 03/30/19  2:52 AM   Specimen: Urine, Random  Result Value Ref Range Status   Specimen Description   Final    URINE, RANDOM Performed at [facility], [address] [place], [place] [address]    Special Requests   Final    NONE  Performed at [facility], [address] [place], [place] [address]    Culture >=100,000 COLONIES/mL PROTEUS MIRABILIS (A)  Final   Report Status 04/01/2019 FINAL  Final   Organism ID, Bacteria PROTEUS MIRABILIS (A)  Final      Susceptibility   Proteus mirabilis - MIC*    AMPICILLIN <=2 SENSITIVE Sensitive     CEFAZOLIN <=4 SENSITIVE Sensitive     CEFTRIAXONE <=1 SENSITIVE Sensitive     CIPROFLOXACIN <=0.25 SENSITIVE Sensitive     GENTAMICIN <=1 SENSITIVE Sensitive     IMIPENEM 2 SENSITIVE Sensitive     NITROFURANTOIN 128 RESISTANT Resistant     TRIMETH/SULFA <=20 SENSITIVE Sensitive     AMPICILLIN/SULBACTAM <=2 SENSITIVE Sensitive     PIP/TAZO <=4 SENSITIVE Sensitive     * >=100,000 COLONIES/mL PROTEUS MIRABILIS  SARS Coronavirus 2 (CEPHEID - Performed in [facility] hospital lab), Hosp Order     Status: None   Collection Time: 03/30/19  3:11 AM   Specimen: Nasopharyngeal Swab  Result Value Ref Range Status   SARS Coronavirus 2 NEGATIVE NEGATIVE Final    Comment: (NOTE) If result is NEGATIVE SARS-CoV-2 target nucleic acids are NOT DETECTED. The SARS-CoV-2 RNA is generally detectable in upper and lower  respiratory specimens during the  acute phase of infection. The lowest  concentration of SARS-CoV-2 viral copies this assay can detect is 250  copies / mL. A negative result does not preclude SARS-CoV-2 infection  and should not be used as the sole basis for treatment or other  patient management decisions.  A negative result may occur with  improper specimen collection / handling, submission of specimen other  than nasopharyngeal swab, presence of viral mutation(s) within the  areas targeted by this assay, and inadequate number of viral copies  (<250 copies / mL). A negative result must be combined with clinical  observations, patient history, and epidemiological information. If result is POSITIVE SARS-CoV-2 target nucleic acids are DETECTED. The SARS-CoV-2 RNA is generally detectable in upper and lower  respiratory specimens dur ing the acute phase of infection.  Positive  results are indicative of active infection with SARS-CoV-2.  Clinical  correlation with patient history and other diagnostic information is  necessary to determine patient infection status.  Positive results do  not rule out bacterial infection or co-infection with other viruses. If result is PRESUMPTIVE POSTIVE SARS-CoV-2 nucleic acids MAY BE PRESENT.   A presumptive positive result was obtained on the submitted specimen  and confirmed on repeat testing.  While 2019 novel coronavirus  (SARS-CoV-2) nucleic acids may be present in the submitted sample  additional confirmatory testing may be necessary for epidemiological  and / or clinical management purposes  to differentiate between  SARS-CoV-2 and other Sarbecovirus currently known to infect humans.  If clinically indicated additional testing with an alternate test  methodology [phone]([id number]) is advised. The SARS-CoV-2 RNA is generally  detectable in upper and lower respiratory sp ecimens during the acute  phase of infection. The expected result is Negative. Fact Sheet for Patients:   BoilerBrush.com.cyhttps://www.fda.gov/media/136312/download Fact Sheet for Healthcare Providers: https://pope.com/https://www.fda.gov/media/136313/download This test is not yet approved or cleared by the [place] FDA and has been authorized for detection and/or diagnosis of SARS-CoV-2 by FDA under an Emergency Use Authorization (EUA).  This EUA will remain in effect (meaning this test can be used) for the duration of the COVID-19 declaration under Section 564(b)(1) of the Act, 21 U.S.C. section 360bbb-3(b)(1), unless the authorization is terminated or revoked  sooner. Performed at [facility], [address] [place], [place] [address]      Radiology Studies: No results found.  Scheduled Meds: . amLODipine  10 mg Oral Daily  . heparin  5,000 Units Subcutaneous Q8H  . nicotine  21 mg Transdermal Daily   Continuous Infusions: . sodium chloride 125 mL/hr at 04/02/19 0949  . sodium chloride Stopped (04/01/19 2200)     LOS: 3 days   Rickey BarbaraStephen Mikhala Kenan, MD [facility] Pager On Amion  If 7PM-7AM, please contact night-coverage 04/02/2019, 3:22 PM

## 2019-04-02 NOTE — Progress Notes (Signed)
Physical Therapy Progress Note  Clinical Impression: Pt seen for LE strengthening therex; however, greatly limited this session secondary to increased L LE pain. Pt's RN notified that pt requested more pain meds. Pt would continue to benefit from skilled physical therapy services at this time while admitted and after d/c to address the below listed limitations in order to improve overall safety and independence with functional mobility.  Sherie Don, [place], DPT  Acute Rehabilitation Services Pager [phone] Office [phone]    04/02/19 1100  PT Visit Information  Last PT Received On 04/02/19  Assistance Needed +1  History of Present Illness Pt is a 28 y/o female admitted to ED 7/12 with LLE, specifically foot and ankle, numbness after fall from bed where pt stayed on the ground for unknown amount of time. Pt with rhabdomyolysis with resultant AKI. Imaging of L ankle/foot negative for acute findings, R ankle with small fracture fragments arising from distal anterior tibia (originally fractured R ankle on 5/30, has been on crutches since). CT of sacrum and coccyx negative for acute fracture or findings. PMH includes morbid obesity, HTN, hypothyroidism, tobacco abuse.  Precautions  Precautions Fall  Restrictions  Weight Bearing Restrictions No  Pain Assessment  Pain Assessment 0-10  Pain Score 8  Pain Location L foot, L ankle and L lateral thigh  Pain Descriptors / Indicators Aching;Grimacing;Guarding;Sore  Pain Intervention(s) Monitored during session;Repositioned;Patient requesting pain meds-RN notified  Cognition  Arousal/Alertness Awake/alert  Behavior During Therapy WFL for tasks assessed/performed  Overall Cognitive Status Within Functional Limits for tasks assessed  Exercises  Exercises Total Joint  Total Joint Exercises  Ankle Circles/Pumps AROM;Both;20 reps;Supine  Gluteal Sets AROM;Strengthening;Both;10 reps;Supine  Heel Slides AROM;Strengthening;Left;10 reps;Supine   Hip ABduction/ADduction AROM;Strengthening;Left;10 reps;Supine  Bridges AROM;Strengthening;Both;10 reps;Supine  PT - End of Session  Activity Tolerance Patient limited by pain  Patient left in bed;with call bell/phone within reach  Nurse Communication Mobility status   PT - Assessment/Plan  PT Plan Current plan remains appropriate  PT Visit Diagnosis Other abnormalities of gait and mobility (R26.89);Difficulty in walking, not elsewhere classified (R26.2)  PT Frequency (ACUTE ONLY) Min 3X/week  Follow Up Recommendations Home health PT;Supervision/Assistance - 24 hour  PT equipment Rolling walker with 5" wheels;Other (comment) (youth size RW)  AM-PAC PT "6 Clicks" Mobility Outcome Measure (Version 2)  Help needed turning from your back to your side while in a flat bed without using bedrails? 3  Help needed moving from lying on your back to sitting on the side of a flat bed without using bedrails? 3  Help needed moving to and from a bed to a chair (including a wheelchair)? 3  Help needed standing up from a chair using your arms (e.g., wheelchair or bedside chair)? 3  Help needed to walk in hospital room? 3  Help needed climbing 3-5 steps with a railing?  2  6 Click Score 17  Consider Recommendation of Discharge To: Home with [facility]  PT Goal Progression  Progress towards PT goals Progressing toward goals  Acute Rehab PT Goals  PT Goal Formulation With patient  Time For Goal Achievement 04/13/19  Potential to Achieve Goals Good  PT Time Calculation  PT Start Time (ACUTE ONLY) 1039  PT Stop Time (ACUTE ONLY) 1049  PT Time Calculation (min) (ACUTE ONLY) 10 min  PT General Charges  $$ ACUTE PT VISIT 1 Visit  PT Treatments  $Therapeutic Exercise 8-22 mins

## 2019-04-03 LAB — CBC
HCT: 39.2 % (ref 36.0–46.0)
Hemoglobin: 12.6 g/dL (ref 12.0–15.0)
MCH: 29.4 pg (ref 26.0–34.0)
MCHC: 32.1 g/dL (ref 30.0–36.0)
MCV: 91.6 fL (ref 80.0–100.0)
Platelets: 236 10*3/uL (ref 150–400)
RBC: 4.28 MIL/uL (ref 3.87–5.11)
RDW: 12.7 % (ref 11.5–15.5)
WBC: 15 10*3/uL — ABNORMAL HIGH (ref 4.0–10.5)
nRBC: 0 % (ref 0.0–0.2)

## 2019-04-03 LAB — BASIC METABOLIC PANEL
Anion gap: 9 (ref 5–15)
BUN: 17 mg/dL (ref 6–20)
CO2: 27 mmol/L (ref 22–32)
Calcium: 8.7 mg/dL — ABNORMAL LOW (ref 8.9–10.3)
Chloride: 102 mmol/L (ref 98–111)
Creatinine, Ser: 1.6 mg/dL — ABNORMAL HIGH (ref 0.44–1.00)
GFR calc Af Amer: 50 mL/min — ABNORMAL LOW (ref >60)
GFR calc non Af Amer: 43 mL/min — ABNORMAL LOW (ref >60)
Glucose, Bld: 150 mg/dL — ABNORMAL HIGH (ref 70–99)
Potassium: 3.4 mmol/L — ABNORMAL LOW (ref 3.5–5.1)
Sodium: 138 mmol/L (ref 135–145)

## 2019-04-03 LAB — CK: Total CK: 5830 U/L — ABNORMAL HIGH (ref 38–234)

## 2019-04-03 MED ORDER — DOCUSATE SODIUM 100 MG PO CAPS: 100.0000 mg | ORAL_CAPSULE | Freq: Two times a day (BID) | ORAL | 0 refills | Status: AC

## 2019-04-03 MED ORDER — NICOTINE 21 MG/24HR TD PT24: 21.0000 mg | MEDICATED_PATCH | Freq: Every day | TRANSDERMAL | 0 refills | Status: DC

## 2019-04-03 MED ORDER — POTASSIUM CHLORIDE CRYS ER 20 MEQ PO TBCR
40.0000 meq | EXTENDED_RELEASE_TABLET | Freq: Once | ORAL | Status: AC
  Administered 2019-04-03: 40 meq via ORAL
  Filled 2019-04-03: qty 2

## 2019-04-03 MED ORDER — TRAMADOL HCL 50 MG PO TABS: 50.0000 mg | ORAL_TABLET | Freq: Four times a day (QID) | ORAL | 0 refills | Status: DC | PRN

## 2019-04-03 MED ORDER — AMLODIPINE BESYLATE 10 MG PO TABS: 10.0000 mg | ORAL_TABLET | Freq: Every day | ORAL | 0 refills | Status: DC

## 2019-04-03 MED ORDER — ACETAMINOPHEN 325 MG PO TABS: 650.0000 mg | ORAL_TABLET | Freq: Four times a day (QID) | ORAL | 0 refills | Status: DC | PRN

## 2019-04-03 MED ORDER — METOPROLOL TARTRATE 25 MG PO TABS: 25.0000 mg | ORAL_TABLET | Freq: Two times a day (BID) | ORAL | 0 refills | Status: DC

## 2019-04-03 MED ORDER — HYDRALAZINE HCL 25 MG PO TABS
25.0000 mg | ORAL_TABLET | Freq: Three times a day (TID) | ORAL | Status: DC
  Administered 2019-04-03: 25 mg via ORAL
  Filled 2019-04-03: qty 1

## 2019-04-03 MED ORDER — POLYETHYLENE GLYCOL 3350 17 G PO PACK: 17.0000 g | PACK | Freq: Every day | ORAL | 0 refills | Status: AC

## 2019-04-03 MED ORDER — HYDRALAZINE HCL 25 MG PO TABS: 25.0000 mg | ORAL_TABLET | Freq: Three times a day (TID) | ORAL | 0 refills | Status: DC

## 2019-04-03 NOTE — Progress Notes (Signed)
Pt's boyfriend came to pick up equipment and paperwork for her to fill out at [place].

## 2019-04-03 NOTE — Progress Notes (Signed)
Occupational Therapy Treatment Patient Details Name: Kayla Richard MRN: [id number] DOB: 05-Dec-1990 Today's Date: 04/03/2019    History of present illness Pt is a 28 y/o female admitted to ED 7/12 with LLE, specifically foot and ankle, numbness after fall from bed where pt stayed on the ground for unknown amount of time. Pt with rhabdomyolysis with resultant AKI. Imaging of L ankle/foot negative for acute findings, R ankle with small fracture fragments arising from distal anterior tibia (originally fractured R ankle on 5/30, has been on crutches since). CT of sacrum and coccyx negative for acute fracture or findings. PMH includes morbid obesity, HTN, hypothyroidism, tobacco abuse.   OT comments  Completed education on retrieving adl items with reacher/RW, use of AE during adls, and shower transfer.  Pt plans home today.  Supervision level--no further OT needs  Follow Up Recommendations  No OT follow up;Supervision - Intermittent    Equipment Recommendations  3 in 1 bedside commode(wide)    Recommendations for Other Services      Precautions / Restrictions Precautions Precautions: Fall       Mobility Bed Mobility Overal bed mobility: Modified Independent                Transfers Overall transfer level: Modified independent                    Balance                                           ADL either performed or assessed with clinical judgement   ADL                                         General ADL Comments: reviewed AE and pt used reacher to retrieve items and remove sock as well as sock aide with supervision.  Pt safely mobilizing in room. Reviewed use of walker to step backwards into shower.     Vision       Perception     Praxis      Cognition Arousal/Alertness: Awake/alert Behavior During Therapy: WFL for tasks assessed/performed Overall Cognitive Status: Within Functional Limits for tasks assessed                                           Exercises     Shoulder Instructions       General Comments      Pertinent Vitals/ Pain       Pain Assessment: Faces Faces Pain Scale: Hurts little more Pain Location: L foot Pain Descriptors / Indicators: Sore Pain Intervention(s): Limited activity within patient's tolerance;Monitored during session;Repositioned  Home Living                                          Prior Functioning/Environment              Frequency           Progress Toward Goals  OT Goals(current goals can now be found in the care plan section)  Progress towards OT goals: Progressing toward goals  Plan      Co-evaluation                 AM-PAC OT "6 Clicks" Daily Activity     Outcome Measure   Help from another person eating meals?: None Help from another person taking care of personal grooming?: A Little Help from another person toileting, which includes using toliet, bedpan, or urinal?: A Little Help from another person bathing (including washing, rinsing, drying)?: A Little Help from another person to put on and taking off regular upper body clothing?: A Little Help from another person to put on and taking off regular lower body clothing?: A Little 6 Click Score: 19    End of Session    OT Visit Diagnosis: Unsteadiness on feet (R26.81);Other abnormalities of gait and mobility (R26.89);Muscle weakness (generalized) (M62.81);Pain Pain - Right/Left: Left Pain - part of body: Leg   Activity Tolerance Patient tolerated treatment well   Patient Left in bed;with call bell/phone within reach   Nurse Communication          Time: 1042-1050 OT Time Calculation (min): 8 min  Charges: OT General Charges $OT Visit: 1 Visit OT Treatments $Self Care/Home Management : 8-22 mins  Marica OtterMaryellen Jakeb Lamping, OTR/L Acute Rehabilitation Services [phone] [facility] pager [phone]  office 04/03/2019   Stacie Knutzen 04/03/2019, 10:56 AM

## 2019-04-03 NOTE — Discharge Instructions (Signed)
Recommend no driving for 6 months until cleared to do so by your PCP or Neurology

## 2019-04-03 NOTE — TOC Transition Note (Addendum)
Transition of Care [facility]) - CM/SW Discharge Note   Patient Details  Name: Kayla Richard MRN: [id number] Date of Birth: October 29, 1990  [facility] CM/SW Contact:  Lia Hopping, [place] Phone Number: 04/03/2019, 3:21 PM   Clinical Narrative:    Patient left hospital before DME was delivered. Patient did not have a definitive home address to send equipment. Patient inform nursing staff she will return to pick up her RW and the bedside commode.   CSW reached out to the patient and provided her with a follow up appointment for the [facility]. April 27, 2019 @ 2:30pm, patient reports understanding.   The patient decline the [facility] letter, she states that she can afford her medications.     Final next level of care: Home/Self Care Barriers to Discharge: No Barriers Identified   Patient Goals and CMS Choice        Discharge Placement  Home                     Discharge Plan and Services                DME Arranged: 3-N-1, Walker rolling DME Agency: [employer] Date DME Agency Contacted: 03/31/19 Time DME Agency Contacted: 70 Representative spoke with at DME Agency: [place] ([place]) Interventions     Readmission Risk Interventions No flowsheet data found.

## 2019-04-03 NOTE — Discharge Summary (Addendum)
Physician Discharge Summary  Kayla Richard ZOX:[id number] DOB: 01/03/1991 DOA: 03/29/2019  PCP: Patient, No Pcp Per  Admit date: 03/29/2019 Discharge date: 04/03/2019  Admitted From: Home Disposition:  Home  Recommendations for Outpatient Follow-up:  1. Follow up with PCP in 1-2 weeks 2. Follow up with Neurology as scheduled 3. Patient advised not to drive for 6 months or when cleared by PCP or Neurologist 4. Please continue to titrate BP meds as tolerated 5. Recommend repeat BMET in 1-2 weeks with PCP 6. Consider follow up MRI of sacral spine to follow benign appearing lesion seen incidentally  Discharge Condition:Improved CODE STATUS:Full Diet recommendation: Heart healthy   Brief/Interim Summary: 28 y.o.femalewith medical history significant ofmorbid obesity, hypertension, tobacco abuse, who presents with fall and right foot and leg numbness. She fell out of bed and due to a recent left ankle fracture, was not able to get out of bed for many hours. She lay on her right side and eventually lost sensation in her right foot and lower leg prompting her to come to the ED. She was found to have rhabdomyolysis, AKI and a WBC count of 24.4.   Discharge Diagnoses:  Principal Problem:   Rhabdomyolysis Active Problems:   Morbid obesity with BMI of 40.0-44.9, adult (HCC)   Hyperthyroidism   AKI (acute kidney injury) (HCC)   Tobacco abuse   Fall   Paresthesia   HTN (hypertension)   Leukocytosis  Principal Problem: Rhabdomyolysis - due to fall and laying on floor for many hours - Improved with IVF hydration - Renal function has shown much improvement - CK has trended down to 5k range - Recommend repeat renal panel in 1-2 week  Active Problems: AKI -Renal function is improving -Good urine output  Elevated LFTs - suspect due to rhabdo-improved  Numbness left foot/ leg - likely secondary to nerve compression from laying on the floor and possibly nerve irritation from  muscle injury- follow-  - MRI L spine ordered and reviewed, unremarkable, although indeterminate 12mm lesion inferiorly in the sacrum is noted. Consider follow up pelvic MRI when more stable, can be done outpatient - CT head negative - ROM and swelling to LLE improving  Morbid obesity with BMI of 40.0-44.9, adult -Body mass index is 47.78 kg/m. - recommend diet/lifestyle modification  Nicotine abuse -cont nicotine patch as tolerated  Leukocytosis - possibly stress response - Had been trending down  Right ankle fracture -she has followed up with orthopedics as outptand isusing crutches- will have outpt PT once discharged as it was ordered by ortho already - Remains stable at present  HTN - not on any medications per med rec- - added Norvasc 7/14 -continue PRN Hydralazine while in hospital - Added metoprolol 25mg  bid -BP remained poorly controlled. Outpatient records reviewed. Patient has a long history of very poorly controlled BP over past several years, not controlled -Will add low dose PO hydralazine on discharge. - Have instructed pt to follow up closely with PCP for further medication adjustments  Fall, possible seizure -On further questioning, patient does not recall falling -Evidence of positive tongue biting on exam this admit -Patient describes symptoms suggestive of posti-ictal symptoms including feeling markedly weak and very confused - Ordered and reviewed EEG, negative - Pt aware not drive for at least 6 months or when cleared by Neurology or PCP -Will arrange outpt follow up with Neurology as outpatient  Constipation -Moderate results with lactulose -Will continue on scheduled miralax  UTI present on admit Pt received rocephin in  ED  Discharge Instructions  Discharge Instructions    Ambulatory referral to Neurology   Complete by: As directed    An appointment is requested in approximately: 2 weeks for new seizure     Allergies as of  04/03/2019      Reactions   Hydrocodone Nausea And Vomiting   Bee Venom    Mango Flavor Hives      Medication List    STOP taking these medications   cephALEXin 500 MG capsule Commonly known as: KEFLEX   naproxen 500 MG tablet Commonly known as: NAPROSYN     TAKE these medications   acetaminophen 325 MG tablet Commonly known as: TYLENOL Take 2 tablets (650 mg total) by mouth every 6 (six) hours as needed for mild pain or moderate pain (or Fever >/= 101).   amLODipine 10 MG tablet Commonly known as: NORVASC Take 1 tablet (10 mg total) by mouth daily. Start taking on: April 04, 2019   docusate sodium 100 MG capsule Commonly known as: Colace Take 1 capsule (100 mg total) by mouth 2 (two) times daily.   hydrALAZINE 25 MG tablet Commonly known as: APRESOLINE Take 1 tablet (25 mg total) by mouth every 8 (eight) hours.   metoprolol tartrate 25 MG tablet Commonly known as: LOPRESSOR Take 1 tablet (25 mg total) by mouth 2 (two) times daily.   nicotine 21 mg/24hr patch Commonly known as: NICODERM CQ - dosed in mg/24 hours Place 1 patch (21 mg total) onto the skin daily. Start taking on: April 04, 2019   polyethylene glycol 17 g packet Commonly known as: MIRALAX / GLYCOLAX Take 17 g by mouth daily. Start taking on: April 04, 2019   traMADol 50 MG tablet Commonly known as: Ultram Take 1 tablet (50 mg total) by mouth every 6 (six) hours as needed.            Durable Medical Equipment  (From admission, onward)         Start     Ordered   04/03/19 1036  For home use only DME 3 n 1  Once     04/03/19 1035   04/01/19 0904  For home use only DME Walker rolling  Once    Comments: Patient needs a YOuth RW, thank you  Question:  Patient needs a walker to treat with the following condition  Answer:  Rhabdomyolysis   04/01/19 0904          Allergies  Allergen Reactions  . Hydrocodone Nausea And Vomiting  . Bee Venom   . Mango Flavor Hives      Procedures/Studies: Dg Sacrum/coccyx  Result Date: 03/30/2019 CLINICAL DATA:  Acute sacrum/coccyx pain following fall yesterday. Initial encounter. EXAM: SACRUM AND COCCYX - 2+ VIEW COMPARISON:  None. FINDINGS: There is no evidence of fracture or other focal bone lesions. IMPRESSION: Negative. Electronically Signed   By: Harmon PierJeffrey  Hu M.D.   On: 03/30/2019 09:39   Ct Head Wo Contrast  Result Date: 03/30/2019 CLINICAL DATA:  Fall with ataxia EXAM: CT HEAD WITHOUT CONTRAST TECHNIQUE: Contiguous axial images were obtained from the base of the skull through the vertex without intravenous contrast. COMPARISON:  August 18, 2009 FINDINGS: Brain: The ventricles are normal in size and configuration. There is no intracranial mass, hemorrhage, extra-axial fluid collection, or midline shift. The brain parenchyma appears unremarkable. No evident infarct. Vascular: There is no hyperdense vessel. There is no demonstrable vascular calcification. Skull: Bony calvarium appears intact. Sinuses/Orbits: There is slight mucosal thickening  in several ethmoid air cells. There is mild mucosal thickening in the posterior left sphenoid sinus. Other visualized paranasal sinuses are clear. Visualized orbits appear symmetric bilaterally. Other: Mastoid air cells are clear. IMPRESSION: Slight paranasal sinus disease.  Study otherwise unremarkable. Electronically Signed   By: Bretta Bang III M.D.   On: 03/30/2019 08:14   Mr Lumbar Spine Wo Contrast  Result Date: 03/31/2019 CLINICAL DATA:  Left leg numbness and tingling. Limited mobility. Larey Seat out of bed 2 nights ago. EXAM: MRI LUMBAR SPINE WITHOUT CONTRAST TECHNIQUE: Multiplanar, multisequence MR imaging of the lumbar spine was performed. No intravenous contrast was administered. COMPARISON:  Sacrum/coccyx radiographs 03/30/2019. FINDINGS: Image quality is degraded by body habitus and mild motion artifact. Segmentation: Normal lumbar segmentation is assumed with the lowest  fully formed disc space designated L5-S1. Alignment:  Normal. Vertebrae: No fracture, suspicious osseous lesion, or significant marrow edema in the lumbar spine. Partially visualized 12 mm T2 hyperintense focus in the S5 segment with thin peripheral rim of T1 hyperintense/fatty marrow signal. Conus medullaris and cauda equina: Conus extends to the upper L2 level. Conus and cauda equina appear normal. Paraspinal and other soft tissues: Moderate bladder distension. Small volume fluid between the rectum and sacrum. Disc levels: Preserved disc height and hydration throughout the lumbar spine. No disc herniation is identified, and the spinal canal and neural foramina are patent throughout. IMPRESSION: 1. Unremarkable appearance of the lumbar spine. 2. Partially visualized 12 mm lesion inferiorly in the sacrum, indeterminate though with a benign appearance. Pelvic MRI could be considered for further evaluation if clinically desired. Electronically Signed   By: Sebastian Ache M.D.   On: 03/31/2019 15:31   US Renal  Result Date: 03/31/2019 CLINICAL DATA:  Acute kidney injury. EXAM: RENAL / URINARY TRACT ULTRASOUND COMPLETE COMPARISON:  None. FINDINGS: Right Kidney: Renal measurements: 12.4 x 6.9 x 7.9 cm = volume: 355 mL. Echogenic cortex. No mass. Mild hydronephrosis. Left Kidney: Renal measurements: 12.7 x 7.9 x 6.8 cm = volume: 357 mL. Echogenic cortex. No mass. Mild hydronephrosis. Bladder: Appears normal for degree of bladder distention. IMPRESSION: 1. Mild bilateral hydronephrosis. 2. Echogenic renal cortices bilaterally. Differential considerations include glomerulonephritis, vasculitis, acute interstitial nephritis and HIV nephropathy. Electronically Signed   By: Bary Richard M.D.   On: 03/31/2019 11:42   Dg Foot Complete Left  Result Date: 03/29/2019 CLINICAL DATA:  Fall from bed with decreased sensation in the foot, initial encounter EXAM: LEFT FOOT - COMPLETE 3+ VIEW COMPARISON:  None. FINDINGS: There is  no evidence of fracture or dislocation. There is no evidence of arthropathy or other focal bone abnormality. Soft tissues are unremarkable. IMPRESSION: No acute abnormality noted. Electronically Signed   By: Alcide Clever M.D.   On: 03/29/2019 23:46     Subjective: Very eager to go home  Discharge Exam: Vitals:   04/02/19 2300 04/03/19 0609  BP: (!) 157/99 (!) 164/110  Pulse:  79  Resp:  20  Temp:  98.7 F (37.1 C)  SpO2:  99%   Vitals:   04/02/19 1945 04/02/19 2155 04/02/19 2300 04/03/19 0609  BP:  (!) 195/124 (!) 157/99 (!) 164/110  Pulse: 88 82  79  Resp: Temp:  99.3 F (37.4 C)  98.7 F (37.1 C)  TempSrc:  Oral  Oral  SpO2:  99%  99%  Weight:      Height:        General: Pt is alert, awake, not in acute distress Cardiovascular:  RRR, S1/S2 +, no rubs, no gallops Respiratory: CTA bilaterally, no wheezing, no rhonchi Abdominal: Soft, NT, ND, bowel sounds + Extremities: no edema, no cyanosis   The results of significant diagnostics from this hospitalization (including imaging, microbiology, ancillary and laboratory) are listed below for reference.     Microbiology: Recent Results (from the past 240 hour(s))  Urine culture     Status: Abnormal   Collection Time: 03/30/19  2:52 AM   Specimen: Urine, Random  Result Value Ref Range Status   Specimen Description   Final    URINE, RANDOM Performed at [facility] [address], [place], [place] [address]    Special Requests   Final    NONE Performed at [facility], [place] [address], [place],  [address]    Culture >=100,000 COLONIES/mL PROTEUS MIRABILIS (A)  Final   Report Status 04/01/2019 FINAL  Final   Organism ID, Bacteria PROTEUS MIRABILIS (A)  Final      Susceptibility   Proteus mirabilis - MIC*    AMPICILLIN <=2 SENSITIVE Sensitive     CEFAZOLIN <=4 SENSITIVE Sensitive     CEFTRIAXONE <=1 SENSITIVE Sensitive     CIPROFLOXACIN <=0.25 SENSITIVE  Sensitive     GENTAMICIN <=1 SENSITIVE Sensitive     IMIPENEM 2 SENSITIVE Sensitive     NITROFURANTOIN 128 RESISTANT Resistant     TRIMETH/SULFA <=20 SENSITIVE Sensitive     AMPICILLIN/SULBACTAM <=2 SENSITIVE Sensitive     PIP/TAZO <=4 SENSITIVE Sensitive     * >=100,000 COLONIES/mL PROTEUS MIRABILIS  SARS Coronavirus 2 (CEPHEID - Performed in [facility] hospital lab), Hosp Order     Status: None   Collection Time: 03/30/19  3:11 AM   Specimen: Nasopharyngeal Swab  Result Value Ref Range Status   SARS Coronavirus 2 NEGATIVE NEGATIVE Final    Comment: (NOTE) If result is NEGATIVE SARS-CoV-2 target nucleic acids are NOT DETECTED. The SARS-CoV-2 RNA is generally detectable in upper and lower  respiratory specimens during the acute phase of infection. The lowest  concentration of SARS-CoV-2 viral copies this assay can detect is 250  copies / mL. A negative result does not preclude SARS-CoV-2 infection  and should not be used as the sole basis for treatment or other  patient management decisions.  A negative result may occur with  improper specimen collection / handling, submission of specimen other  than nasopharyngeal swab, presence of viral mutation(s) within the  areas targeted by this assay, and inadequate number of viral copies  (<250 copies / mL). A negative result must be combined with clinical  observations, patient history, and epidemiological information. If result is POSITIVE SARS-CoV-2 target nucleic acids are DETECTED. The SARS-CoV-2 RNA is generally detectable in upper and lower  respiratory specimens dur ing the acute phase of infection.  Positive  results are indicative of active infection with SARS-CoV-2.  Clinical  correlation with patient history and other diagnostic information is  necessary to determine patient infection status.  Positive results do  not rule out bacterial infection or co-infection with other viruses. If result is PRESUMPTIVE POSTIVE SARS-CoV-2  nucleic acids MAY BE PRESENT.   A presumptive positive result was obtained on the submitted specimen  and confirmed on repeat testing.  While 2019 novel coronavirus  (SARS-CoV-2) nucleic acids may be present in the submitted sample  additional confirmatory testing may be necessary for epidemiological  and / or clinical management purposes  to differentiate between  SARS-CoV-2 and other Sarbecovirus currently known  to infect humans.  If clinically indicated additional testing with an alternate test  methodology [phone]) is advised. The SARS-CoV-2 RNA is generally  detectable in upper and lower respiratory sp ecimens during the acute  phase of infection. The expected result is Negative. Fact Sheet for Patients:  BoilerBrush.com.cy Fact Sheet for Healthcare Providers: https://pope.com/ This test is not yet approved or cleared by the [place] FDA and has been authorized for detection and/or diagnosis of SARS-CoV-2 by FDA under an Emergency Use Authorization (EUA).  This EUA will remain in effect (meaning this test can be used) for the duration of the COVID-19 declaration under Section 564(b)(1) of the Act, 21 U.S.C. section 360bbb-3(b)(1), unless the authorization is terminated or revoked sooner. Performed at [facility], [address] [place], [place] [address]      Labs: BNP (last 3 results) No results for input(s): BNP in the last 8760 hours. Basic Metabolic Panel: Recent Labs  Lab 03/30/19 0748 03/31/19 0732 04/01/19 0747 04/02/19 0841 04/03/19 0745  NA 140 140 139 137 138  K 3.9 3.9 3.5 2.9* 3.4*  CL 105 109 103 104 102  CO2 GLUCOSE 120* 109* 114* 127* 150*  BUN 30* 34* 23* 19 17  CREATININE 2.18* 3.45* 2.65* 1.80* 1.60*  CALCIUM 7.1* 8.1* 8.5* 8.6* 8.7*  MG 1.8  --   --   --   --   PHOS 4.2  --   --   --   --    Liver Function Tests: Recent Labs  Lab 03/30/19 0748  03/31/19 0732  AST 664* 506*  ALT 256* 271*  ALKPHOS 51 48  BILITOT 0.2* 0.3  PROT 7.0 7.0  ALBUMIN 3.6 3.6   No results for input(s): LIPASE, AMYLASE in the last 168 hours. No results for input(s): AMMONIA in the last 168 hours. CBC: Recent Labs  Lab 03/29/19 2353 03/30/19 0748 03/31/19 0732 04/03/19 0745  WBC 24.4* 19.7* 17.0* 15.0*  NEUTROABS 19.7*  --   --   --   HGB 14.5 12.3 11.7* 12.6  HCT 43.5 39.5 38.0 39.2  MCV 91.6 93.4 93.4 91.6  PLT 286 237 212 236   Cardiac Enzymes: Recent Labs  Lab 04/01/19 0747 04/01/19 1845 04/02/19 0841 04/02/19 1735 04/03/19 0745  CKTOTAL 22,112* 18,330* 11,473* 10,323* 5,830*   BNP: Invalid input(s): POCBNP CBG: No results for input(s): GLUCAP in the last 168 hours. D-Dimer No results for input(s): DDIMER in the last 72 hours. Hgb A1c No results for input(s): HGBA1C in the last 72 hours. Lipid Profile No results for input(s): CHOL, HDL, LDLCALC, TRIG, CHOLHDL, LDLDIRECT in the last 72 hours. Thyroid function studies No results for input(s): TSH, T4TOTAL, T3FREE, THYROIDAB in the last 72 hours.  Invalid input(s): FREET3 Anemia work up No results for input(s): VITAMINB12, FOLATE, FERRITIN, TIBC, IRON, RETICCTPCT in the last 72 hours. Urinalysis    Component Value Date/Time   COLORURINE BROWN (A) 03/30/2019 0252   APPEARANCEUR CLOUDY (A) 03/30/2019 0252   LABSPEC 1.015 03/30/2019 0252   PHURINE 6.0 03/30/2019 0252   GLUCOSEU 50 (A) 03/30/2019 0252   HGBUR LARGE (A) 03/30/2019 0252   BILIRUBINUR NEGATIVE 03/30/2019 0252   KETONESUR NEGATIVE 03/30/2019 0252   PROTEINUR 100 (A) 03/30/2019 0252   UROBILINOGEN 0.2 11/28/2016 1304   NITRITE NEGATIVE 03/30/2019 0252   LEUKOCYTESUR NEGATIVE 03/30/2019 0252   Sepsis Labs Invalid input(s): PROCALCITONIN,  WBC,  LACTICIDVEN Microbiology Recent Results (from the past 240 hour(s))  Urine culture     Status: Abnormal   Collection Time: 03/30/19  2:52 AM   Specimen: Urine,  Random  Result Value Ref Range Status   Specimen Description   Final    URINE, RANDOM Performed at [facility], [address] [place], [place] [address]    Special Requests   Final    NONE Performed at [facility], [address], [place], [place] [address]    Culture >=100,000 COLONIES/mL PROTEUS MIRABILIS (A)  Final   Report Status 04/01/2019 FINAL  Final   Organism ID, Bacteria PROTEUS MIRABILIS (A)  Final      Susceptibility   Proteus mirabilis - MIC*    AMPICILLIN <=2 SENSITIVE Sensitive     CEFAZOLIN <=4 SENSITIVE Sensitive     CEFTRIAXONE <=1 SENSITIVE Sensitive     CIPROFLOXACIN <=0.25 SENSITIVE Sensitive     GENTAMICIN <=1 SENSITIVE Sensitive     IMIPENEM 2 SENSITIVE Sensitive     NITROFURANTOIN 128 RESISTANT Resistant     TRIMETH/SULFA <=20 SENSITIVE Sensitive     AMPICILLIN/SULBACTAM <=2 SENSITIVE Sensitive     PIP/TAZO <=4 SENSITIVE Sensitive     * >=100,000 COLONIES/mL PROTEUS MIRABILIS  SARS Coronavirus 2 (CEPHEID - Performed in [facility] hospital lab), Hosp Order     Status: None   Collection Time: 03/30/19  3:11 AM   Specimen: Nasopharyngeal Swab  Result Value Ref Range Status   SARS Coronavirus 2 NEGATIVE NEGATIVE Final    Comment: (NOTE) If result is NEGATIVE SARS-CoV-2 target nucleic acids are NOT DETECTED. The SARS-CoV-2 RNA is generally detectable in upper and lower  respiratory specimens during the acute phase of infection. The lowest  concentration of SARS-CoV-2 viral copies this assay can detect is 250  copies / mL. A negative result does not preclude SARS-CoV-2 infection  and should not be used as the sole basis for treatment or other  patient management decisions.  A negative result may occur with  improper specimen collection / handling, submission of specimen other  than nasopharyngeal swab, presence of viral mutation(s) within the  areas targeted by this assay, and inadequate number of viral copies   (<250 copies / mL). A negative result must be combined with clinical  observations, patient history, and epidemiological information. If result is POSITIVE SARS-CoV-2 target nucleic acids are DETECTED. The SARS-CoV-2 RNA is generally detectable in upper and lower  respiratory specimens dur ing the acute phase of infection.  Positive  results are indicative of active infection with SARS-CoV-2.  Clinical  correlation with patient history and other diagnostic information is  necessary to determine patient infection status.  Positive results do  not rule out bacterial infection or co-infection with other viruses. If result is PRESUMPTIVE POSTIVE SARS-CoV-2 nucleic acids MAY BE PRESENT.   A presumptive positive result was obtained on the submitted specimen  and confirmed on repeat testing.  While 2019 novel coronavirus  (SARS-CoV-2) nucleic acids may be present in the submitted sample  additional confirmatory testing may be necessary for epidemiological  and / or clinical management purposes  to differentiate between  SARS-CoV-2 and other Sarbecovirus currently known to infect humans.  If clinically indicated additional testing with an alternate test  methodology [phone]) is advised. The SARS-CoV-2 RNA is generally  detectable in upper and lower respiratory sp ecimens during the acute  phase of infection. The expected result is Negative. Fact Sheet for Patients:  BoilerBrush.com.cy Fact Sheet for Healthcare Providers: https://pope.com/ This test is not  yet approved or cleared by the [place] FDA and has been authorized for detection and/or diagnosis of SARS-CoV-2 by FDA under an Emergency Use Authorization (EUA).  This EUA will remain in effect (meaning this test can be used) for the duration of the COVID-19 declaration under Section 564(b)(1) of the Act, 21 U.S.C. section 360bbb-3(b)(1), unless the authorization is terminated  or revoked sooner. Performed at [facility], [address] [place], [place] [id number]    Time spent: 30 min  SIGNED:   Rickey BarbaraStephen , MD  [facility] 04/03/2019, 10:36 AM  If 7PM-7AM, please contact night-coverage

## 2019-04-27 ENCOUNTER — Inpatient Hospital Stay: Payer: Self-pay | Admitting: Family Medicine

## 2019-05-06 ENCOUNTER — Encounter (HOSPITAL_COMMUNITY): Payer: Self-pay

## 2019-05-06 ENCOUNTER — Emergency Department (HOSPITAL_COMMUNITY)
Admission: EM | Admit: 2019-05-06 | Discharge: 2019-05-06 | Disposition: A | Discharge status: Home / Self Care | Payer: Self-pay | Attending: Emergency Medicine | Admitting: Emergency Medicine

## 2019-05-06 ENCOUNTER — Other Ambulatory Visit: Payer: Self-pay

## 2019-05-06 DIAGNOSIS — G629 Polyneuropathy, unspecified: Secondary | ICD-10-CM | POA: Insufficient documentation

## 2019-05-06 DIAGNOSIS — Z79899 Other long term (current) drug therapy: Secondary | ICD-10-CM | POA: Insufficient documentation

## 2019-05-06 DIAGNOSIS — I10 Essential (primary) hypertension: Secondary | ICD-10-CM | POA: Insufficient documentation

## 2019-05-06 DIAGNOSIS — F17210 Nicotine dependence, cigarettes, uncomplicated: Secondary | ICD-10-CM | POA: Insufficient documentation

## 2019-05-06 HISTORY — DX: Polyneuropathy, unspecified: G62.9

## 2019-05-06 MED ORDER — GABAPENTIN 100 MG PO CAPS: 100.0000 mg | ORAL_CAPSULE | Freq: Three times a day (TID) | ORAL | 0 refills | Status: DC

## 2019-05-06 NOTE — Discharge Instructions (Addendum)
Please return for any problem.  Follow-up with your regular care provider as instructed. °

## 2019-05-06 NOTE — ED Triage Notes (Signed)
Patient c/o left foot tingling and states she was diagnosed with neuropathy. Patient states she was seen recently and was given BP meds,but nothing for her neuropathy.

## 2019-05-06 NOTE — ED Notes (Signed)
No respiratory or acute distress noted resting in bed with eyes closed call light in reach. 

## 2019-05-06 NOTE — ED Provider Notes (Addendum)
[facility]-EMERGENCY DEPT Provider Note   CSN: [id number] Arrival date & time: 05/06/19  1505     History   Chief Complaint Chief Complaint  Patient presents with  . foot tingling    HPI Kayla Richard is a 28 y.o. female.     28 year old female with prior medical history as described below presents for evaluation of reported neuropathy.  Patient reports admission approximately 1 month ago after a fall.  Patient was found to be in rhabdo with likely neuropathic pain to the left lower extremity secondary to nerve compression.  Patient reports persistent numbness and tingling to the left leg ever since her admission 1 month prior.  She has not been taking any medications for same.  She has not yet followed up with anybody as an outpatient.  She presents today asking for medications to help with the neuropathic tingling and numbness in the left leg.  She denies other complaint.  She denies recent fall.  She reports good p.o. intake.  She denies associated fever, chest pain, shortness of breath, abdominal pain, weakness, or difficulty ambulating.  The history is provided by the patient and medical records.  Illness Location:  Neuropathy Severity:  Mild Onset quality:  Gradual Duration:  1 month Timing:  Constant Progression:  Waxing and waning Chronicity:  New Associated symptoms: no fever     Past Medical History:  Diagnosis Date  . Chronic hypertension   . Cyst of breast   . Neuropathy   . Preterm labor     Patient Active Problem List   Diagnosis Date Noted  . Rhabdomyolysis 03/30/2019  . AKI (acute kidney injury) (HCC) 03/30/2019  . Tobacco abuse 03/30/2019  . Fall 03/30/2019  . HTN (hypertension) 03/30/2019  . Leukocytosis 03/30/2019  . Paresthesia   . Hyperthyroidism 12/25/2016  . Asymptomatic bacteriuria during pregnancy 12/03/2016  . Supervision of high-risk pregnancy 11/28/2016  . Chronic hypertension 11/28/2016  . Hypertension in  pregnancy, antepartum 11/28/2016  . Morbid obesity with BMI of 40.0-44.9, adult (HCC) 05/02/2015  . Incompetent cervix 04/15/2015  . Hx of PTL (preterm labor), current pregnancy 04/14/2015  . Short cervix 04/14/2015    Past Surgical History:  Procedure Laterality Date  . CERVICAL CERCLAGE N/A 04/15/2015   Procedure: CERCLAGE CERVICAL;  Surgeon: Jaymes GraffNaima Dillard, MD;  Location: [facility];  Service: Gynecology;  Laterality: N/A;     OB History    Gravida  4   Para  1   Term  0   Preterm  0   AB  2   Living  0     SAB  1   TAB  1   Ectopic  0   Multiple  0   Live Births  0            Home Medications    Prior to Admission medications   Medication Sig Start Date End Date Taking? Authorizing Provider  acetaminophen (TYLENOL) 325 MG tablet Take 2 tablets (650 mg total) by mouth every 6 (six) hours as needed for mild pain or moderate pain (or Fever >/= 101). 04/03/19   Jerald Kiefhiu, Stephen K, MD  amLODipine (NORVASC) 10 MG tablet Take 1 tablet (10 mg total) by mouth daily. 04/04/19 05/04/19  Jerald Kiefhiu, Stephen K, MD  gabapentin (NEURONTIN) 100 MG capsule Take 1 capsule (100 mg total) by mouth 3 (three) times daily. 05/06/19 06/05/19  Wynetta FinesMessick, Peter C, MD  hydrALAZINE (APRESOLINE) 25 MG tablet Take 1 tablet (25 mg total) by  mouth every 8 (eight) hours. 04/03/19 05/03/19  Jerald Kiefhiu, Stephen K, MD  metoprolol tartrate (LOPRESSOR) 25 MG tablet Take 1 tablet (25 mg total) by mouth 2 (two) times daily. 04/03/19 05/03/19  Jerald Kiefhiu, Stephen K, MD  nicotine (NICODERM CQ - DOSED IN MG/24 HOURS) 21 mg/24hr patch Place 1 patch (21 mg total) onto the skin daily. 04/04/19   Jerald Kiefhiu, Stephen K, MD  traMADol (ULTRAM) 50 MG tablet Take 1 tablet (50 mg total) by mouth every 6 (six) hours as needed. 04/03/19   Jerald Kiefhiu, Stephen K, MD    Family History Family History  Problem Relation Age of Onset  . Hypertension Mother   . Diabetes Father     Social History Social History   Tobacco Use  . Smoking status: Current Every  Day Smoker    Packs/day: 0.25    Years: 6.00    Pack years: 1.50    Types: Cigarettes    Last attempt to quit: 10/28/2016    Years since quitting: 2.5  . Smokeless tobacco: Current User  Substance Use Topics  . Alcohol use: No  . Drug use: No     Allergies   Hydrocodone, Bee venom, and Mango flavor   Review of Systems Review of Systems  Constitutional: Negative for fever.  All other systems reviewed and are negative.    Physical Exam Updated Vital Signs BP (!) 143/109 (BP Location: Right Arm)   Pulse 96   Temp 98.7 F (37.1 C) (Oral)   Resp 15   Ht 4\' 11"  (1.499 m)   Wt 111.1 kg   LMP 04/29/2019 (Approximate)   SpO2 100%   BMI 49.48 kg/m   Physical Exam Vitals signs and nursing note reviewed.  Constitutional:      General: She is not in acute distress.    Appearance: She is well-developed.  HENT:     Head: Normocephalic and atraumatic.  Eyes:     Conjunctiva/sclera: Conjunctivae normal.     Pupils: Pupils are equal, round, and reactive to light.  Neck:     Musculoskeletal: Normal range of motion and neck supple.  Cardiovascular:     Rate and Rhythm: Normal rate and regular rhythm.     Heart sounds: Normal heart sounds.  Pulmonary:     Effort: Pulmonary effort is normal. No respiratory distress.     Breath sounds: Normal breath sounds.  Abdominal:     General: There is no distension.     Palpations: Abdomen is soft.     Tenderness: There is no abdominal tenderness.  Musculoskeletal: Normal range of motion.        General: No deformity.  Skin:    General: Skin is warm and dry.  Neurological:     General: No focal deficit present.     Mental Status: She is alert and oriented to person, place, and time. Mental status is at baseline.     Comments: Normal speech, no facial droop, ambulatory, normal gait, 5 out of 5 strength both lower extremities.  Distal sensation appears to be grossly intact both lower extremities.      ED Treatments / Results   Labs (all labs ordered are listed, but only abnormal results are displayed) Labs Reviewed - No data to display  EKG None  Radiology No results found.  Procedures Procedures (including critical care time)  Medications Ordered in ED Medications - No data to display   Initial Impression / Assessment and Plan / ED Course  I have reviewed the triage  vital signs and the nursing notes.  Pertinent labs & imaging results that were available during my care of the patient were reviewed by me and considered in my medical decision making (see chart for details).        MDM  Screen complete  IFE VITELLI was evaluated in Emergency Department on 05/06/2019 for the symptoms described in the history of present illness. She was evaluated in the context of the global COVID-19 pandemic, which necessitated consideration that the patient might be at risk for infection with the SARS-CoV-2 virus that causes COVID-19. Institutional protocols and algorithms that pertain to the evaluation of patients at risk for COVID-19 are in a state of rapid change based on information released by regulatory bodies including the CDC and federal and state organizations. These policies and algorithms were followed during the patient's care in the ED.  Patient is presenting with complaint of persistent neuropathic discomfort to the left lower extremity.  This appears to be an issue that first began approximately 1 month ago.  Her admission at that time suggest compression of the nerves into the left lower extremity has resulted in some neuropathy.  She does not appear to have a new more acute complaint.  She is offered a low-dose of gabapentin for possible improvement in her symptoms.  She is strongly encouraged to follow-up both with her regular care provider and also with neurology.  Strict return precautions given and understood.  Importance of close follow-up is repeatedly stressed.  Final Clinical Impressions(s) /  ED Diagnoses   Final diagnoses:  Neuropathy    ED Discharge Orders         Ordered    gabapentin (NEURONTIN) 100 MG capsule  3 times daily     05/06/19 1712           Valarie Merino, MD 05/06/19 1716    Valarie Merino, MD 05/06/19 [phone]

## 2020-10-16 IMAGING — MR MRI LUMBAR SPINE WITHOUT CONTRAST
4 of 5 series · 19 of 48 positions shown · non-contrast
Comparison: Sacrum/coccyx radiographs 03/30/2019.

CLINICAL DATA: Left leg numbness and tingling. Limited mobility.
Fell out of bed 2 nights ago.

EXAM:
MRI LUMBAR SPINE WITHOUT CONTRAST
TECHNIQUE: Multiplanar, multisequence MR imaging of the lumbar spine was
performed. No intravenous contrast was administered.

[Series 3: T1 · sagittal · 4.0mm · 0.51mm/px · 3 of 13 slices shown (1 of 2)]
[im 3/13]
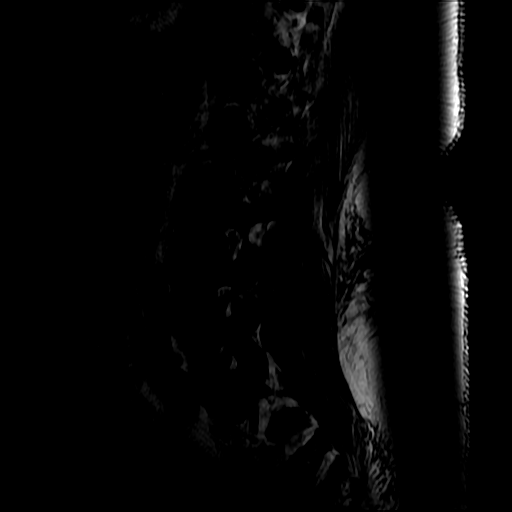
[im 8/13]
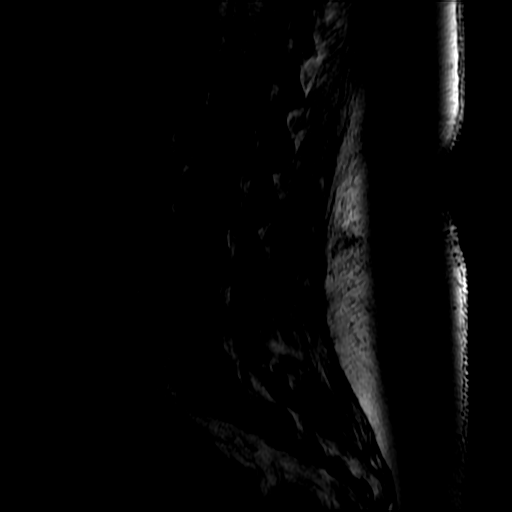
[im 13/13]
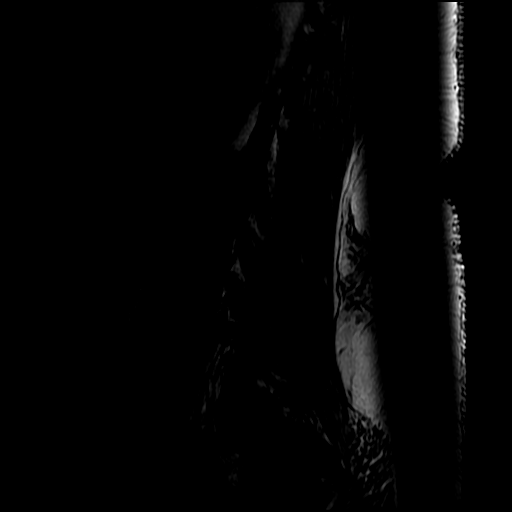

[Series 4: T2 · sagittal · 4.0mm · 0.51mm/px · 6 of 13 slices shown (1 of 2)]
[im 1/13]
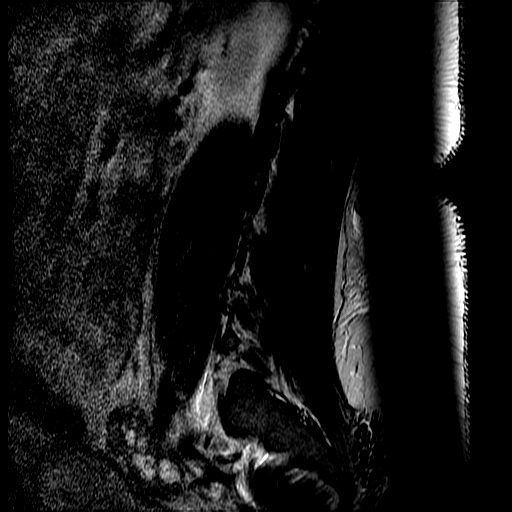
[im 3/13]
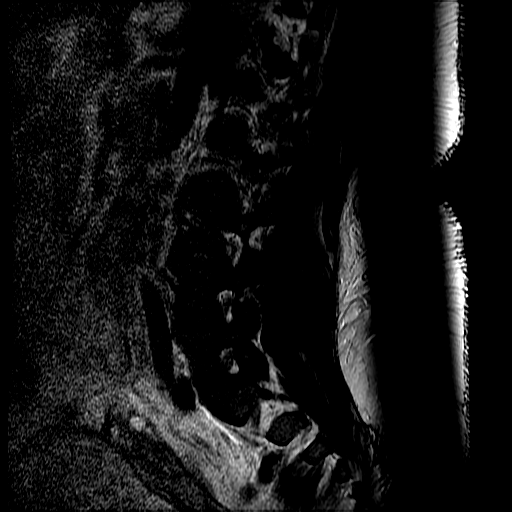
[im 5/13]
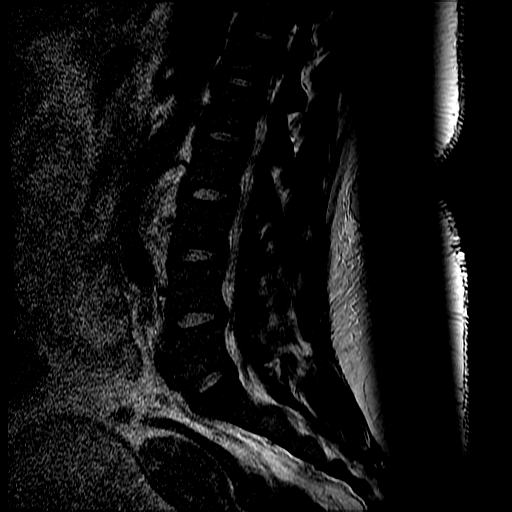
[im 8/13]
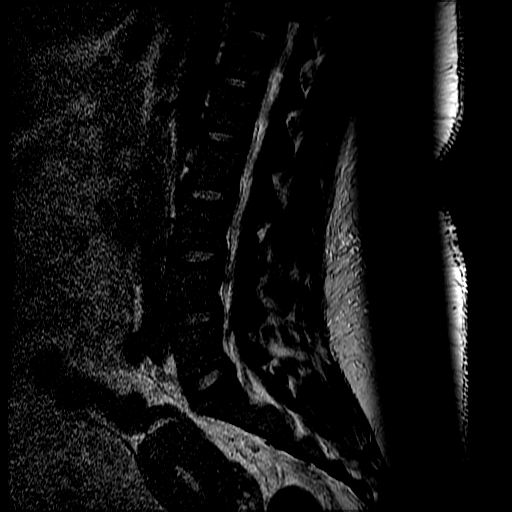
[im 10/13]
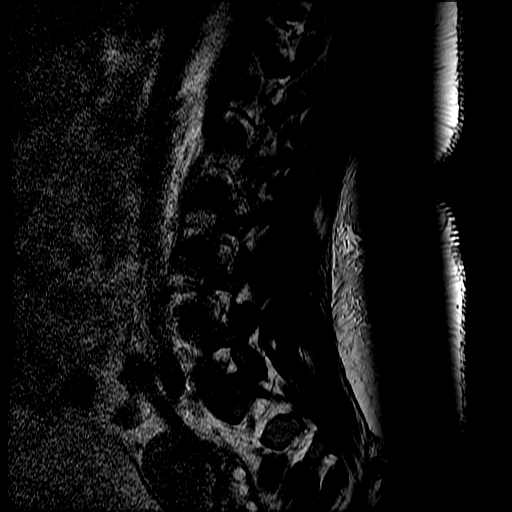
[im 13/13]
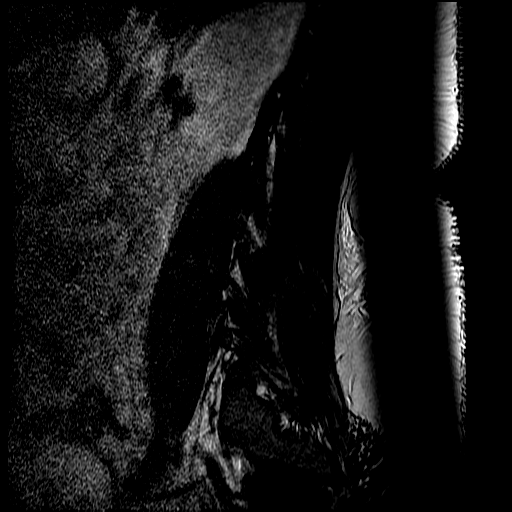

[Series 6: T2 · axial · 4.0mm · 0.39mm/px · z∈[-40,+117]mm · 7 of 32 slices shown (2 of 2)]
[im 1/32]
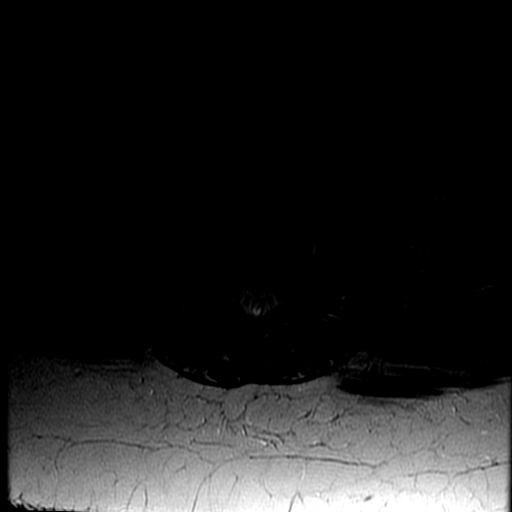
[im 5/32]
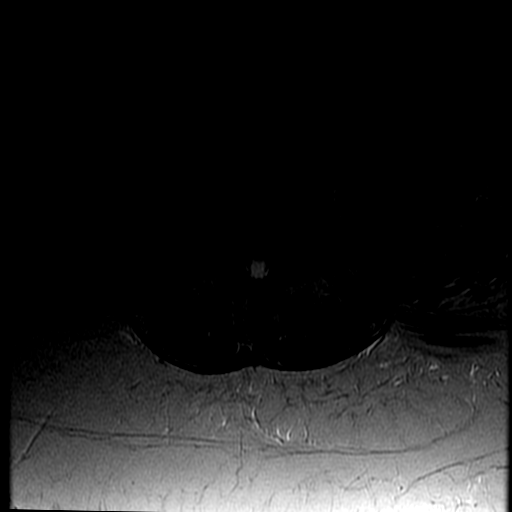
[im 9/32]
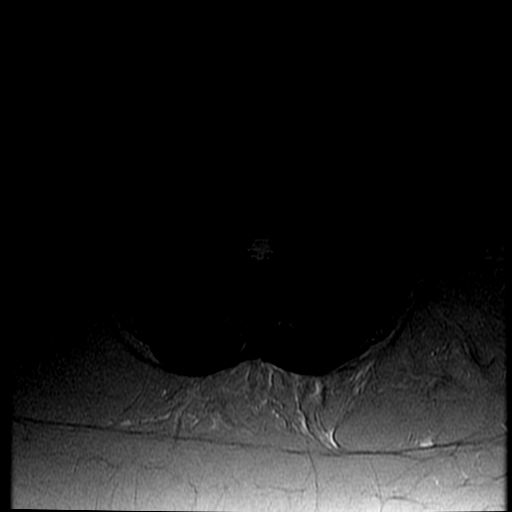
[im 14/32]
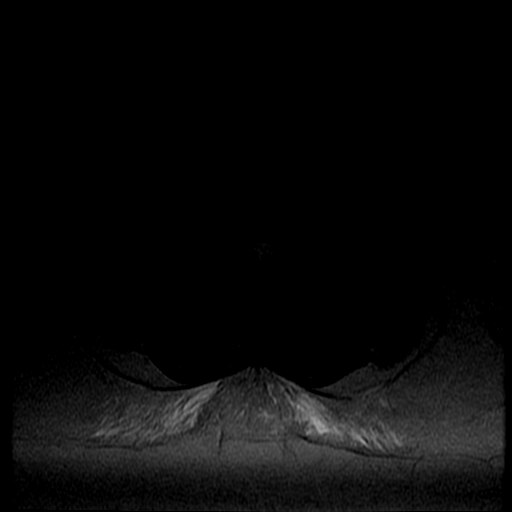
[im 16/32]
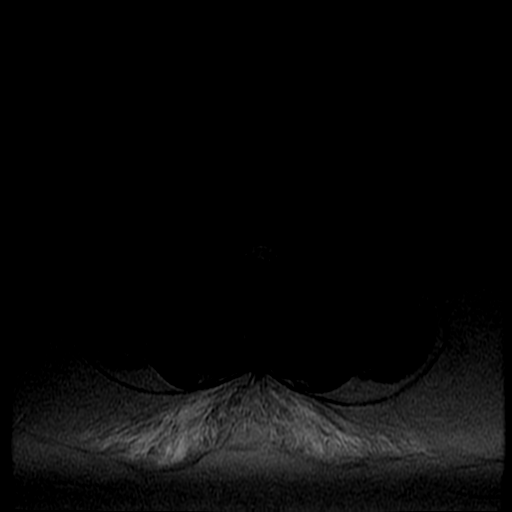
[im 18/32]
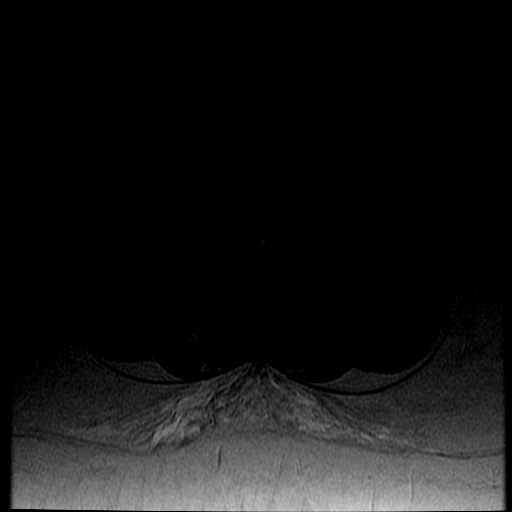
[im 27/32]
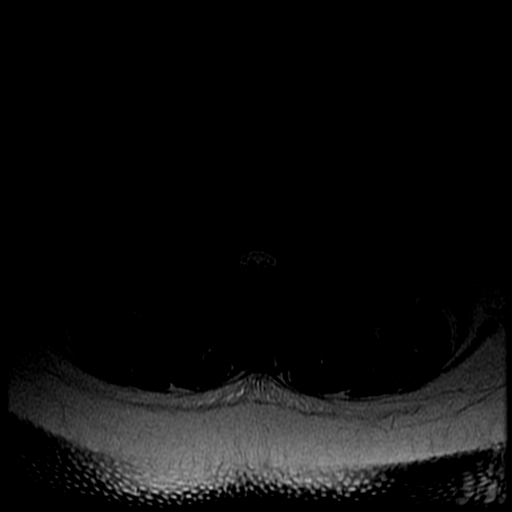

[Series 7: T1 · axial · 4.0mm · 0.39mm/px · z∈[-20,+117]mm · 3 of 32 slices shown (2 of 2)]
[im 5/32]
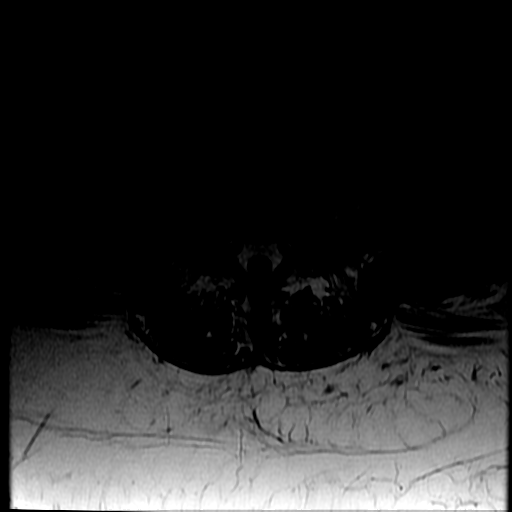
[im 16/32]
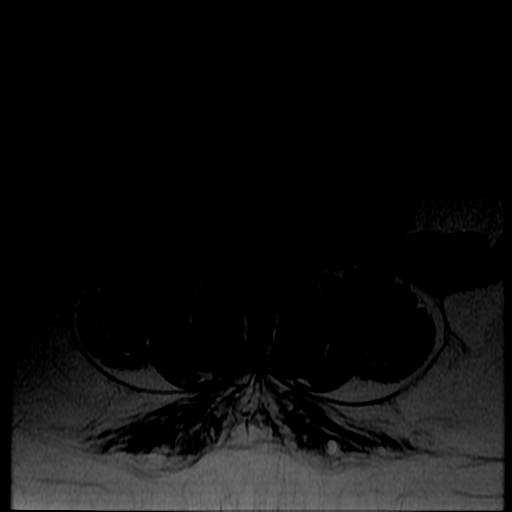
[im 27/32]
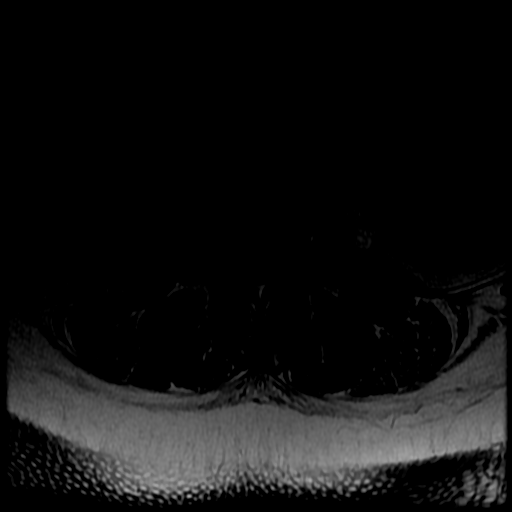

[19 of 48 positions shown; findings below may reference images not displayed]

FINDINGS: Image quality is degraded by body habitus and mild motion artifact.

Segmentation: Normal lumbar segmentation is assumed with the lowest
fully formed disc space designated L5-S1.

Alignment:  Normal.

Vertebrae: No fracture, suspicious osseous lesion, or significant
marrow edema in the lumbar spine. Partially visualized 12 mm T2
hyperintense focus in the S5 segment with thin peripheral rim of T1
hyperintense/fatty marrow signal.

Conus medullaris and cauda equina: Conus extends to the upper L2
level. Conus and cauda equina appear normal.

Paraspinal and other soft tissues: Moderate bladder distension.
Small volume fluid between the rectum and sacrum.

Disc levels:

Preserved disc height and hydration throughout the lumbar spine. No
disc herniation is identified, and the spinal canal and neural
foramina are patent throughout.
IMPRESSION: 1. Unremarkable appearance of the lumbar spine.
2. Partially visualized 12 mm lesion inferiorly in the sacrum,
indeterminate though with a benign appearance. Pelvic MRI could be
considered for further evaluation if clinically desired.

## 2022-07-26 ENCOUNTER — Other Ambulatory Visit: Payer: Self-pay

## 2022-07-26 ENCOUNTER — Emergency Department (HOSPITAL_COMMUNITY)
Admission: EM | Admit: 2022-07-26 | Discharge: 2022-07-26 | Disposition: A | Discharge status: Home / Self Care | Payer: Self-pay | Attending: Emergency Medicine | Admitting: Emergency Medicine

## 2022-07-26 ENCOUNTER — Encounter (HOSPITAL_COMMUNITY): Payer: Self-pay | Admitting: *Deleted

## 2022-07-26 ENCOUNTER — Emergency Department (HOSPITAL_COMMUNITY): Payer: Self-pay

## 2022-07-26 DIAGNOSIS — D72829 Elevated white blood cell count, unspecified: Secondary | ICD-10-CM | POA: Insufficient documentation

## 2022-07-26 DIAGNOSIS — J101 Influenza due to other identified influenza virus with other respiratory manifestations: Secondary | ICD-10-CM | POA: Insufficient documentation

## 2022-07-26 DIAGNOSIS — I10 Essential (primary) hypertension: Secondary | ICD-10-CM | POA: Insufficient documentation

## 2022-07-26 DIAGNOSIS — Z20822 Contact with and (suspected) exposure to covid-19: Secondary | ICD-10-CM | POA: Insufficient documentation

## 2022-07-26 DIAGNOSIS — Z79899 Other long term (current) drug therapy: Secondary | ICD-10-CM | POA: Insufficient documentation

## 2022-07-26 DIAGNOSIS — E876 Hypokalemia: Secondary | ICD-10-CM | POA: Insufficient documentation

## 2022-07-26 LAB — URINALYSIS, MICROSCOPIC (REFLEX)

## 2022-07-26 LAB — URINALYSIS, ROUTINE W REFLEX MICROSCOPIC
Bilirubin Urine: NEGATIVE
Glucose, UA: NEGATIVE mg/dL
Ketones, ur: NEGATIVE mg/dL
Leukocytes,Ua: NEGATIVE
Nitrite: NEGATIVE
Protein, ur: 100 mg/dL — AB
Specific Gravity, Urine: 1.02 (ref 1.005–1.030)
pH: 6.5 (ref 5.0–8.0)

## 2022-07-26 LAB — COMPREHENSIVE METABOLIC PANEL
ALT: 28 U/L (ref 0–44)
AST: 27 U/L (ref 15–41)
Albumin: 3.8 g/dL (ref 3.5–5.0)
Alkaline Phosphatase: 52 U/L (ref 38–126)
Anion gap: 11 (ref 5–15)
BUN: 7 mg/dL (ref 6–20)
CO2: 24 mmol/L (ref 22–32)
Calcium: 8.5 mg/dL — ABNORMAL LOW (ref 8.9–10.3)
Chloride: 99 mmol/L (ref 98–111)
Creatinine, Ser: 1.06 mg/dL — ABNORMAL HIGH (ref 0.44–1.00)
GFR, Estimated: >60 mL/min (ref >60)
Glucose, Bld: 152 mg/dL — ABNORMAL HIGH (ref 70–99)
Potassium: 2.9 mmol/L — ABNORMAL LOW (ref 3.5–5.1)
Sodium: 134 mmol/L — ABNORMAL LOW (ref 135–145)
Total Bilirubin: 0.4 mg/dL (ref 0.3–1.2)
Total Protein: 7.6 g/dL (ref 6.5–8.1)

## 2022-07-26 LAB — CBC WITH DIFFERENTIAL/PLATELET
Abs Immature Granulocytes: 0.08 10*3/uL — ABNORMAL HIGH (ref 0.00–0.07)
Basophils Absolute: 0 10*3/uL (ref 0.0–0.1)
Basophils Relative: 0 %
Eosinophils Absolute: 0 10*3/uL (ref 0.0–0.5)
Eosinophils Relative: 0 %
HCT: 38.9 % (ref 36.0–46.0)
Hemoglobin: 12.8 g/dL (ref 12.0–15.0)
Immature Granulocytes: 1 %
Lymphocytes Relative: 7 %
Lymphs Abs: 0.7 10*3/uL (ref 0.7–4.0)
MCH: 28.3 pg (ref 26.0–34.0)
MCHC: 32.9 g/dL (ref 30.0–36.0)
MCV: 85.9 fL (ref 80.0–100.0)
Monocytes Absolute: 0.8 10*3/uL (ref 0.1–1.0)
Monocytes Relative: 8 %
Neutro Abs: 9.1 10*3/uL — ABNORMAL HIGH (ref 1.7–7.7)
Neutrophils Relative %: 84 %
Platelets: 180 10*3/uL (ref 150–400)
RBC: 4.53 MIL/uL (ref 3.87–5.11)
RDW: 13.8 % (ref 11.5–15.5)
WBC: 10.7 10*3/uL — ABNORMAL HIGH (ref 4.0–10.5)
nRBC: 0 % (ref 0.0–0.2)

## 2022-07-26 LAB — RESP PANEL BY RT-PCR (FLU A&B, COVID) ARPGX2
Influenza A by PCR: POSITIVE — AB
Influenza B by PCR: NEGATIVE
SARS Coronavirus 2 by RT PCR: NEGATIVE

## 2022-07-26 LAB — POC URINE PREG, ED: Preg Test, Ur: NEGATIVE

## 2022-07-26 MED ORDER — ONDANSETRON 4 MG PO TBDP: 4.0000 mg | ORAL_TABLET | Freq: Three times a day (TID) | ORAL | 0 refills | Status: DC | PRN

## 2022-07-26 MED ORDER — OSELTAMIVIR PHOSPHATE 75 MG PO CAPS: 75.0000 mg | ORAL_CAPSULE | Freq: Two times a day (BID) | ORAL | 0 refills | Status: DC

## 2022-07-26 MED ORDER — ACETAMINOPHEN 325 MG PO TABS
650.0000 mg | ORAL_TABLET | Freq: Once | ORAL | Status: AC
  Administered 2022-07-26: 650 mg via ORAL
  Filled 2022-07-26: qty 2

## 2022-07-26 MED ORDER — POTASSIUM CHLORIDE CRYS ER 20 MEQ PO TBCR: 20.0000 meq | EXTENDED_RELEASE_TABLET | Freq: Two times a day (BID) | ORAL | 0 refills | Status: DC

## 2022-07-26 NOTE — ED Provider Notes (Signed)
[facility] EMERGENCY DEPARTMENT Provider Note   CSN: [id number] Arrival date & time: 07/26/22  1006     History  Chief Complaint  Patient presents with   Fever    C/o generalized bodyaches.     Kayla Richard is a 31 y.o. female with history of hypertension and neuropathy who presents the emergency department complaining of fever and body aches starting 2 days ago.  States she has been coughing up yellow-colored sputum, with several episodes of nonbloody emesis.  Tried taking some Tylenol at home with minimal relief.  States last menstrual period was about a month ago.  No abdominal pain, urinary vaginal symptoms, change in bowel habits.   Fever Associated symptoms: congestion, cough, myalgias, nausea and vomiting        Home Medications Prior to Admission medications   Medication Sig Start Date End Date Taking? Authorizing Provider  ondansetron (ZOFRAN-ODT) 4 MG disintegrating tablet Take 1 tablet (4 mg total) by mouth every 8 (eight) hours as needed for nausea or vomiting. 07/26/22  Yes Thoams Siefert T, PA-C  oseltamivir (TAMIFLU) 75 MG capsule Take 1 capsule (75 mg total) by mouth every 12 (twelve) hours. 07/26/22  Yes Robin Petrakis T, PA-C  potassium chloride SA (KLOR-CON M) 20 MEQ tablet Take 1 tablet (20 mEq total) by mouth 2 (two) times daily for 4 days. 07/26/22 07/30/22 Yes Aidon Klemens T, PA-C  acetaminophen (TYLENOL) 325 MG tablet Take 2 tablets (650 mg total) by mouth every 6 (six) hours as needed for mild pain or moderate pain (or Fever >/= 101). 04/03/19   Jerald Kief, MD  amLODipine (NORVASC) 10 MG tablet Take 1 tablet (10 mg total) by mouth daily. 04/04/19 05/04/19  Jerald Kief, MD  gabapentin (NEURONTIN) 100 MG capsule Take 1 capsule (100 mg total) by mouth 3 (three) times daily. 05/06/19 06/05/19  Wynetta Fines, MD  hydrALAZINE (APRESOLINE) 25 MG tablet Take 1 tablet (25 mg total) by mouth every 8 (eight) hours. 04/03/19 05/03/19   Jerald Kief, MD  metoprolol tartrate (LOPRESSOR) 25 MG tablet Take 1 tablet (25 mg total) by mouth 2 (two) times daily. 04/03/19 05/03/19  Jerald Kief, MD  nicotine (NICODERM CQ - DOSED IN MG/24 HOURS) 21 mg/24hr patch Place 1 patch (21 mg total) onto the skin daily. 04/04/19   Jerald Kief, MD  traMADol (ULTRAM) 50 MG tablet Take 1 tablet (50 mg total) by mouth every 6 (six) hours as needed. 04/03/19   Jerald Kief, MD      Allergies    Hydrocodone, Bee venom, and Mango flavor    Review of Systems   Review of Systems  Constitutional:  Positive for fever.  HENT:  Positive for congestion.   Respiratory:  Positive for cough.   Gastrointestinal:  Positive for nausea and vomiting.  Musculoskeletal:  Positive for myalgias.  All other systems reviewed and are negative.   Physical Exam Updated Vital Signs Ht 5' (1.524 m)   Wt 115.7 kg   BMI 49.80 kg/m  Physical Exam Vitals and nursing note reviewed.  Constitutional:      Appearance: Normal appearance.  HENT:     Head: Normocephalic and atraumatic.     Nose: Congestion present.  Eyes:     Conjunctiva/sclera: Conjunctivae normal.  Cardiovascular:     Rate and Rhythm: Normal rate and regular rhythm.  Pulmonary:     Effort: Pulmonary effort is normal. No respiratory distress.     Breath  sounds: Normal breath sounds.  Abdominal:     General: There is no distension.     Palpations: Abdomen is soft.     Tenderness: There is no abdominal tenderness.  Skin:    General: Skin is warm and dry.  Neurological:     General: No focal deficit present.     Mental Status: She is alert.     ED Results / Procedures / Treatments   Labs (all labs ordered are listed, but only abnormal results are displayed) Labs Reviewed  RESP PANEL BY RT-PCR (FLU A&B, COVID) ARPGX2 - Abnormal; Notable for the following components:      Result Value   Influenza A by PCR POSITIVE (*)    All other components within normal limits  COMPREHENSIVE  METABOLIC PANEL - Abnormal; Notable for the following components:   Sodium 134 (*)    Potassium 2.9 (*)    Glucose, Bld 152 (*)    Creatinine, Ser 1.06 (*)    Calcium 8.5 (*)    All other components within normal limits  CBC WITH DIFFERENTIAL/PLATELET - Abnormal; Notable for the following components:   WBC 10.7 (*)    Neutro Abs 9.1 (*)    Abs Immature Granulocytes 0.08 (*)    All other components within normal limits  URINALYSIS, ROUTINE W REFLEX MICROSCOPIC - Abnormal; Notable for the following components:   Hgb urine dipstick LARGE (*)    Protein, ur 100 (*)    All other components within normal limits  URINALYSIS, MICROSCOPIC (REFLEX) - Abnormal; Notable for the following components:   Bacteria, UA RARE (*)    All other components within normal limits  POC URINE PREG, ED    EKG None  Radiology DG Chest 2 View  Result Date: 07/26/2022 CLINICAL DATA:  Cough, fever, shortness of breath for 2 days EXAM: CHEST - 2 VIEW COMPARISON:  01/15/2016 FINDINGS: Mild cardiomegaly. Mild diffuse interstitial opacity. The visualized skeletal structures are unremarkable. IMPRESSION: Mild cardiomegaly with mild diffuse interstitial opacity, suggesting edema or atypical/viral infection. No focal airspace opacity. Electronically Signed   By: Jearld Lesch M.D.   On: 07/26/2022 10:54    Procedures Procedures    Medications Ordered in ED Medications  acetaminophen (TYLENOL) tablet 650 mg (650 mg Oral Given 07/26/22 1021)    ED Course/ Medical Decision Making/ A&P Clinical Course as of 07/26/22 1250  Thu Jul 26, 2022  1239 DG Chest 2 View [CR]    Clinical Course User Index [CR] Peter Garter, [place]                           Medical Decision Making Risk Prescription drug management.  This patient is a 31 y.o. female  who presents to the ED for concern of fever and body aches x 2 days.   Differential diagnoses prior to evaluation: The emergent differential diagnosis includes, but is  not limited to,  upper respiratory infection, lower respiratory infection, allergies, asthma, irritants, sinus/esophageal foreign body, medications, reflux, interstitial lung disease, postnasal drip, viral illness, sepsis. This is not an exhaustive differential.   Past Medical History / Co-morbidities: hypertension and neuropathy  Physical Exam: Physical exam performed. The pertinent findings include: Initially febrile to 103 F, given tylenol. Appears fatigued. Clear lung sounds, no respiratory distress.   Lab Tests/Imaging studies: I personally interpreted labs/imaging and the pertinent results include:  respiratory panel positive for influenza A.  Mild leukocytosis of 10.7.  Potassium 2.9.  Kidney function stable compared to prior.  Urinalysis negative for infection.  Urine pregnancy negative.  Chest x-ray with diffuse interstitial opacity, suggesting edema versus viral infection.  Patient does not appear clinically volume overloaded, so I suspect this is likely related to her diagnosis of flu.  I agree with the radiologist interpretation.  Medications: I ordered medication including Tylenol.  I have reviewed the patients home medicines and have made adjustments as needed.   Disposition: After consideration of the diagnostic results and the patients response to treatment, I feel that emergency department workup does not suggest an emergent condition requiring admission or immediate intervention beyond what has been performed at this time. Patient with symptoms consistent with influenza.  Vitals are stable, low-grade fever.  No signs of dehydration, tolerating PO's.  Lungs are clear.   The plan is: Patient will be discharged with instructions to orally hydrate, rest, and use over-the-counter medications such as anti-inflammatories such as ibuprofen and Tylenol for fever.  Prescribed tamiflu. Potassium replacement prescribed for hypokalemia. The patient is safe for discharge and has been  instructed to return immediately for worsening symptoms, change in symptoms or any other concerns.  Final Clinical Impression(s) / ED Diagnoses Final diagnoses:  Influenza A  Hypokalemia    Rx / DC Orders ED Discharge Orders          Ordered    ondansetron (ZOFRAN-ODT) 4 MG disintegrating tablet  Every 8 hours PRN        07/26/22 1238    oseltamivir (TAMIFLU) 75 MG capsule  Every 12 hours        07/26/22 1238    potassium chloride SA (KLOR-CON M) 20 MEQ tablet  2 times daily        07/26/22 1249           Portions of this report may have been transcribed using voice recognition software. Every effort was made to ensure accuracy; however, inadvertent computerized transcription errors may be present.    Jeanella Flattery 07/26/22 1310    Benjiman Core, MD 07/26/22 1622

## 2022-07-26 NOTE — ED Provider Triage Note (Signed)
Emergency Medicine Provider Triage Evaluation Note  Kayla Richard , a 31 y.o. female  was evaluated in triage.  Pt complains of cough, diffuse myalgias/arthralgias, emesis, fever.  Patient states symptoms began abruptly approximately 2 days ago.  Reports taking Tylenol at home for fever with last dose last night.  Reports that chest pain with cough.  Reports 2 episodes of nonbloody emesis since symptom onset.  Denies abdominal pain, urinary/vaginal symptoms, change in bowel habits.  Last menstrual period "around 1 month ago." Review of Systems  Positive: See above Negative:   Physical Exam  Ht 5' (1.524 m)   Wt 115.7 kg   BMI 49.80 kg/m  Gen:   Awake, no distress   Resp:  Normal effort  MSK:   Moves extremities without difficulty  Other:    Medical Decision Making  Medically screening exam initiated at 10:20 AM.  Appropriate orders placed.  MARIETTE COWLEY was informed that the remainder of the evaluation will be completed by another provider, this initial triage assessment does not replace that evaluation, and the importance of remaining in the ED until their evaluation is complete.     Peter Garter, [place] 07/26/22 1022

## 2022-07-26 NOTE — Discharge Instructions (Addendum)
You were seen in the emergency department today for fever and body aches.  As we discussed your influenza A test is positive.  This is a viral illness very common at this time of year, and we normally treat with over-the-counter medications.  Symptoms can last for up to a week.  You can take ibuprofen or Tylenol for pain or fever, and I recommend alternating between the 2.  Make sure that you are drinking lots of fluids and getting plenty of rest. You can take decongestants as long as you take them with lots of water. You can use lozenges or chloraseptic spray as needed for sore throat.   Please use Tylenol or ibuprofen for pain.  You may use 600 mg ibuprofen every 6 hours or 1000 mg of Tylenol every 6 hours.  You may choose to alternate between the 2.  This would be most effective.  Do not exceed 4 g of Tylenol within 24 hours.  Do not exceed 3200 mg ibuprofen within 24 hours.  Continue to monitor how you are doing, and return to the emergency department for new or worsening symptoms such as chest pain, difficulty breathing not related to coughing, fever despite medication, or persistent vomiting or diarrhea.  Your potassium levels were also low. I've sent potassium replacement pills to your pharmacy for you to take for the next few days. I want you to follow up with your primary doctor to have your blood work checked in the next 1-2 weeks to make sure your levels have normalized.   It has been a pleasure taking care of you today and I hope you begin to feel better soon!

## 2022-07-26 NOTE — ED Triage Notes (Signed)
Patient presents to ED c/o fever and generalized bodyaches onset 2 days ago.  C/o productive cough yellow colored sputum .

## 2023-04-03 ENCOUNTER — Emergency Department (HOSPITAL_COMMUNITY): Payer: Self-pay

## 2023-04-03 ENCOUNTER — Other Ambulatory Visit: Payer: Self-pay

## 2023-04-03 ENCOUNTER — Encounter (HOSPITAL_COMMUNITY): Payer: Self-pay | Admitting: Internal Medicine

## 2023-04-03 ENCOUNTER — Inpatient Hospital Stay (HOSPITAL_COMMUNITY)
Admission: EM | Admit: 2023-04-03 | Discharge: 2023-04-06 | DRG: 092 | Disposition: A | Discharge status: Home / Self Care | Payer: Self-pay | Attending: Family Medicine | Admitting: Family Medicine

## 2023-04-03 DIAGNOSIS — Z79899 Other long term (current) drug therapy: Secondary | ICD-10-CM

## 2023-04-03 DIAGNOSIS — Z885 Allergy status to narcotic agent status: Secondary | ICD-10-CM

## 2023-04-03 DIAGNOSIS — R9431 Abnormal electrocardiogram [ECG] [EKG]: Secondary | ICD-10-CM | POA: Diagnosis present

## 2023-04-03 DIAGNOSIS — Z8249 Family history of ischemic heart disease and other diseases of the circulatory system: Secondary | ICD-10-CM

## 2023-04-03 DIAGNOSIS — K5900 Constipation, unspecified: Secondary | ICD-10-CM | POA: Diagnosis present

## 2023-04-03 DIAGNOSIS — G929 Unspecified toxic encephalopathy: Secondary | ICD-10-CM | POA: Diagnosis present

## 2023-04-03 DIAGNOSIS — F1910 Other psychoactive substance abuse, uncomplicated: Principal | ICD-10-CM

## 2023-04-03 DIAGNOSIS — E860 Dehydration: Secondary | ICD-10-CM | POA: Diagnosis present

## 2023-04-03 DIAGNOSIS — N390 Urinary tract infection, site not specified: Secondary | ICD-10-CM | POA: Diagnosis present

## 2023-04-03 DIAGNOSIS — F29 Unspecified psychosis not due to a substance or known physiological condition: Secondary | ICD-10-CM

## 2023-04-03 DIAGNOSIS — Z91030 Bee allergy status: Secondary | ICD-10-CM

## 2023-04-03 DIAGNOSIS — I10 Essential (primary) hypertension: Secondary | ICD-10-CM | POA: Diagnosis present

## 2023-04-03 DIAGNOSIS — E876 Hypokalemia: Secondary | ICD-10-CM | POA: Diagnosis present

## 2023-04-03 DIAGNOSIS — T43625A Adverse effect of amphetamines, initial encounter: Secondary | ICD-10-CM | POA: Diagnosis present

## 2023-04-03 DIAGNOSIS — Z833 Family history of diabetes mellitus: Secondary | ICD-10-CM

## 2023-04-03 DIAGNOSIS — T40715A Adverse effect of cannabis, initial encounter: Secondary | ICD-10-CM | POA: Diagnosis present

## 2023-04-03 DIAGNOSIS — T405X5A Adverse effect of cocaine, initial encounter: Secondary | ICD-10-CM | POA: Diagnosis present

## 2023-04-03 DIAGNOSIS — Z91018 Allergy to other foods: Secondary | ICD-10-CM

## 2023-04-03 DIAGNOSIS — Z6841 Body Mass Index (BMI) 40.0 and over, adult: Secondary | ICD-10-CM

## 2023-04-03 DIAGNOSIS — Z720 Tobacco use: Secondary | ICD-10-CM | POA: Diagnosis present

## 2023-04-03 DIAGNOSIS — G928 Other toxic encephalopathy: Principal | ICD-10-CM | POA: Diagnosis present

## 2023-04-03 DIAGNOSIS — N179 Acute kidney failure, unspecified: Secondary | ICD-10-CM | POA: Diagnosis present

## 2023-04-03 DIAGNOSIS — F17210 Nicotine dependence, cigarettes, uncomplicated: Secondary | ICD-10-CM | POA: Diagnosis present

## 2023-04-03 LAB — COMPREHENSIVE METABOLIC PANEL
ALT: 20 U/L (ref 0–44)
AST: 30 U/L (ref 15–41)
Albumin: 4.8 g/dL (ref 3.5–5.0)
Alkaline Phosphatase: 62 U/L (ref 38–126)
Anion gap: 19 — ABNORMAL HIGH (ref 5–15)
BUN: 15 mg/dL (ref 6–20)
CO2: 22 mmol/L (ref 22–32)
Calcium: 10 mg/dL (ref 8.9–10.3)
Chloride: 100 mmol/L (ref 98–111)
Creatinine, Ser: 2.19 mg/dL — ABNORMAL HIGH (ref 0.44–1.00)
GFR, Estimated: 30 mL/min — ABNORMAL LOW (ref >60)
Glucose, Bld: 143 mg/dL — ABNORMAL HIGH (ref 70–99)
Potassium: 2.6 mmol/L — CL (ref 3.5–5.1)
Sodium: 141 mmol/L (ref 135–145)
Total Bilirubin: 1 mg/dL (ref 0.3–1.2)
Total Protein: 9.2 g/dL — ABNORMAL HIGH (ref 6.5–8.1)

## 2023-04-03 LAB — CBC WITH DIFFERENTIAL/PLATELET
Abs Immature Granulocytes: 0.16 10*3/uL — ABNORMAL HIGH (ref 0.00–0.07)
Basophils Absolute: 0.1 10*3/uL (ref 0.0–0.1)
Basophils Relative: 0 %
Eosinophils Absolute: 0 10*3/uL (ref 0.0–0.5)
Eosinophils Relative: 0 %
HCT: 42.4 % (ref 36.0–46.0)
Hemoglobin: 13.8 g/dL (ref 12.0–15.0)
Immature Granulocytes: 1 %
Lymphocytes Relative: 10 %
Lymphs Abs: 2.3 10*3/uL (ref 0.7–4.0)
MCH: 28.4 pg (ref 26.0–34.0)
MCHC: 32.5 g/dL (ref 30.0–36.0)
MCV: 87.2 fL (ref 80.0–100.0)
Monocytes Absolute: 1.2 10*3/uL — ABNORMAL HIGH (ref 0.1–1.0)
Monocytes Relative: 5 %
Neutro Abs: 18.1 10*3/uL — ABNORMAL HIGH (ref 1.7–7.7)
Neutrophils Relative %: 84 %
Platelets: 276 10*3/uL (ref 150–400)
RBC: 4.86 MIL/uL (ref 3.87–5.11)
RDW: 13.1 % (ref 11.5–15.5)
WBC: 21.8 10*3/uL — ABNORMAL HIGH (ref 4.0–10.5)
nRBC: 0 % (ref 0.0–0.2)

## 2023-04-03 LAB — CBC
HCT: 36.8 % (ref 36.0–46.0)
Hemoglobin: 12 g/dL (ref 12.0–15.0)
MCH: 28.8 pg (ref 26.0–34.0)
MCHC: 32.6 g/dL (ref 30.0–36.0)
MCV: 88.2 fL (ref 80.0–100.0)
Platelets: 205 10*3/uL (ref 150–400)
RBC: 4.17 MIL/uL (ref 3.87–5.11)
RDW: 13.2 % (ref 11.5–15.5)
WBC: 17 10*3/uL — ABNORMAL HIGH (ref 4.0–10.5)
nRBC: 0 % (ref 0.0–0.2)

## 2023-04-03 LAB — RAPID URINE DRUG SCREEN, HOSP PERFORMED
Amphetamines: POSITIVE — AB
Barbiturates: NOT DETECTED
Benzodiazepines: NOT DETECTED
Cocaine: POSITIVE — AB
Opiates: NOT DETECTED
Tetrahydrocannabinol: POSITIVE — AB

## 2023-04-03 LAB — URINALYSIS, ROUTINE W REFLEX MICROSCOPIC
Bilirubin Urine: NEGATIVE
Glucose, UA: NEGATIVE mg/dL
Hgb urine dipstick: NEGATIVE
Ketones, ur: NEGATIVE mg/dL
Nitrite: NEGATIVE
Protein, ur: >=300 mg/dL — AB
Specific Gravity, Urine: 1.029 (ref 1.005–1.030)
pH: 5 (ref 5.0–8.0)

## 2023-04-03 LAB — CREATININE, SERUM
Creatinine, Ser: 1.45 mg/dL — ABNORMAL HIGH (ref 0.44–1.00)
GFR, Estimated: 49 mL/min — ABNORMAL LOW (ref >60)

## 2023-04-03 LAB — HIV ANTIBODY (ROUTINE TESTING W REFLEX): HIV Screen 4th Generation wRfx: NONREACTIVE

## 2023-04-03 LAB — HCG, SERUM, QUALITATIVE: Preg, Serum: NEGATIVE

## 2023-04-03 LAB — ETHANOL: Alcohol, Ethyl (B): <10 mg/dL (ref <10)

## 2023-04-03 MED ORDER — LORAZEPAM 2 MG/ML IJ SOLN: 1.0000 mg | Freq: Four times a day (QID) | INTRAMUSCULAR | Status: DC | PRN

## 2023-04-03 MED ORDER — MAGNESIUM SULFATE IN D5W 1-5 GM/100ML-% IV SOLN
1.0000 g | Freq: Once | INTRAVENOUS | Status: AC
  Administered 2023-04-03: 1 g via INTRAVENOUS
  Filled 2023-04-03: qty 100

## 2023-04-03 MED ORDER — SODIUM CHLORIDE 0.9 % IV SOLN
1.0000 g | INTRAVENOUS | Status: AC
  Administered 2023-04-03 – 2023-04-05 (×3): 1 g via INTRAVENOUS
  Filled 2023-04-03 (×3): qty 10

## 2023-04-03 MED ORDER — HEPARIN SODIUM (PORCINE) 5000 UNIT/ML IJ SOLN
5000.0000 [IU] | Freq: Three times a day (TID) | INTRAMUSCULAR | Status: DC
  Administered 2023-04-03 – 2023-04-06 (×7): 5000 [IU] via SUBCUTANEOUS
  Filled 2023-04-03 (×8): qty 1

## 2023-04-03 MED ORDER — ACETAMINOPHEN 325 MG PO TABS
650.0000 mg | ORAL_TABLET | Freq: Four times a day (QID) | ORAL | Status: DC | PRN
  Administered 2023-04-04: 650 mg via ORAL
  Filled 2023-04-03: qty 2

## 2023-04-03 MED ORDER — ACETAMINOPHEN 650 MG RE SUPP: 650.0000 mg | Freq: Four times a day (QID) | RECTAL | Status: DC | PRN

## 2023-04-03 MED ORDER — ACETAMINOPHEN 650 MG RE SUPP: 650.0000 mg | Freq: Once | RECTAL | Status: DC

## 2023-04-03 MED ORDER — ACETAMINOPHEN 325 MG PO TABS: 650.0000 mg | ORAL_TABLET | Freq: Once | ORAL | Status: DC

## 2023-04-03 MED ORDER — NICOTINE 21 MG/24HR TD PT24
21.0000 mg | MEDICATED_PATCH | Freq: Every day | TRANSDERMAL | Status: DC
  Administered 2023-04-04 – 2023-04-05 (×2): 21 mg via TRANSDERMAL
  Filled 2023-04-03 (×3): qty 1

## 2023-04-03 MED ORDER — LACTATED RINGERS IV SOLN: INTRAVENOUS | Status: DC

## 2023-04-03 MED ORDER — PROMETHAZINE HCL 12.5 MG PO TABS: 12.5000 mg | ORAL_TABLET | Freq: Four times a day (QID) | ORAL | Status: DC | PRN

## 2023-04-03 MED ORDER — LACTATED RINGERS IV BOLUS
1000.0000 mL | Freq: Once | INTRAVENOUS | Status: AC
  Administered 2023-04-03: 1000 mL via INTRAVENOUS

## 2023-04-03 MED ORDER — HYDRALAZINE HCL 20 MG/ML IJ SOLN: 10.0000 mg | Freq: Four times a day (QID) | INTRAMUSCULAR | Status: DC | PRN

## 2023-04-03 MED ORDER — HALOPERIDOL LACTATE 5 MG/ML IJ SOLN: 2.0000 mg | Freq: Four times a day (QID) | INTRAMUSCULAR | Status: DC | PRN

## 2023-04-03 MED ORDER — POTASSIUM CHLORIDE CRYS ER 20 MEQ PO TBCR
40.0000 meq | EXTENDED_RELEASE_TABLET | Freq: Once | ORAL | Status: AC
  Administered 2023-04-03: 40 meq via ORAL
  Filled 2023-04-03: qty 2

## 2023-04-03 MED ORDER — POTASSIUM CHLORIDE 10 MEQ/100ML IV SOLN
10.0000 meq | INTRAVENOUS | Status: AC
  Administered 2023-04-03: 10 meq via INTRAVENOUS
  Filled 2023-04-03 (×3): qty 100

## 2023-04-03 MED ORDER — LORAZEPAM 2 MG/ML IJ SOLN: 2.0000 mg | Freq: Once | INTRAMUSCULAR | Status: AC

## 2023-04-03 MED ORDER — AMLODIPINE BESYLATE 10 MG PO TABS
10.0000 mg | ORAL_TABLET | Freq: Every day | ORAL | Status: DC
  Administered 2023-04-04 – 2023-04-06 (×3): 10 mg via ORAL
  Filled 2023-04-03 (×3): qty 1

## 2023-04-03 MED ORDER — POTASSIUM CHLORIDE 2 MEQ/ML IV SOLN
INTRAVENOUS | Status: AC
  Filled 2023-04-03 (×2): qty 1000

## 2023-04-03 MED ORDER — LORAZEPAM 2 MG/ML IJ SOLN
INTRAMUSCULAR | Status: AC
  Administered 2023-04-03: 2 mg via INTRAMUSCULAR
  Filled 2023-04-03: qty 1

## 2023-04-03 MED ORDER — STERILE WATER FOR INJECTION IJ SOLN
INTRAMUSCULAR | Status: AC
  Administered 2023-04-03: 10 mL
  Filled 2023-04-03: qty 10

## 2023-04-03 MED ORDER — ZIPRASIDONE MESYLATE 20 MG IM SOLR
20.0000 mg | Freq: Once | INTRAMUSCULAR | Status: AC
  Administered 2023-04-03: 20 mg via INTRAMUSCULAR
  Filled 2023-04-03: qty 20

## 2023-04-03 NOTE — ED Notes (Signed)
Tech called pt. To be triaged. Pt. Was escorted by 4 [employer] officers. Got pt over to Triage 1 and attempted to get vitals. BP was obtained but pt refused to keep pulse ox on finger. Pt. Refused temp orally. Pt. Still combative in triage with [employer]

## 2023-04-03 NOTE — ED Notes (Signed)
ED TO INPATIENT HANDOFF REPORT  ED Nurse Name and Phone #: chris [phone]  S Name/Age/Gender Kayla Richard 32 y.o. female Room/Bed: TRAAC/TRAAC  Code Status   Code Status: Full Code  Home/SNF/Other Home unreponsive Is this baseline? No   Triage Complete: Triage complete  Chief Complaint Toxic encephalopathy [G92.9]  Triage Note Pt arrives with multiple [employer] officers who state that she has called asking for help multiple times since 0600 this morning. Each time pt has moments of lucidity and then refuses help, but has been extremely paranoid with them. Family has taken out IVC paperwork. [employer] reports that pt has stated multiple people are trying to hurt her. Pt difficult to triage due to not cooperating.    Allergies Allergies  Allergen Reactions   Hydrocodone Nausea And Vomiting   Bee Venom    Mango Flavor Hives    Level of Care/Admitting Diagnosis ED Disposition     ED Disposition  Admit   Condition  --   Comment  Hospital Area: [facility] [[id number]]  Level of Care: Telemetry Medical [104]  May admit patient to [facility] or [facility] if equivalent level of care is available:: No  Covid Evaluation: Asymptomatic - no recent exposure (last 10 days) testing not required  Diagnosis: Toxic encephalopathy [349.82.ICD-9-CM]  Admitting Physician: Mee Hives  Attending Physician: Leroy Sea [[id number]]  Bed request comments: 5W  Certification:: I certify there are rare and unusual circumstances requiring inpatient admission          B Medical/Surgery History Past Medical History:  Diagnosis Date   Chronic hypertension    Cyst of breast    Neuropathy    Preterm labor    Past Surgical History:  Procedure Laterality Date   CERVICAL CERCLAGE N/A 04/15/2015   Procedure: CERCLAGE CERVICAL;  Surgeon: Jaymes Graff, MD;  Location: [facility];  Service: Gynecology;  Laterality: N/A;     A IV Location/Drains/Wounds Patient  Lines/Drains/Airways Status     Active Line/Drains/Airways     Name Placement date Placement time Site Days   Peripheral IV 04/03/23 20 G Anterior;Proximal;Right Forearm 04/03/23  1304  Forearm  less than 1            Intake/Output Last 24 hours No intake or output data in the 24 hours ending 04/03/23 1805  Labs/Imaging Results for orders placed or performed during the hospital encounter of 04/03/23 (from the past 48 hour(s))  Comprehensive metabolic panel     Status: Abnormal   Collection Time: 04/03/23 12:22 PM  Result Value Ref Range   Sodium 141 135 - 145 mmol/L   Potassium 2.6 (LL) 3.5 - 5.1 mmol/L    Comment: CRITICAL RESULT CALLED TO, READ BACK BY AND VERIFIED WITH S.ROWLAND RN 1417 04/03/23 MCCORMICK K   Chloride 100 98 - 111 mmol/L   CO2 22 22 - 32 mmol/L   Glucose, Bld 143 (H) 70 - 99 mg/dL    Comment: Glucose reference range applies only to samples taken after fasting for at least 8 hours.   BUN 15 6 - 20 mg/dL   Creatinine, Ser 3.47 (H) 0.44 - 1.00 mg/dL   Calcium 42.5 8.9 - 95.6 mg/dL   Total Protein 9.2 (H) 6.5 - 8.1 g/dL   Albumin 4.8 3.5 - 5.0 g/dL   AST 30 15 - 41 U/L   ALT 20 0 - 44 U/L   Alkaline Phosphatase 62 38 - 126 U/L   Total Bilirubin 1.0 0.3 -  1.2 mg/dL   GFR, Estimated 30 (L) >60 mL/min    Comment: (NOTE) Calculated using the CKD-EPI Creatinine Equation (2021)    Anion gap 19 (H) 5 - 15    Comment: Performed at [facility], [address] [place], [place] [address]  Ethanol     Status: None   Collection Time: 04/03/23 12:22 PM  Result Value Ref Range   Alcohol, Ethyl (B) <10 <10 mg/dL    Comment: (NOTE) Lowest detectable limit for serum alcohol is 10 mg/dL.  For medical purposes only. Performed at [facility], [address], [place], [place] [address]   CBC with Diff     Status: Abnormal   Collection Time: 04/03/23 12:22 PM  Result Value Ref Range   WBC 21.8 (H) 4.0 - 10.5 K/uL   RBC 4.86 3.87 - 5.11 MIL/uL    Hemoglobin 13.8 12.0 - 15.0 g/dL   HCT 09.8 11.9 - 14.7 %   MCV 87.2 80.0 - 100.0 fL   MCH 28.4 26.0 - 34.0 pg   MCHC 32.5 30.0 - 36.0 g/dL   RDW 82.9 56.2 - 13.0 %   Platelets 276 150 - 400 K/uL   nRBC 0.0 0.0 - 0.2 %   Neutrophils Relative % 84 %   Neutro Abs 18.1 (H) 1.7 - 7.7 K/uL   Lymphocytes Relative 10 %   Lymphs Abs 2.3 0.7 - 4.0 K/uL   Monocytes Relative 5 %   Monocytes Absolute 1.2 (H) 0.1 - 1.0 K/uL   Eosinophils Relative 0 %   Eosinophils Absolute 0.0 0.0 - 0.5 K/uL   Basophils Relative 0 %   Basophils Absolute 0.1 0.0 - 0.1 K/uL   Immature Granulocytes 1 %   Abs Immature Granulocytes 0.16 (H) 0.00 - 0.07 K/uL    Comment: Performed at [facility], [address] [place], [place] [address]  hCG, serum, qualitative     Status: None   Collection Time: 04/03/23 12:22 PM  Result Value Ref Range   Preg, Serum NEGATIVE NEGATIVE    Comment:        THE SENSITIVITY OF THIS METHODOLOGY IS >10 mIU/mL. Performed at [facility], [address] [place], [place] [address]   Urine rapid drug screen (hosp performed)     Status: Abnormal   Collection Time: 04/03/23  3:31 PM  Result Value Ref Range   Opiates NONE DETECTED NONE DETECTED   Cocaine POSITIVE (A) NONE DETECTED   Benzodiazepines NONE DETECTED NONE DETECTED   Amphetamines POSITIVE (A) NONE DETECTED   Tetrahydrocannabinol POSITIVE (A) NONE DETECTED   Barbiturates NONE DETECTED NONE DETECTED    Comment: (NOTE) DRUG SCREEN FOR MEDICAL PURPOSES ONLY.  IF CONFIRMATION IS NEEDED FOR ANY PURPOSE, NOTIFY LAB WITHIN 5 DAYS.  LOWEST DETECTABLE LIMITS FOR URINE DRUG SCREEN Drug Class                     Cutoff (ng/mL) Amphetamine and metabolites    1000 Barbiturate and metabolites    200 Benzodiazepine                 200 Opiates and metabolites        300 Cocaine and metabolites        300 THC                            50 Performed at [facility],  [address] [place], [place] [address]    Urinalysis, Routine w reflex microscopic -Urine, Catheterized     Status: Abnormal   Collection Time: 04/03/23  3:32 PM  Result Value Ref Range   Color, Urine AMBER (A) YELLOW    Comment: BIOCHEMICALS MAY BE AFFECTED BY COLOR   APPearance CLOUDY (A) CLEAR   Specific Gravity, Urine 1.029 1.005 - 1.030   pH 5.0 5.0 - 8.0   Glucose, UA NEGATIVE NEGATIVE mg/dL   Hgb urine dipstick NEGATIVE NEGATIVE   Bilirubin Urine NEGATIVE NEGATIVE   Ketones, ur NEGATIVE NEGATIVE mg/dL   Protein, ur >=536 (A) NEGATIVE mg/dL   Nitrite NEGATIVE NEGATIVE   Leukocytes,Ua TRACE (A) NEGATIVE   RBC / HPF 0-5 0 - 5 RBC/hpf   WBC, UA 6-10 0 - 5 WBC/hpf   Bacteria, UA FEW (A) NONE SEEN   Squamous Epithelial / HPF 0-5 0 - 5 /HPF   Mucus PRESENT    Hyaline Casts, UA PRESENT     Comment: Performed at [facility], [address], [place], [place] [address]   CT Head Wo Contrast  Result Date: 04/03/2023 CLINICAL DATA:  Mental status change with unknown cause. EXAM: CT HEAD WITHOUT CONTRAST TECHNIQUE: Contiguous axial images were obtained from the base of the skull through the vertex without intravenous contrast. RADIATION DOSE REDUCTION: This exam was performed according to the departmental dose-optimization program which includes automated exposure control, adjustment of the mA and/or kV according to patient size and/or use of iterative reconstruction technique. COMPARISON:  03/30/2019 FINDINGS: Brain: Low-density appearance at the left cerebellum is best attributed to streak artifact given its band like shape and orientation on reformats. No evidence of acute infarct, hemorrhage, hydrocephalus, or collection. Vascular: No hyperdense vessel or unexpected calcification. Skull: Normal. Negative for fracture or focal lesion. Sinuses/Orbits: No acute finding. Other: Extensive artifact from patient's earrings. IMPRESSION: Degraded visualization from patient's earrings. No acute finding when allowing for associated  streak artifact. Electronically Signed   By: Tiburcio Pea M.D.   On: 04/03/2023 15:37   DG Chest Port 1 View  Result Date: 04/03/2023 CLINICAL DATA:  altered mental status EXAM: PORTABLE CHEST 1 VIEW COMPARISON:  07/26/2022. FINDINGS: Low lung volume. Mild central pulmonary vascular congestion, most likely accentuated by low lung volume. Bilateral lung fields are otherwise clear. Bilateral costophrenic angles are clear. Stable mildly enlarged cardio-mediastinal silhouette. No acute osseous abnormalities. The soft tissues are within normal limits. IMPRESSION: *Mild central pulmonary vascular congestion, most likely accentuated by low lung volume. Electronically Signed   By: Jules Schick M.D.   On: 04/03/2023 15:08    Pending Labs Unresulted Labs (From admission, onward)     Start     Ordered   04/04/23 0500  CBC with Differential/Platelet  Daily,   R     Question:  Specimen collection method  Answer:  Lab=Lab collect   04/03/23 1605   04/04/23 0500  Brain natriuretic peptide  Daily,   R     Question:  Specimen collection method  Answer:  Lab=Lab collect   04/03/23 1605   04/04/23 0500  Magnesium  Daily,   R     Question:  Specimen collection method  Answer:  Lab=Lab collect   04/03/23 1605   04/04/23 0500  Comprehensive metabolic panel  Daily,   R     Question:  Specimen collection method  Answer:  Lab=Lab collect   04/03/23 1605   04/03/23 1628  HIV Antibody (routine testing  w rflx)  (HIV Antibody (Routine testing w reflex) panel)  Once,   R        04/03/23 1628   04/03/23 1628  CBC  (heparin)  Once,   R       Comments: Baseline for heparin therapy IF NOT ALREADY DRAWN.  Notify MD if PLT < 100 K.    04/03/23 1628   04/03/23 1628  Creatinine, serum  (heparin)  Once,   R       Comments: Baseline for heparin therapy IF NOT ALREADY DRAWN.    04/03/23 1628            Vitals/Pain Today's Vitals   04/03/23 1445 04/03/23 1456 04/03/23 1500 04/03/23 1515  BP: (!) 148/70  (!)  143/65 (!) 144/68  Pulse: (!) 103  (!) 102 (!) 101  Resp: (!) 24  (!) 23 (!) 22  Temp:  100 F (37.8 C)    TempSrc:  Rectal    SpO2: 97%  97% 97%    Isolation Precautions No active isolations  Medications Medications  acetaminophen (TYLENOL) tablet 650 mg (has no administration in time range)  acetaminophen (TYLENOL) suppository 650 mg (has no administration in time range)  potassium chloride 10 mEq in 100 mL IVPB (10 mEq Intravenous New Bag/Given 04/03/23 1653)  potassium chloride SA (KLOR-CON M) CR tablet 40 mEq (has no administration in time range)  lactated ringers 1,000 mL with potassium chloride 40 mEq infusion (has no administration in time range)  haloperidol lactate (HALDOL) injection 2 mg (has no administration in time range)  amLODipine (NORVASC) tablet 10 mg (has no administration in time range)  nicotine (NICODERM CQ - dosed in mg/24 hours) patch 21 mg (has no administration in time range)  hydrALAZINE (APRESOLINE) injection 10 mg (has no administration in time range)  LORazepam (ATIVAN) injection 1 mg (has no administration in time range)  heparin injection 5,000 Units (has no administration in time range)  acetaminophen (TYLENOL) tablet 650 mg (has no administration in time range)    Or  acetaminophen (TYLENOL) suppository 650 mg (has no administration in time range)  promethazine (PHENERGAN) tablet 12.5 mg (has no administration in time range)  LORazepam (ATIVAN) injection 2 mg (2 mg Intramuscular Given 04/03/23 1230)  ziprasidone (GEODON) injection 20 mg (20 mg Intramuscular Given 04/03/23 1237)  sterile water (preservative free) injection (10 mLs  Given 04/03/23 1239)  lactated ringers bolus 1,000 mL (0 mLs Intravenous Stopped 04/03/23 1628)  magnesium sulfate IVPB 1 g 100 mL (1 g Intravenous New Bag/Given 04/03/23 1649)    Mobility non-ambulatory     Focused Assessments Cardiac Assessment Handoff:    Lab Results  Component Value Date   CKTOTAL 5,830 (H)  04/03/2019   No results found for: "DDIMER" Does the Patient currently have chest pain? No    R Recommendations: See Admitting Provider Note  Report given to:   Additional Notes:

## 2023-04-03 NOTE — ED Notes (Signed)
Security and RN applied soft restraints to pt bilateral wrist. Pt will not follow commands and repeatedly yelling a prayer and stating, "don't hurt me." Staff and RN have attempted several times to redirect pt but pt u/t comply/

## 2023-04-03 NOTE — ED Notes (Signed)
All IVC documentation accepted by [facility].  IVC Case File # [id number], dated 04/03/23, exp. 04/10/23. IVC docs in orange zone.

## 2023-04-03 NOTE — ED Notes (Signed)
Tp c-t

## 2023-04-03 NOTE — ED Triage Notes (Signed)
Pt arrives with multiple [employer] officers who state that she has called asking for help multiple times since 0600 this morning. Each time pt has moments of lucidity and then refuses help, but has been extremely paranoid with them. Family has taken out IVC paperwork. [employer] reports that pt has stated multiple people are trying to hurt her. Pt difficult to triage due to not cooperating.

## 2023-04-03 NOTE — ED Notes (Signed)
1600 wirst restraints removed

## 2023-04-03 NOTE — ED Notes (Signed)
Wrist resrraints removed per order dr pluinkett at 1600

## 2023-04-03 NOTE — H&P (Addendum)
TRH H&P   Patient Demographics:    Kayla Richard, is a 32 y.o. female  MRN: [id number]   DOB - 1991/09/11  Admit Date - 04/03/2023  Outpatient Primary MD for the patient is Patient, No Pcp Per  Patient coming from: Home, [place]  Chief Complaint  Patient presents with   Paranoid   IVC      HPI:    Kayla Richard  is a 32 y.o. female, with H/O Obesity, HTN, ? Drug abuse, lives with her mother, all history obtained from her mother over the phone as patient is somnolent after she received Geodon in the ER.  According to the mother patient came from work yesterday and was looking anxious, this morning when she woke up she continued to be anxious and acting strangely, subsequently she was found to be very combative and talking about things that did not make sense, she was brought to the ER where she continued to be quite combative, she could not provide any history, ER physician obtained an IVC on her gave her Geodon and she was sedated.  Initial workup in the hospital suggest dehydration, AKI and hyponatremia, urine drug screen positive for cocaine, amphetamine and marijuana.  Patient is somnolent and cannot provide a thorough interview, I was called to admit the patient.    Review of systems:      A full 10 point Review of Systems was done, except as stated above, all other Review of Systems were negative.   With Past History of the following :    Past Medical History:  Diagnosis Date   Chronic hypertension    Cyst of breast    Neuropathy    Preterm labor       Past Surgical History:  Procedure Laterality Date   CERVICAL CERCLAGE N/A 04/15/2015   Procedure: CERCLAGE CERVICAL;  Surgeon: Jaymes Graff, MD;   Location: [facility];  Service: Gynecology;  Laterality: N/A;      Social History:     Social History   Tobacco Use   Smoking status: Every Day    Current packs/day: 0.00    Average packs/day: 0.3 packs/day for 6.0 years (1.5 ttl pk-yrs)    Types: Cigarettes    Start date: 10/28/2010    Last attempt to quit: 10/28/2016    Years since quitting: 6.4  Smokeless tobacco: Current  Substance Use Topics   Alcohol use: No        Family History :     Family History  Problem Relation Age of Onset   Hypertension Mother    Diabetes Father        Home Medications:   Prior to Admission medications   Medication Sig Start Date End Date Taking? Authorizing Provider  acetaminophen (TYLENOL) 325 MG tablet Take 2 tablets (650 mg total) by mouth every 6 (six) hours as needed for mild pain or moderate pain (or Fever >/= 101). 04/03/19   Jerald Kief, MD  amLODipine (NORVASC) 10 MG tablet Take 1 tablet (10 mg total) by mouth daily. 04/04/19 05/04/19  Jerald Kief, MD  gabapentin (NEURONTIN) 100 MG capsule Take 1 capsule (100 mg total) by mouth 3 (three) times daily. 05/06/19 06/05/19  Wynetta Fines, MD  hydrALAZINE (APRESOLINE) 25 MG tablet Take 1 tablet (25 mg total) by mouth every 8 (eight) hours. 04/03/19 05/03/19  Jerald Kief, MD  metoprolol tartrate (LOPRESSOR) 25 MG tablet Take 1 tablet (25 mg total) by mouth 2 (two) times daily. 04/03/19 05/03/19  Jerald Kief, MD  nicotine (NICODERM CQ - DOSED IN MG/24 HOURS) 21 mg/24hr patch Place 1 patch (21 mg total) onto the skin daily. 04/04/19   Jerald Kief, MD  ondansetron (ZOFRAN-ODT) 4 MG disintegrating tablet Take 1 tablet (4 mg total) by mouth every 8 (eight) hours as needed for nausea or vomiting. 07/26/22   Roemhildt, Lorin T, PA-C  oseltamivir (TAMIFLU) 75 MG capsule Take 1 capsule (75 mg total) by mouth every 12 (twelve) hours. 07/26/22   Roemhildt, Lorin T, PA-C  potassium chloride SA (KLOR-CON M) 20 MEQ tablet Take 1 tablet (20 mEq  total) by mouth 2 (two) times daily for 4 days. 07/26/22 07/30/22  Roemhildt, Lorin T, PA-C  traMADol (ULTRAM) 50 MG tablet Take 1 tablet (50 mg total) by mouth every 6 (six) hours as needed. 04/03/19   Jerald Kief, MD     Allergies:     Allergies  Allergen Reactions   Hydrocodone Nausea And Vomiting   Bee Venom    Mango Flavor Hives     Physical Exam:   Vitals  Blood pressure (!) 144/68, pulse (!) 101, temperature 100 F (37.8 C), temperature source Rectal, resp. rate (!) 22, SpO2 97%, unknown if currently breastfeeding.   1. General - middle aged obese AA female, in bed, somnolent, will answer a few basic questions, in no distress  2. Psych unobtainable,   3. No F.N deficits, ALL C.Nerves Intact, moving all 4 extremities by self and to painful stimuli , Plantars down going.  4. Ears and Eyes appear Normal, Conjunctivae clear, PERRLA. Moist Oral Mucosa.  5. Supple Neck, No JVD, No cervical lymphadenopathy appriciated, No Carotid Bruits.  6. Symmetrical Chest wall movement, Good air movement bilaterally, CTAB.  7. RRR, No Gallops, Rubs or Murmurs, No Parasternal Heave.  8. Positive Bowel Sounds, Abdomen Soft, No tenderness, No organomegaly appriciated,No rebound -guarding or rigidity.  9.  No Cyanosis, Normal Skin Turgor, No Skin Rash or Bruise.  10. Good muscle tone,  joints appear normal , no effusions, Normal ROM.  11. No Palpable Lymph Nodes in Neck or Axillae      Data Review:   Recent Labs  Lab 04/03/23 1222  WBC 21.8*  HGB 13.8  HCT 42.4  PLT 276  MCV 87.2  MCH 28.4  MCHC 32.5  RDW 13.1  LYMPHSABS 2.3  MONOABS 1.2*  EOSABS 0.0  BASOSABS 0.1    Recent Labs  Lab 04/03/23 1222  NA 141  K 2.6*  CL 100  CO2 22  ANIONGAP 19*  GLUCOSE 143*  BUN 15  CREATININE 2.19*  AST 30  ALT 20  ALKPHOS 62  BILITOT 1.0  ALBUMIN 4.8  CALCIUM 10.0    No results found for: "CHOL", "HDL", "LDLCALC", "LDLDIRECT", "TRIG", "CHOLHDL"  Recent Labs   Lab 04/03/23 1222  CALCIUM 10.0    Recent Labs  Lab 04/03/23 1222  WBC 21.8*  PLT 276  CREATININE 2.19*    Urinalysis    Component Value Date/Time   COLORURINE AMBER (A) 04/03/2023 1532   APPEARANCEUR CLOUDY (A) 04/03/2023 1532   LABSPEC 1.029 04/03/2023 1532   PHURINE 5.0 04/03/2023 1532   GLUCOSEU NEGATIVE 04/03/2023 1532   HGBUR NEGATIVE 04/03/2023 1532   BILIRUBINUR NEGATIVE 04/03/2023 1532   KETONESUR NEGATIVE 04/03/2023 1532   PROTEINUR >=300 (A) 04/03/2023 1532   UROBILINOGEN 0.2 11/28/2016 1304   NITRITE NEGATIVE 04/03/2023 1532   LEUKOCYTESUR TRACE (A) 04/03/2023 1532      Imaging Results:    CT Head Wo Contrast  Result Date: 04/03/2023 CLINICAL DATA:  Mental status change with unknown cause. EXAM: CT HEAD WITHOUT CONTRAST TECHNIQUE: Contiguous axial images were obtained from the base of the skull through the vertex without intravenous contrast. RADIATION DOSE REDUCTION: This exam was performed according to the departmental dose-optimization program which includes automated exposure control, adjustment of the mA and/or kV according to patient size and/or use of iterative reconstruction technique. COMPARISON:  03/30/2019 FINDINGS: Brain: Low-density appearance at the left cerebellum is best attributed to streak artifact given its band like shape and orientation on reformats. No evidence of acute infarct, hemorrhage, hydrocephalus, or collection. Vascular: No hyperdense vessel or unexpected calcification. Skull: Normal. Negative for fracture or focal lesion. Sinuses/Orbits: No acute finding. Other: Extensive artifact from patient's earrings. IMPRESSION: Degraded visualization from patient's earrings. No acute finding when allowing for associated streak artifact. Electronically Signed   By: Tiburcio Pea M.D.   On: 04/03/2023 15:37   DG Chest Port 1 View  Result Date: 04/03/2023 CLINICAL DATA:  altered mental status EXAM: PORTABLE CHEST 1 VIEW COMPARISON:  07/26/2022.  FINDINGS: Low lung volume. Mild central pulmonary vascular congestion, most likely accentuated by low lung volume. Bilateral lung fields are otherwise clear. Bilateral costophrenic angles are clear. Stable mildly enlarged cardio-mediastinal silhouette. No acute osseous abnormalities. The soft tissues are within normal limits. IMPRESSION: *Mild central pulmonary vascular congestion, most likely accentuated by low lung volume. Electronically Signed   By: Jules Schick M.D.   On: 04/03/2023 15:08    My personal review of EKG: Rhythm NSR, QTc 562 ms   Assessment & Plan:    1.  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  Urine drug screen positive for cocaine, amphetamine and marijuana.  Also history of smoking.  At this time she has been IVC by the ER physician, she will be admitted to the hospital with one-to-one sitter, IV fluids, as needed Ativan and low-dose IM Haldol, will use Haldol only if no other options left as her QTc is  prolonged.  She has supple neck, head CT unremarkable, no focal deficits on limited neuroexam.  Once mentation improves and medically stable psych consult.  2.  Dehydration, AKI and hypokalemia.  Replace potassium hydrate with IV fluids and monitor.  She does have significant proteinuria but  likely due to severe stress and dehydration, repeat UA in 24 hours.  3.  Prolonged QTc.  Likely due to severe hypokalemia, replace potassium, hydrate with IV fluids, 1 g of magnesium.  Monitor on telemetry.  4.  Hypertension.  For now as needed IV hydralazine and scheduled home dose Norvasc.  5.  Morbid obesity.  Follow-up with PCP.  6. UTI - Rocephin x 3, follow culture.  7.Leukocytosis - likely from #6, monitor Inflammatory markers, repeat CXR in am, temp curve.    DVT Prophylaxis Heparin   AM Labs Ordered, also please review Full Orders  Family Communication: Admission, patients condition and plan of care including tests being ordered have been discussed  with the patient and mother Dois Davenport [phone]  who indicate understanding and agree with the plan and Code Status.  Code Status Full  Likely DC to  TBD  Condition GUARDED     Consults called: None    Admission status: inpt    Time spent in minutes : 45  Signature  -    Susa Raring M.D on 04/03/2023 at 4:16 PM   -  To page go to [email or web address]

## 2023-04-03 NOTE — ED Notes (Signed)
Iv rt  upper arm not running

## 2023-04-03 NOTE — ED Notes (Signed)
Pt jewelry placed in safe. Envelope # [id number]

## 2023-04-03 NOTE — ED Notes (Signed)
Pt O2 89-91% RN placed pt on 2L nasal canula. Pt O2 97%

## 2023-04-03 NOTE — ED Notes (Signed)
E-filed IVC docs and Dr Ray's first exam. Received acknowledgment, waiting on approval  and case number from [facility]. Documentation in orange zone.

## 2023-04-03 NOTE — ED Provider Notes (Addendum)
Head CT is negative for any acute findings.  On reevaluation patient is sleeping with stable vital signs.  Restraints were removed.  Will admit for AKI, excited delirium, hypokalemia.  Bladder scan 152. Gwyneth Sprout, MD 04/03/23 1556    Gwyneth Sprout, MD 04/03/23 1622

## 2023-04-03 NOTE — ED Notes (Signed)
Iv not working and the pt is becoming more alert

## 2023-04-03 NOTE — ED Notes (Signed)
RN and NT assisted with rectal temp and I/O

## 2023-04-03 NOTE — ED Notes (Signed)
Pt is standing in room stating, "Please don't kill me, please don't kill me." RN and staff attempting to redirect pt but pt will not follow commands. [employer] and security helped assist pt in bed. RN and staff steadily reassured pt she is safe and help is going to be given.

## 2023-04-03 NOTE — ED Notes (Signed)
RN removed pt 6 yellow rings and 1 yellow necklace with a pineapple charm and provided to security.

## 2023-04-03 NOTE — ED Notes (Signed)
Pt mother name is Dois Davenport [phone]

## 2023-04-03 NOTE — ED Provider Notes (Signed)
[place] EMERGENCY DEPARTMENT AT [facility] Provider Note   CSN: [id number] Arrival date & time: 04/03/23  1113     History  Chief Complaint  Patient presents with  . Paranoid  . IVC    Kayla Richard is a 32 y.o. female.  HPI 32 year old female no reported significant past medical history presents today with agitation, paranoia, confusion.  Patient presents with [employer] who states that she was called multiple times today asking for help.  Family had taken out IVC paperwork.  Patient is scared and states that people are trying to hurt her.  Family reports that she has been drinking alcohol and using cocaine.  They do not report any prior similar episodes of confusion, paranoia, or psychosis in the past.    Home Medications Prior to Admission medications   Medication Sig Start Date End Date Taking? Authorizing Provider  acetaminophen (TYLENOL) 325 MG tablet Take 2 tablets (650 mg total) by mouth every 6 (six) hours as needed for mild pain or moderate pain (or Fever >/= 101). 04/03/19   Jerald Kief, MD  amLODipine (NORVASC) 10 MG tablet Take 1 tablet (10 mg total) by mouth daily. 04/04/19 05/04/19  Jerald Kief, MD  gabapentin (NEURONTIN) 100 MG capsule Take 1 capsule (100 mg total) by mouth 3 (three) times daily. 05/06/19 06/05/19  Wynetta Fines, MD  hydrALAZINE (APRESOLINE) 25 MG tablet Take 1 tablet (25 mg total) by mouth every 8 (eight) hours. 04/03/19 05/03/19  Jerald Kief, MD  metoprolol tartrate (LOPRESSOR) 25 MG tablet Take 1 tablet (25 mg total) by mouth 2 (two) times daily. 04/03/19 05/03/19  Jerald Kief, MD  nicotine (NICODERM CQ - DOSED IN MG/24 HOURS) 21 mg/24hr patch Place 1 patch (21 mg total) onto the skin daily. 04/04/19   Jerald Kief, MD  ondansetron (ZOFRAN-ODT) 4 MG disintegrating tablet Take 1 tablet (4 mg total) by mouth every 8 (eight) hours as needed for nausea or vomiting. 07/26/22   Roemhildt, Lorin T, PA-C  oseltamivir (TAMIFLU) 75 MG  capsule Take 1 capsule (75 mg total) by mouth every 12 (twelve) hours. 07/26/22   Roemhildt, Lorin T, PA-C  potassium chloride SA (KLOR-CON M) 20 MEQ tablet Take 1 tablet (20 mEq total) by mouth 2 (two) times daily for 4 days. 07/26/22 07/30/22  Roemhildt, Lorin T, PA-C  traMADol (ULTRAM) 50 MG tablet Take 1 tablet (50 mg total) by mouth every 6 (six) hours as needed. 04/03/19   Jerald Kief, MD      Allergies    Hydrocodone, Bee venom, and Mango flavor    Review of Systems   Review of Systems  Physical Exam Updated Vital Signs BP (!) 144/68   Pulse (!) 101   Temp 100 F (37.8 C) (Rectal)   Resp (!) 22   SpO2 97%  Physical Exam Vitals reviewed.  Constitutional:      General: She is in acute distress.  HENT:     Head: Normocephalic.     Right Ear: External ear normal.     Left Ear: External ear normal.     Nose: Nose normal.     Mouth/Throat:     Pharynx: Oropharynx is clear.  Eyes:     Extraocular Movements: Extraocular movements intact.     Pupils: Pupils are equal, round, and reactive to light.  Cardiovascular:     Rate and Rhythm: Tachycardia present.  Pulmonary:     Effort: Pulmonary effort is normal.  Abdominal:     Palpations: Abdomen is soft.  Musculoskeletal:        General: Normal range of motion.     Cervical back: Normal range of motion.     Comments: Soles of feet with some induration question burn  Neurological:     General: No focal deficit present.     Mental Status: She is alert.  Psychiatric:        Attention and Perception: She is inattentive.        Mood and Affect: Mood is anxious. Affect is tearful and inappropriate.        Behavior: Behavior is agitated, hyperactive and combative.        Thought Content: Thought content is paranoid.     ED Results / Procedures / Treatments   Labs (all labs ordered are listed, but only abnormal results are displayed) Labs Reviewed  COMPREHENSIVE METABOLIC PANEL - Abnormal; Notable for the following  components:      Result Value   Potassium 2.6 (*)    Glucose, Bld 143 (*)    Creatinine, Ser 2.19 (*)    Total Protein 9.2 (*)    GFR, Estimated 30 (*)    Anion gap 19 (*)    All other components within normal limits  CBC WITH DIFFERENTIAL/PLATELET - Abnormal; Notable for the following components:   WBC 21.8 (*)    Neutro Abs 18.1 (*)    Monocytes Absolute 1.2 (*)    Abs Immature Granulocytes 0.16 (*)    All other components within normal limits  ETHANOL  HCG, SERUM, QUALITATIVE  RAPID URINE DRUG SCREEN, HOSP PERFORMED  URINALYSIS, ROUTINE W REFLEX MICROSCOPIC    EKG None  Radiology DG Chest Port 1 View  Result Date: 04/03/2023 CLINICAL DATA:  altered mental status EXAM: PORTABLE CHEST 1 VIEW COMPARISON:  07/26/2022. FINDINGS: Low lung volume. Mild central pulmonary vascular congestion, most likely accentuated by low lung volume. Bilateral lung fields are otherwise clear. Bilateral costophrenic angles are clear. Stable mildly enlarged cardio-mediastinal silhouette. No acute osseous abnormalities. The soft tissues are within normal limits. IMPRESSION: *Mild central pulmonary vascular congestion, most likely accentuated by low lung volume. Electronically Signed   By: Jules Schick M.D.   On: 04/03/2023 15:08    Procedures Procedures    Medications Ordered in ED Medications  acetaminophen (TYLENOL) tablet 650 mg (has no administration in time range)  acetaminophen (TYLENOL) suppository 650 mg (has no administration in time range)  potassium chloride 10 mEq in 100 mL IVPB (has no administration in time range)  LORazepam (ATIVAN) injection 2 mg (2 mg Intramuscular Given 04/03/23 1230)  ziprasidone (GEODON) injection 20 mg (20 mg Intramuscular Given 04/03/23 1237)  sterile water (preservative free) injection (10 mLs  Given 04/03/23 1239)  lactated ringers bolus 1,000 mL (1,000 mLs Intravenous New Bag/Given 04/03/23 1413)    ED Course/ Medical Decision Making/ A&P Clinical Course  as of 04/03/23 1626  Wed Apr 03, 2023  1404 CBC reviewed interpreted significant for elevated white blood cell count at 21,800 with 84% neutrophils otherwise within normal limits [DR]  1412 Patient reevaluated.  She is much calmer after medications.  Patient continues tachycardic heart rate 115 IV fluids initiated CT T head and chest x-Imelda Dandridge ordered [DR]  1428 Complete metabolic panel is reviewed and interpreted significant for elevated creatinine at 2.19, elevated glucose at 143, hypokalemia potassium is 2.6 [DR]  1504 Chest x-Tammatha Cobb reviewed interpreted significant for some increased markings in right perihilar [DR]  1528  Serum alcohol less than 10 serum alcohol reviewed interpreted less than 10 Pregnancy test reviewed and negative  [DR]  1618 UDS positive for amphetamines, cocaine, and thc [DR]    Clinical Course User Index [DR] Margarita Grizzle, MD                             Medical Decision Making Amount and/or Complexity of Data Reviewed Labs: ordered. Radiology: ordered.  Risk OTC drugs. Prescription drug management.   32 year old female who presents today with confused delirium and paranoia Family member reports recent alcohol and cocaine use No history reported history of mental health disorder Patient initially very tachycardic Tachycardia has resolved with IV fluids, Geodon, and Ativan Patient is much calmer and opens eyes but is not able to respond to questions. She has no focal neurological deficit on exam Awaiting results of CT scan and urine drug screen Patient evaluated with labs and has leukocytosis but no significant fever. Creatinine increased to 2.19 with normal creatinine 9 months ago. Pregnancy test is negative Hypokalemia potassium 2.6 noted.           Final Clinical Impression(s) / ED Diagnoses Final diagnoses:  None    Rx / DC Orders ED Discharge Orders     None         Margarita Grizzle, MD 04/04/23 [phone]

## 2023-04-03 NOTE — ED Notes (Signed)
Pt is yelling, "No, no, no please help me please help." RN asked pt if she is hurt any where. Pt just states,"please don't hurt me."

## 2023-04-04 DIAGNOSIS — F1910 Other psychoactive substance abuse, uncomplicated: Secondary | ICD-10-CM

## 2023-04-04 DIAGNOSIS — I10 Essential (primary) hypertension: Secondary | ICD-10-CM

## 2023-04-04 DIAGNOSIS — N179 Acute kidney failure, unspecified: Secondary | ICD-10-CM

## 2023-04-04 DIAGNOSIS — G929 Unspecified toxic encephalopathy: Secondary | ICD-10-CM

## 2023-04-04 LAB — CBC WITH DIFFERENTIAL/PLATELET
Abs Immature Granulocytes: 0.04 10*3/uL (ref 0.00–0.07)
Basophils Absolute: 0.1 10*3/uL (ref 0.0–0.1)
Basophils Relative: 0 %
Eosinophils Absolute: 0.1 10*3/uL (ref 0.0–0.5)
Eosinophils Relative: 1 %
HCT: 35.1 % — ABNORMAL LOW (ref 36.0–46.0)
Hemoglobin: 11.7 g/dL — ABNORMAL LOW (ref 12.0–15.0)
Immature Granulocytes: 0 %
Lymphocytes Relative: 31 %
Lymphs Abs: 4.5 10*3/uL — ABNORMAL HIGH (ref 0.7–4.0)
MCH: 29.3 pg (ref 26.0–34.0)
MCHC: 33.3 g/dL (ref 30.0–36.0)
MCV: 88 fL (ref 80.0–100.0)
Monocytes Absolute: 1.1 10*3/uL — ABNORMAL HIGH (ref 0.1–1.0)
Monocytes Relative: 7 %
Neutro Abs: 9.1 10*3/uL — ABNORMAL HIGH (ref 1.7–7.7)
Neutrophils Relative %: 61 %
Platelets: 197 10*3/uL (ref 150–400)
RBC: 3.99 MIL/uL (ref 3.87–5.11)
RDW: 13.2 % (ref 11.5–15.5)
WBC: 14.9 10*3/uL — ABNORMAL HIGH (ref 4.0–10.5)
nRBC: 0 % (ref 0.0–0.2)

## 2023-04-04 LAB — COMPREHENSIVE METABOLIC PANEL
ALT: 15 U/L (ref 0–44)
AST: 20 U/L (ref 15–41)
Albumin: 3.5 g/dL (ref 3.5–5.0)
Alkaline Phosphatase: 44 U/L (ref 38–126)
Anion gap: 15 (ref 5–15)
BUN: 13 mg/dL (ref 6–20)
CO2: 23 mmol/L (ref 22–32)
Calcium: 8.5 mg/dL — ABNORMAL LOW (ref 8.9–10.3)
Chloride: 102 mmol/L (ref 98–111)
Creatinine, Ser: 1.21 mg/dL — ABNORMAL HIGH (ref 0.44–1.00)
GFR, Estimated: >60 mL/min (ref >60)
Glucose, Bld: 90 mg/dL (ref 70–99)
Potassium: 3.2 mmol/L — ABNORMAL LOW (ref 3.5–5.1)
Sodium: 140 mmol/L (ref 135–145)
Total Bilirubin: 0.6 mg/dL (ref 0.3–1.2)
Total Protein: 6.9 g/dL (ref 6.5–8.1)

## 2023-04-04 LAB — MAGNESIUM: Magnesium: 1.8 mg/dL (ref 1.7–2.4)

## 2023-04-04 LAB — C-REACTIVE PROTEIN: CRP: 3 mg/dL — ABNORMAL HIGH (ref <1.0)

## 2023-04-04 LAB — BRAIN NATRIURETIC PEPTIDE: B Natriuretic Peptide: 44.2 pg/mL (ref 0.0–100.0)

## 2023-04-04 MED ORDER — HYDRALAZINE HCL 25 MG PO TABS
25.0000 mg | ORAL_TABLET | Freq: Three times a day (TID) | ORAL | Status: DC
  Administered 2023-04-04 – 2023-04-06 (×4): 25 mg via ORAL
  Filled 2023-04-04 (×5): qty 1

## 2023-04-04 MED ORDER — POTASSIUM CHLORIDE CRYS ER 20 MEQ PO TBCR
40.0000 meq | EXTENDED_RELEASE_TABLET | Freq: Once | ORAL | Status: AC
  Administered 2023-04-04: 40 meq via ORAL
  Filled 2023-04-04: qty 2

## 2023-04-04 MED ORDER — ALUM & MAG HYDROXIDE-SIMETH 200-200-20 MG/5ML PO SUSP
30.0000 mL | Freq: Four times a day (QID) | ORAL | Status: DC | PRN
  Administered 2023-04-04: 30 mL via ORAL
  Filled 2023-04-04: qty 30

## 2023-04-04 MED ORDER — POLYETHYLENE GLYCOL 3350 17 G PO PACK
17.0000 g | PACK | Freq: Every day | ORAL | Status: DC
  Filled 2023-04-04 (×3): qty 1

## 2023-04-04 MED ORDER — SENNOSIDES-DOCUSATE SODIUM 8.6-50 MG PO TABS
2.0000 | ORAL_TABLET | Freq: Two times a day (BID) | ORAL | Status: DC
  Administered 2023-04-04 – 2023-04-06 (×4): 2 via ORAL
  Filled 2023-04-04 (×4): qty 2

## 2023-04-04 MED ORDER — PANTOPRAZOLE SODIUM 40 MG PO TBEC
40.0000 mg | DELAYED_RELEASE_TABLET | Freq: Every day | ORAL | Status: DC
  Administered 2023-04-04 – 2023-04-06 (×3): 40 mg via ORAL
  Filled 2023-04-04 (×4): qty 1

## 2023-04-04 NOTE — Plan of Care (Signed)

## 2023-04-04 NOTE — Progress Notes (Signed)
[facility]                                                                               Kayla Richard, is a 32 y.o. female, DOB - November 25, 1990, MWN:[id number] Admit date - 04/03/2023    Outpatient Primary MD for the Kayla Richard is Kayla Richard, No Pcp Per  LOS - 1  days    Brief summary    32 y.o. female, with H/O Obesity, HTN, ? Drug abuse, lives with her mother, admitted for toxic encephalopathy.   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  Urine drug screen positive for cocaine, amphetamine and marijuana.  Also history of smoking.  At this time she has been IVC by the ER physician, she will be admitted to the hospital with one-to-one sitter,  Psychiatry consulted.    2.  Dehydration, AKI and hypokalemia.  Hydrated with improvement in renal parameters.    3.  Prolonged QTc.   Secondary to hypokalemia .  Repeat EKG in the morning.    4.  Hypertension.  Well controlled.    5.  Morbid obesity.  Follow-up with PCP.   6. UTI  follow up urine cultures, resume rocephin.    7.Leukocytosis - improving.     8. Constipation:  Resume senna and colace, miralax.     Estimated body mass index is 46.58 kg/m as calculated from the following:   Height as of this encounter: 4\' 11"  (1.499 m).   Weight as of this encounter: 104.6 kg.  Code Status: full code.  DVT Prophylaxis:  heparin injection 5,000 Units Start: 04/03/23 2200   Level of Care: Level of care: Telemetry Medical Family Communication: none at bedside.   Disposition Plan:     Remains inpatient appropriate:  psychiatry consult.   Procedures:   None.    Antimicrobials:   Anti-infectives (From admission, onward)    Start     Dose/Rate Route Frequency Ordered Stop   04/03/23 2100  cefTRIAXone (ROCEPHIN) 1 g in sodium chloride 0.9 % 100 mL IVPB        1 g 200 mL/hr over 30 Minutes Intravenous Every 24 hours 04/03/23 2000 04/06/23 2059         Medications  Scheduled Meds:  acetaminophen  650 mg Rectal Once   acetaminophen  650 mg Oral Once   amLODipine  10 mg Oral Daily   heparin  5,000 Units Subcutaneous Q8H   nicotine  21 mg Transdermal Daily   pantoprazole  40 mg Oral Q0600   polyethylene glycol  17 g Oral Daily   senna-docusate  2 tablet Oral BID   Continuous Infusions:  cefTRIAXone (ROCEPHIN)  IV Stopped (04/03/23 2220)   PRN Meds:.acetaminophen **OR** acetaminophen, alum & mag hydroxide-simeth, haloperidol lactate, hydrALAZINE, LORazepam, promethazine    Subjective:   [name] was seen and examined today.  Reports being constipated.   Objective:   Vitals:   04/04/23 0016 04/04/23 0338 04/04/23 0912 04/04/23 1240  BP: (!) 174/87 (!) 169/90 (!) 186/117 (!) 138/109  Pulse: (!) 103 100 99 91  Resp: 18 18 (!) 24 (!) 22  Temp: 98.6 F (37 C) 98.7 F (37.1 C) 98.4 F (36.9 C) 98.4 F (36.9 C)  TempSrc: Oral Oral Oral Oral  SpO2: 92% 96% 97% 96%  Weight:      Height:        Intake/Output Summary (Last 24 hours) at 04/04/2023 1503 Last data filed at 04/04/2023 0751 Gross per 24 hour  Intake 280.42 ml  Output --  Net 280.42 ml   Filed Weights   04/03/23 2017  Weight: 104.6 kg     Exam General: Alert and oriented x 3, NAD Cardiovascular: S1 S2 auscultated, no murmurs, RRR Respiratory: Clear to auscultation bilaterally, no wheezing, rales or rhonchi Gastrointestinal: Soft, nontender, nondistended, + bowel sounds Ext: no pedal edema bilaterally Neuro: AAOx3, Cr N's II- XII. Strength 5/5 upper and lower extremities bilaterally Skin: No rashes Psych: Normal affect and demeanor, alert and oriented x3    Data Reviewed:  I have personally reviewed following labs and imaging studies   CBC Lab Results  Component Value Date   WBC 14.9 (H) 04/04/2023   RBC 3.99 04/04/2023   HGB 11.7 (L) 04/04/2023   HCT 35.1 (L) 04/04/2023   MCV 88.0 04/04/2023   MCH 29.3 04/04/2023   PLT 197  04/04/2023   MCHC 33.3 04/04/2023   RDW 13.2 04/04/2023   LYMPHSABS 4.5 (H) 04/04/2023   MONOABS 1.1 (H) 04/04/2023   EOSABS 0.1 04/04/2023   BASOSABS 0.1 04/04/2023     Last metabolic panel Lab Results  Component Value Date   NA 140 04/04/2023   K 3.2 (L) 04/04/2023   CL 102 04/04/2023   CO2 23 04/04/2023   BUN 13 04/04/2023   CREATININE 1.21 (H) 04/04/2023   GLUCOSE 90 04/04/2023   GFRNONAA >60 04/04/2023   GFRAA 50 (L) 04/03/2019   CALCIUM 8.5 (L) 04/04/2023   PHOS 4.2 03/30/2019   PROT 6.9 04/04/2023   ALBUMIN 3.5 04/04/2023   LABGLOB 3.3 11/28/2016   AGRATIO 1.3 11/28/2016   BILITOT 0.6 04/04/2023   ALKPHOS 44 04/04/2023   AST 20 04/04/2023   ALT 15 04/04/2023   ANIONGAP 15 04/04/2023    CBG (last 3)  No results for input(s): "GLUCAP" in the last 72 hours.    Coagulation Profile: No results for input(s): "INR", "PROTIME" in the last 168 hours.   Radiology Studies: CT Head Wo Contrast  Result Date: 04/03/2023 CLINICAL DATA:  Mental status change with unknown cause. EXAM: CT HEAD WITHOUT CONTRAST TECHNIQUE: Contiguous axial images were obtained from the base of the skull through the vertex without intravenous contrast. RADIATION DOSE REDUCTION: This exam was performed according to the departmental dose-optimization program which includes automated exposure control, adjustment of the mA and/or kV according to Kayla Richard size and/or use of iterative reconstruction technique. COMPARISON:  03/30/2019 FINDINGS: Brain: Low-density appearance at the left cerebellum is best attributed to streak artifact given its band like shape and orientation on reformats. No evidence of acute infarct, hemorrhage, hydrocephalus, or collection. Vascular: No hyperdense vessel or unexpected calcification. Skull: Normal. Negative for fracture or focal lesion. Sinuses/Orbits: No acute finding. Other: Extensive artifact from Kayla Richard's earrings. IMPRESSION: Degraded visualization from Kayla Richard's  earrings. No acute finding when allowing for associated streak artifact. Electronically Signed   By: Tiburcio Pea M.D.   On: 04/03/2023 15:37   DG Chest Port 1 View  Result Date: 04/03/2023 CLINICAL DATA:  altered mental status EXAM: PORTABLE CHEST 1 VIEW COMPARISON:  07/26/2022. FINDINGS: Low lung volume. Mild central pulmonary vascular congestion, most likely  accentuated by low lung volume. Bilateral lung fields are otherwise clear. Bilateral costophrenic angles are clear. Stable mildly enlarged cardio-mediastinal silhouette. No acute osseous abnormalities. The soft tissues are within normal limits. IMPRESSION: *Mild central pulmonary vascular congestion, most likely accentuated by low lung volume. Electronically Signed   By: Jules Schick M.D.   On: 04/03/2023 15:08       Kathlen Mody M.D. [facility] 04/04/2023, 3:03 PM  Available via Epic secure chat 7am-7pm After 7 pm, please refer to night coverage provider listed on amion.

## 2023-04-04 NOTE — Plan of Care (Signed)
  Problem: Safety: Goal: Ability to remain free from injury will improve Outcome: Progressing   Problem: Skin Integrity: Goal: Risk for impaired skin integrity will decrease Outcome: Progressing   Problem: Safety: Goal: Non-violent Restraint(s) Outcome: Completed/Met

## 2023-04-04 NOTE — Evaluation (Signed)
Physical Therapy Evaluation and Discharge Patient Details Name: Kayla Richard MRN: [id number] DOB: 26-Sep-1990 Today's Date: 04/04/2023  History of Present Illness  32 y.o. 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  Urine drug screen positive for cocaine, amphetamine and marijuana. H/O Obesity, HTN,  Clinical Impression  Patient evaluated by Physical Therapy with no further acute PT needs identified. All education has been completed and the patient has no further questions. Low fall risk based on standardized testing (BERG and DGI). Pt feels near baseline. Ambulates without overt LOB or need for assistive device. See below for any follow-up Physical Therapy or equipment needs. PT is signing off. Thank you for this referral.         Assistance Recommended at Discharge None     Equipment Recommendations None recommended by PT  Recommendations for Other Services       Functional Status Assessment Patient has had a recent decline in their functional status and demonstrates the ability to make significant improvements in function in a reasonable and predictable amount of time.     Precautions / Restrictions Precautions Precautions: None Restrictions Weight Bearing Restrictions: No      Mobility  Bed Mobility Overal bed mobility: Modified Independent             General bed mobility comments: extra time    Transfers Overall transfer level: Modified independent Equipment used: None               General transfer comment: Mild sway, self corrects no AD    Ambulation/Gait Ambulation/Gait assistance: Modified independent (Device/Increase time) Gait Distance (Feet): 250 Feet Assistive device: None Gait Pattern/deviations: WFL(Within Functional Limits), Wide base of support Gait velocity: WFL Gait velocity interpretation: >2.62 ft/sec, indicative of community ambulatory   General Gait Details: WFL, wide BOS 2/2 habitus.  No overt LOB noted, tolerated dynamic challenges. States she feels near baseline.  Stairs Stairs:  (Declined)          Wheelchair Mobility     Tilt Bed    Modified Rankin (Stroke Patients Only)       Balance Overall balance assessment: Modified Independent                               Standardized Balance Assessment Standardized Balance Assessment : Berg Balance Test, Dynamic Gait Index Berg Balance Test Sit to Stand: Able to stand without using hands and stabilize independently Standing Unsupported: Able to stand safely 2 minutes Sitting with Back Unsupported but Feet Supported on Floor or Stool: Able to sit safely and securely 2 minutes Stand to Sit: Sits safely with minimal use of hands Transfers: Able to transfer safely, minor use of hands Standing Unsupported with Eyes Closed: Able to stand 10 seconds safely Standing Ubsupported with Feet Together: Able to place feet together independently and stand 1 minute safely From Standing, Reach Forward with Outstretched Arm: Can reach confidently >25 cm (10") From Standing Position, Pick up Object from Floor: Able to pick up shoe safely and easily From Standing Position, Turn to Look Behind Over each Shoulder: Looks behind from both sides and weight shifts well Turn 360 Degrees: Able to turn 360 degrees safely in 4 seconds or less Standing Unsupported, Alternately Place Feet on Step/Stool: Able to stand independently and complete 8 steps >20 seconds Standing Unsupported, One Foot in Front: Able to plae foot ahead of the other independently and hold  30 seconds Standing on One Leg: Able to lift leg independently and hold equal to or more than 3 seconds Total Score: 52 Dynamic Gait Index Level Surface: Normal Change in Gait Speed: Normal Gait with Horizontal Head Turns: Mild Impairment Gait with Vertical Head Turns: Normal Gait and Pivot Turn: Normal Step Over Obstacle: Mild Impairment Step Around Obstacles:  Normal Steps: Mild Impairment Total Score: 21       Pertinent Vitals/Pain Pain Assessment Pain Assessment: No/denies pain    Home Living Family/patient expects to be discharged to:: Private residence Living Arrangements: Parent Available Help at Discharge: Family;Available 24 hours/day Type of Home: House Home Access: Ramped entrance     Alternate Level Stairs-Number of Steps: 13 Home Layout: Multi-level;Bed/bath upstairs Home Equipment: Agricultural consultant (2 wheels);Cane - single point;Wheelchair - manual (States she has access if needed)      Prior Function Prior Level of Function : Independent/Modified Independent;Working/employed;Driving             Mobility Comments: in no device 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        General Comments      Exercises     Assessment/Plan    PT Assessment Patient does not need any further PT services  PT Problem List         PT Treatment Interventions      PT Goals (Current goals can be found in the Care Plan section)  Acute Rehab PT Goals Patient Stated Goal: Eat PT Goal Formulation: All assessment and education complete, DC therapy    Frequency       Co-evaluation               AM-PAC PT "6 Clicks" Mobility  Outcome Measure Help needed turning from your back to your side while in a flat bed without using  bedrails?: None Help needed moving from lying on your back to sitting on the side of a flat bed without using bedrails?: None Help needed moving to and from a bed to a chair (including a wheelchair)?: None Help needed standing up from a chair using your arms (e.g., wheelchair or bedside chair)?: None Help needed to walk in hospital room?: None Help needed climbing 3-5 steps with a railing? : None 6 Click Score: 24    End of Session   Activity Tolerance: Patient tolerated treatment well Patient left: in bed;with call bell/phone within reach;with nursing/sitter in room (Tech and sitter with pt, sitting EOB) Nurse Communication: Mobility status PT Visit Diagnosis: Other abnormalities of gait and mobility (R26.89);Unsteadiness on feet (R26.81)    Time: 1610-9604 PT Time Calculation (min) (ACUTE ONLY): 13 min   Charges:   PT Evaluation $PT Eval Low Complexity: 1 Low   PT General Charges $$ ACUTE PT VISIT: 1 Visit  Kathlyn [place], PT, DPT [facility]  Rehabilitation Services Physical Therapist Office: [phone] Website: [email or web address]   Berton Mount 04/04/2023, 9:36 AM

## 2023-04-04 NOTE — TOC Initial Note (Signed)
Transition of Care [facility]) - Initial/Assessment Note    Patient Details  Name: Kayla Richard MRN: [id number] Date of Birth: 15-Dec-1990  Transition of [facility]) CM/SW Contact:    Kermit Balo, RN Phone Number: 04/04/2023, 1:44 PM  Clinical Narrative:                 Pt is from home with her parents. She says they are with her most of the time.  No DME. No home medications. She drives self as needed.  Pt thinks she has health insurance. CM asked her to have her parents bring the card.  No PCP. She was agreeable with being set up at one of the [facility]. Cm placed appointment on the AVS. Pt will need 2 months of medications at d/c to hold her until seen in the clinics. Will update the MD. TOC following.  Expected Discharge Plan: Home/Self Care Barriers to Discharge: Continued Medical Work up, Inadequate or no insurance   Patient Goals and CMS Choice            Expected Discharge Plan and Services   Discharge Planning Services: CM Consult   Living arrangements for the past 2 months: Single Family Home                                      Prior Living Arrangements/Services Living arrangements for the past 2 months: Single Family Home Lives with:: Parents Patient language and need for interpreter reviewed:: Yes Do you feel safe going back to the place where you live?: Yes            Criminal Activity/Legal Involvement Pertinent to Current Situation/Hospitalization: No - Comment as needed  Activities of Daily Living Home Assistive Devices/Equipment: Blood pressure cuff ADL Screening (condition at time of admission) Patient's cognitive ability adequate to safely complete daily activities?: Yes Is the patient deaf or have difficulty hearing?: No Does the patient have difficulty seeing, even when wearing glasses/contacts?: No Does the patient have difficulty concentrating, remembering, or making decisions?: No Patient able to express need for assistance with  ADLs?: Yes Does the patient have difficulty dressing or bathing?: No Independently performs ADLs?: Yes (appropriate for developmental age) Does the patient have difficulty walking or climbing stairs?: No Weakness of Legs: None Weakness of Arms/Hands: None  Permission Sought/Granted                  Emotional Assessment Appearance:: Appears stated age Attitude/Demeanor/Rapport: Engaged Affect (typically observed): Accepting Orientation: : Oriented to Self, Oriented to Place, Oriented to Situation Alcohol / Substance Use: Illicit Drugs, Alcohol Use Psych Involvement: No (comment)  Admission diagnosis:  Toxic encephalopathy [G92.9] Hypokalemia [E87.6] Substance abuse (HCC) [F19.10] AKI (acute kidney injury) (HCC) [N17.9] Psychosis, unspecified psychosis type (HCC) [F29] Patient Active Problem List   Diagnosis Date Noted   Toxic encephalopathy 04/03/2023   Rhabdomyolysis 03/30/2019   AKI (acute kidney injury) (HCC) 03/30/2019   Tobacco abuse 03/30/2019   Fall 03/30/2019   HTN (hypertension) 03/30/2019   Leukocytosis 03/30/2019   Paresthesia    Hyperthyroidism 12/25/2016   Asymptomatic bacteriuria during pregnancy 12/03/2016   Supervision of high-risk pregnancy 11/28/2016   Chronic hypertension 11/28/2016   Hypertension in pregnancy, antepartum 11/28/2016   Morbid obesity with BMI of 40.0-44.9, adult (HCC) 05/02/2015   Incompetent cervix 04/15/2015   Hx of PTL (preterm labor), current pregnancy 04/14/2015   Short cervix  04/14/2015   PCP:  Patient, No Pcp Per Pharmacy:   [id number] - [place], [place] - [address] AT [facility] [address] [place] [address] Phone: [phone] Fax: [phone]     Social Determinants of Health (SDOH) Social History: SDOH Screenings   Food Insecurity: No Food Insecurity (04/03/2023)  Housing: Low Risk  (04/03/2023)  Transportation Needs: No Transportation Needs (04/03/2023)  Utilities: Not At Risk  (04/03/2023)  Tobacco Use: High Risk (04/03/2023)   SDOH Interventions:     Readmission Risk Interventions     No data to display

## 2023-04-05 DIAGNOSIS — F29 Unspecified psychosis not due to a substance or known physiological condition: Secondary | ICD-10-CM

## 2023-04-05 LAB — COMPREHENSIVE METABOLIC PANEL
ALT: 19 U/L (ref 0–44)
AST: 20 U/L (ref 15–41)
Albumin: 3.2 g/dL — ABNORMAL LOW (ref 3.5–5.0)
Alkaline Phosphatase: 61 U/L (ref 38–126)
Anion gap: 7 (ref 5–15)
BUN: 8 mg/dL (ref 6–20)
CO2: 27 mmol/L (ref 22–32)
Calcium: 8.6 mg/dL — ABNORMAL LOW (ref 8.9–10.3)
Chloride: 104 mmol/L (ref 98–111)
Creatinine, Ser: 0.91 mg/dL (ref 0.44–1.00)
GFR, Estimated: >60 mL/min (ref >60)
Glucose, Bld: 108 mg/dL — ABNORMAL HIGH (ref 70–99)
Potassium: 3 mmol/L — ABNORMAL LOW (ref 3.5–5.1)
Sodium: 138 mmol/L (ref 135–145)
Total Bilirubin: 0.2 mg/dL — ABNORMAL LOW (ref 0.3–1.2)
Total Protein: 6.7 g/dL (ref 6.5–8.1)

## 2023-04-05 LAB — CBC WITH DIFFERENTIAL/PLATELET
Abs Immature Granulocytes: 0.04 10*3/uL (ref 0.00–0.07)
Basophils Absolute: 0 10*3/uL (ref 0.0–0.1)
Basophils Relative: 0 %
Eosinophils Absolute: 0.5 10*3/uL (ref 0.0–0.5)
Eosinophils Relative: 4 %
HCT: 35.2 % — ABNORMAL LOW (ref 36.0–46.0)
Hemoglobin: 11.5 g/dL — ABNORMAL LOW (ref 12.0–15.0)
Immature Granulocytes: 0 %
Lymphocytes Relative: 26 %
Lymphs Abs: 3.3 10*3/uL (ref 0.7–4.0)
MCH: 29 pg (ref 26.0–34.0)
MCHC: 32.7 g/dL (ref 30.0–36.0)
MCV: 88.7 fL (ref 80.0–100.0)
Monocytes Absolute: 0.7 10*3/uL (ref 0.1–1.0)
Monocytes Relative: 5 %
Neutro Abs: 8.2 10*3/uL — ABNORMAL HIGH (ref 1.7–7.7)
Neutrophils Relative %: 65 %
Platelets: 207 10*3/uL (ref 150–400)
RBC: 3.97 MIL/uL (ref 3.87–5.11)
RDW: 13.1 % (ref 11.5–15.5)
WBC: 12.8 10*3/uL — ABNORMAL HIGH (ref 4.0–10.5)
nRBC: 0 % (ref 0.0–0.2)

## 2023-04-05 LAB — BRAIN NATRIURETIC PEPTIDE: B Natriuretic Peptide: 50.7 pg/mL (ref 0.0–100.0)

## 2023-04-05 LAB — MAGNESIUM: Magnesium: 1.6 mg/dL — ABNORMAL LOW (ref 1.7–2.4)

## 2023-04-05 MED ORDER — POTASSIUM CHLORIDE CRYS ER 20 MEQ PO TBCR
40.0000 meq | EXTENDED_RELEASE_TABLET | Freq: Two times a day (BID) | ORAL | Status: AC
  Administered 2023-04-05 – 2023-04-06 (×3): 40 meq via ORAL
  Filled 2023-04-05 (×2): qty 2

## 2023-04-05 MED ORDER — HYDRALAZINE HCL 25 MG PO TABS
ORAL_TABLET | ORAL | Status: AC
  Administered 2023-04-05: 25 mg
  Filled 2023-04-05: qty 1

## 2023-04-05 MED ORDER — MAGNESIUM SULFATE 2 GM/50ML IV SOLN
INTRAVENOUS | Status: AC
  Filled 2023-04-05: qty 50

## 2023-04-05 MED ORDER — MAGNESIUM SULFATE 2 GM/50ML IV SOLN
2.0000 g | Freq: Once | INTRAVENOUS | Status: AC
  Administered 2023-04-05: 2 g via INTRAVENOUS

## 2023-04-05 MED ORDER — CARMEX CLASSIC LIP BALM EX OINT
1.0000 | TOPICAL_OINTMENT | CUTANEOUS | Status: DC | PRN
  Filled 2023-04-05: qty 10

## 2023-04-05 MED ORDER — POTASSIUM CHLORIDE 10 MEQ/100ML IV SOLN: 10.0000 meq | INTRAVENOUS | Status: DC

## 2023-04-05 MED ORDER — MELATONIN 5 MG PO TABS
10.0000 mg | ORAL_TABLET | Freq: Every evening | ORAL | Status: DC | PRN
  Administered 2023-04-05: 10 mg via ORAL
  Filled 2023-04-05: qty 2

## 2023-04-05 MED ORDER — HEPARIN SODIUM (PORCINE) 5000 UNIT/ML IJ SOLN
INTRAMUSCULAR | Status: AC
  Filled 2023-04-05: qty 1

## 2023-04-05 MED ORDER — POTASSIUM CHLORIDE CRYS ER 20 MEQ PO TBCR
EXTENDED_RELEASE_TABLET | ORAL | Status: AC
  Administered 2023-04-05: 40 meq
  Filled 2023-04-05: qty 2

## 2023-04-05 MED ORDER — POTASSIUM CHLORIDE CRYS ER 20 MEQ PO TBCR
EXTENDED_RELEASE_TABLET | ORAL | Status: AC
  Filled 2023-04-05: qty 2

## 2023-04-05 MED ORDER — LORAZEPAM 2 MG/ML IJ SOLN: 1.0000 mg | Freq: Four times a day (QID) | INTRAMUSCULAR | Status: DC | PRN

## 2023-04-05 NOTE — Consult Note (Cosign Needed Addendum)
[facility] Face-to-Face Psychiatry Consult   Reason for Consult:  "encephalopathy, recreational drug abuse Referring Physician:  Kathlen Mody Patient Identification: Kayla Richard MRN:  [id number] Principal Diagnosis: Toxic encephalopathy Diagnosis:  Principal Problem:   Toxic encephalopathy Active Problems:   Morbid obesity with BMI of 40.0-44.9, adult (HCC)   Chronic hypertension   AKI (acute kidney injury) (HCC)   Tobacco abuse   Total Time spent with patient: 15 minutes  Subjective:  Kayla Richard is a 32 y.o. female patient admitted with paranoia, agitation and confusion after illicit substance use. Patient was seen and evaluated faced to face by this provider.  Physiological scientist at bedside. Rohini reports paranoia that is worse with substance use.  Chart review UDS positive for cocaine, amphetamines and marijuana.  states she last used cocaine prior to her arrival.  States she resides at home with her niece and parents.  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  States sometimes she hears "sounds" coming from other rooms.  States she has been experiencing auditory hallucinations for the past 2 years.  She denied that she is ever followed by therapy or psychiatry.  Denied a previous mental health diagnosis.  Denied that she is prescribed any psychotropic medications currently.    She presents pleasant, cooperative throughout this assessment she presents pleasant, cooperative throughout this assessment.  Does not appear to be responding to internal stimuli.  Discussed initiating Abilify 5 mg to help with reported symptoms.  Stated that she would like to speak with a therapist before starting medications. Denied previous inpatient admissions.  Chart reviewed no documented overt disruptive behaviors since benign transferred to the medical floor.  Recommend follow-up with outpatient services for psychiatry therapy and substance abuse treatment at discharge.   During  evaluation Kayla Richard resting in bed in no acute distress. Patient did ambulate to answer the telephone. She is alert/oriented x 4; calm/cooperative; and mood congruent with affect. He is speaking in a clear tone at moderate volume, and normal pace; with good eye contact. His thought process is coherent and relevant; There is no indication that he is currently responding to internal/external stimuli or experiencing delusional thought content; and he has denied suicidal/self-harm/homicidal ideation, psychosis, and paranoia during this assessment. Stated that she doses hears "whispers for time to time" Patient has remained calm throughout assessment and has answered questions appropriately.     At this time Kayla Richard is educated and verbalizes understanding of mental health resources and other crisis services in the community. She is instructed to call 911 and present to the nearest emergency room should she experience any suicidal/homicidal ideation, auditory/visual/hallucinations, or detrimental worsening of her mental health condition. She was a also advised by Clinical research associate that he could call the toll-free phone on insurance card to assist with identifying in network counselors and agencies or number on back of Medicaid card to speak with care coordinator.    HPI:  per admission assessment note: "32 year old female no reported significant past medical history presents today with agitation, paranoia, confusion. Patient presents with GPD who states that she was called multiple times today asking for help. Family had taken out IVC paperwork. Patient is scared and states that people are trying to hurt her. Family reports that she has been drinking alcohol and using cocaine. They do not report any prior similar episodes of confusion, paranoia, or psychosis in the past . "  Past Psychiatric History:   Risk to Self:   Risk to Others:  Prior Inpatient Therapy:   Prior Outpatient Therapy:    Past Medical History:   Past Medical History:  Diagnosis Date   Chronic hypertension    Cyst of breast    Neuropathy    Preterm labor     Past Surgical History:  Procedure Laterality Date   CERVICAL CERCLAGE N/A 04/15/2015   Procedure: CERCLAGE CERVICAL;  Surgeon: Jaymes Graff, MD;  Location: [facility];  Service: Gynecology;  Laterality: N/A;   Family History:  Family History  Problem Relation Age of Onset   Hypertension Mother    Diabetes Father    Family Psychiatric  History:  Social History:  Social History   Substance and Sexual Activity  Alcohol Use No     Social History   Substance and Sexual Activity  Drug Use No    Social History   Socioeconomic History   Marital status: Legally Separated    Spouse name: Not on file   Number of children: Not on file   Years of education: Not on file   Highest education level: Not on file  Occupational History   Not on file  Tobacco Use   Smoking status: Every Day    Current packs/day: 0.00    Average packs/day: 0.3 packs/day for 6.0 years (1.5 ttl pk-yrs)    Types: Cigarettes    Start date: 10/28/2010    Last attempt to quit: 10/28/2016    Years since quitting: 6.4   Smokeless tobacco: Current  Vaping Use   Vaping status: Never Used  Substance and Sexual Activity   Alcohol use: No   Drug use: No   Sexual activity: Yes    Birth control/protection: None  Other Topics Concern   Not on file  Social History Narrative   Not on file   Social Determinants of Health   Financial Resource Strain: Not on file  Food Insecurity: No Food Insecurity (04/03/2023)   Hunger Vital Sign    Worried About Running Out of Food in the Last Year: Never true    Ran Out of Food in the Last Year: Never true  Transportation Needs: No Transportation Needs (04/03/2023)   PRAPARE - Administrator, Civil Service (Medical): No    Lack of Transportation (Non-Medical): No  Physical Activity: Not on file  Stress: Not on file  Social Connections: Not on file    Additional Social History:    Allergies:   Allergies  Allergen Reactions   Hydrocodone Nausea And Vomiting   Bee Venom    Mango Flavor Hives    Labs:  Results for orders placed or performed during the hospital encounter of 04/03/23 (from the past 48 hour(s))  Urine rapid drug screen (hosp performed)     Status: Abnormal   Collection Time: 04/03/23  3:31 PM  Result Value Ref Range   Opiates NONE DETECTED NONE DETECTED   Cocaine POSITIVE (A) NONE DETECTED   Benzodiazepines NONE DETECTED NONE DETECTED   Amphetamines POSITIVE (A) NONE DETECTED   Tetrahydrocannabinol POSITIVE (A) NONE DETECTED   Barbiturates NONE DETECTED NONE DETECTED    Comment: (NOTE) DRUG SCREEN FOR MEDICAL PURPOSES ONLY.  IF CONFIRMATION IS NEEDED FOR ANY PURPOSE, NOTIFY LAB WITHIN 5 DAYS.  LOWEST DETECTABLE LIMITS FOR URINE DRUG SCREEN Drug Class                     Cutoff (ng/mL) Amphetamine and metabolites    1000 Barbiturate and metabolites    200 Benzodiazepine  200 Opiates and metabolites        300 Cocaine and metabolites        300 THC                            50 Performed at [facility] Lab, [address] [place], [place] [address]   Urinalysis, Routine w reflex microscopic -Urine, Catheterized     Status: Abnormal   Collection Time: 04/03/23  3:32 PM  Result Value Ref Range   Color, Urine AMBER (A) YELLOW    Comment: BIOCHEMICALS MAY BE AFFECTED BY COLOR   APPearance CLOUDY (A) CLEAR   Specific Gravity, Urine 1.029 1.005 - 1.030   pH 5.0 5.0 - 8.0   Glucose, UA NEGATIVE NEGATIVE mg/dL   Hgb urine dipstick NEGATIVE NEGATIVE   Bilirubin Urine NEGATIVE NEGATIVE   Ketones, ur NEGATIVE NEGATIVE mg/dL   Protein, ur >=621 (A) NEGATIVE mg/dL   Nitrite NEGATIVE NEGATIVE   Leukocytes,Ua TRACE (A) NEGATIVE   RBC / HPF 0-5 0 - 5 RBC/hpf   WBC, UA 6-10 0 - 5 WBC/hpf   Bacteria, UA FEW (A) NONE SEEN   Squamous Epithelial / HPF 0-5 0 - 5 /HPF   Mucus PRESENT    Hyaline  Casts, UA PRESENT     Comment: Performed at [facility] Lab, [address] [place], [place] [address]  C-reactive protein     Status: Abnormal   Collection Time: 04/03/23  4:28 PM  Result Value Ref Range   CRP 3.0 (H) <1.0 mg/dL    Comment: Performed at [facility] Lab, [address], [place], [place] [address]  HIV Antibody (routine testing w rflx)     Status: None   Collection Time: 04/03/23  7:20 PM  Result Value Ref Range   HIV Screen 4th Generation wRfx Non Reactive Non Reactive    Comment: Performed at [facility] Lab, [address] [place], [place] [address]  CBC     Status: Abnormal   Collection Time: 04/03/23  7:20 PM  Result Value Ref Range   WBC 17.0 (H) 4.0 - 10.5 K/uL   RBC 4.17 3.87 - 5.11 MIL/uL   Hemoglobin 12.0 12.0 - 15.0 g/dL   HCT 84.1 32.4 - 40.1 %   MCV 88.2 80.0 - 100.0 fL   MCH 28.8 26.0 - 34.0 pg   MCHC 32.6 30.0 - 36.0 g/dL   RDW 02.7 25.3 - 66.4 %   Platelets 205 150 - 400 K/uL   nRBC 0.0 0.0 - 0.2 %    Comment: Performed at [facility] Lab, [address], [place], [place] [address]  Creatinine, serum     Status: Abnormal   Collection Time: 04/03/23  7:20 PM  Result Value Ref Range   Creatinine, Ser 1.45 (H) 0.44 - 1.00 mg/dL   GFR, Estimated 49 (L) >60 mL/min    Comment: (NOTE) Calculated using the CKD-EPI Creatinine Equation (2021) Performed at [facility] Lab, [address] [place], [place] [address]   Brain natriuretic peptide     Status: None   Collection Time: 04/04/23  1:21 AM  Result Value Ref Range   B Natriuretic Peptide 44.2 0.0 - 100.0 pg/mL    Comment: Performed at [facility] Lab, [address], [place], [place] [address]  CBC with Differential/Platelet     Status: Abnormal   Collection Time: 04/04/23  1:28 AM  Result Value Ref Range   WBC 14.9 (H) 4.0 - 10.5 K/uL   RBC 3.99 3.87 - 5.11 MIL/uL   Hemoglobin 11.7 (L) 12.0 - 15.0 g/dL   HCT 08.6 (L) 57.8 - 46.9 %   MCV 88.0 80.0 - 100.0 fL   MCH 29.3  26.0 - 34.0 pg   MCHC 33.3 30.0 - 36.0 g/dL   RDW 62.9 52.8 - 41.3 %   Platelets 197 150 - 400 K/uL   nRBC 0.0 0.0 - 0.2 %   Neutrophils Relative % 61 %   Neutro Abs 9.1 (H) 1.7 - 7.7 K/uL   Lymphocytes Relative 31 %   Lymphs Abs 4.5 (H) 0.7 - 4.0 K/uL   Monocytes Relative 7 %   Monocytes Absolute 1.1 (H) 0.1 - 1.0 K/uL   Eosinophils Relative 1 %   Eosinophils Absolute 0.1 0.0 - 0.5 K/uL   Basophils Relative 0 %   Basophils Absolute 0.1 0.0 - 0.1 K/uL   Immature Granulocytes 0 %   Abs Immature Granulocytes 0.04 0.00 - 0.07 K/uL    Comment: Performed at [facility] Lab, [address] [place], [place] [address]  Magnesium     Status: None   Collection Time: 04/04/23  1:28 AM  Result Value Ref Range   Magnesium 1.8 1.7 - 2.4 mg/dL    Comment: Performed at [facility] Lab, [address], [place], [place] [address]  Comprehensive metabolic panel     Status: Abnormal   Collection Time: 04/04/23  1:28 AM  Result Value Ref Range   Sodium 140 135 - 145 mmol/L   Potassium 3.2 (L) 3.5 - 5.1 mmol/L   Chloride 102 98 - 111 mmol/L   CO2 23 22 - 32 mmol/L   Glucose, Bld 90 70 - 99 mg/dL    Comment: Glucose reference range applies only to samples taken after fasting for at least 8 hours.   BUN 13 6 - 20 mg/dL   Creatinine, Ser 3.66 (H) 0.44 - 1.00 mg/dL   Calcium 8.5 (L) 8.9 - 10.3 mg/dL   Total Protein 6.9 6.5 - 8.1 g/dL   Albumin 3.5 3.5 - 5.0 g/dL   AST 20 15 - 41 U/L   ALT 15 0 - 44 U/L   Alkaline Phosphatase 44 38 - 126 U/L   Total Bilirubin 0.6 0.3 - 1.2 mg/dL   GFR, Estimated >44 >03 mL/min    Comment: (NOTE) Calculated using the CKD-EPI Creatinine Equation (2021)    Anion gap 15 5 - 15    Comment: Performed at [facility] Lab, [address] [place], [place] [address]  CBC with Differential/Platelet     Status: Abnormal   Collection Time: 04/05/23  2:31 AM  Result Value Ref Range   WBC 12.8 (H) 4.0 - 10.5 K/uL   RBC 3.97 3.87 - 5.11 MIL/uL   Hemoglobin 11.5  (L) 12.0 - 15.0 g/dL   HCT 95.6 (L) 38.7 - 56.4 %   MCV 88.7 80.0 - 100.0 fL   MCH 29.0 26.0 - 34.0 pg   MCHC 32.7 30.0 - 36.0 g/dL   RDW 33.2 95.1 - 88.4 %   Platelets 207 150 - 400 K/uL   nRBC 0.0 0.0 - 0.2 %   Neutrophils Relative % 65 %   Neutro Abs 8.2 (H) 1.7 - 7.7 K/uL   Lymphocytes Relative 26 %   Lymphs Abs 3.3 0.7 - 4.0 K/uL   Monocytes Relative 5 %  Monocytes Absolute 0.7 0.1 - 1.0 K/uL   Eosinophils Relative 4 %   Eosinophils Absolute 0.5 0.0 - 0.5 K/uL   Basophils Relative 0 %   Basophils Absolute 0.0 0.0 - 0.1 K/uL   Immature Granulocytes 0 %   Abs Immature Granulocytes 0.04 0.00 - 0.07 K/uL    Comment: Performed at [facility] Lab, [address], [place], [place] [address]  Magnesium     Status: Abnormal   Collection Time: 04/05/23  2:31 AM  Result Value Ref Range   Magnesium 1.6 (L) 1.7 - 2.4 mg/dL    Comment: Performed at [facility] Lab, [address], [place], [place] [address]  Comprehensive metabolic panel     Status: Abnormal   Collection Time: 04/05/23  2:31 AM  Result Value Ref Range   Sodium 138 135 - 145 mmol/L   Potassium 3.0 (L) 3.5 - 5.1 mmol/L   Chloride 104 98 - 111 mmol/L   CO2 27 22 - 32 mmol/L   Glucose, Bld 108 (H) 70 - 99 mg/dL    Comment: Glucose reference range applies only to samples taken after fasting for at least 8 hours.   BUN 8 6 - 20 mg/dL   Creatinine, Ser 6.96 0.44 - 1.00 mg/dL   Calcium 8.6 (L) 8.9 - 10.3 mg/dL   Total Protein 6.7 6.5 - 8.1 g/dL   Albumin 3.2 (L) 3.5 - 5.0 g/dL   AST 20 15 - 41 U/L   ALT 19 0 - 44 U/L   Alkaline Phosphatase 61 38 - 126 U/L   Total Bilirubin 0.2 (L) 0.3 - 1.2 mg/dL   GFR, Estimated >29 >52 mL/min    Comment: (NOTE) Calculated using the CKD-EPI Creatinine Equation (2021)    Anion gap 7 5 - 15    Comment: Performed at [facility] Lab, [address] [place], [place] [address]    Current Facility-Administered Medications  Medication Dose Route Frequency Provider Last Rate  Last Admin   acetaminophen (TYLENOL) suppository 650 mg  650 mg Rectal Once Margarita Grizzle, MD       acetaminophen (TYLENOL) tablet 650 mg  650 mg Oral Q6H PRN Leroy Sea, MD   650 mg at 04/04/23 1147   Or   acetaminophen (TYLENOL) suppository 650 mg  650 mg Rectal Q6H PRN Leroy Sea, MD       acetaminophen (TYLENOL) tablet 650 mg  650 mg Oral Once Margarita Grizzle, MD       alum & mag hydroxide-simeth (MAALOX/MYLANTA) 200-200-20 MG/5ML suspension 30 mL  30 mL Oral Q6H PRN Kathlen Mody, MD   30 mL at 04/04/23 1515   amLODipine (NORVASC) tablet 10 mg  10 mg Oral Daily Leroy Sea, MD   10 mg at 04/05/23 0927   cefTRIAXone (ROCEPHIN) 1 g in sodium chloride 0.9 % 100 mL IVPB  1 g Intravenous Q24H Leroy Sea, MD   Stopped at 04/04/23 2121   haloperidol lactate (HALDOL) injection 2 mg  2 mg Intramuscular Q6H PRN Leroy Sea, MD       heparin injection 5,000 Units  5,000 Units Subcutaneous Q8H Leroy Sea, MD   5,000 Units at 04/05/23 0507   hydrALAZINE (APRESOLINE) injection 10 mg  10 mg Intravenous Q6H PRN Leroy Sea, MD       hydrALAZINE (APRESOLINE) tablet 25 mg  25 mg Oral Q8H Kathlen Mody, MD   25 mg at 04/05/23 0507   lip balm (  CARMEX) ointment 1 Application  1 Application Topical PRN Kathlen Mody, MD       LORazepam (ATIVAN) injection 1 mg  1 mg Intravenous Q6H PRN Leroy Sea, MD       magnesium sulfate IVPB 2 g 50 mL  2 g Intravenous Once Kathlen Mody, MD       nicotine (NICODERM CQ - dosed in mg/24 hours) patch 21 mg  21 mg Transdermal Daily Leroy Sea, MD   21 mg at 04/05/23 0941   pantoprazole (PROTONIX) EC tablet 40 mg  40 mg Oral Q0600 Kathlen Mody, MD   40 mg at 04/05/23 0507   polyethylene glycol (MIRALAX / GLYCOLAX) packet 17 g  17 g Oral Daily Kathlen Mody, MD       potassium chloride SA (KLOR-CON M) CR tablet 40 mEq  40 mEq Oral BID Kathlen Mody, MD       promethazine (PHENERGAN) tablet 12.5 mg  12.5 mg Oral Q6H PRN  Leroy Sea, MD       senna-docusate (Senokot-S) tablet 2 tablet  2 tablet Oral BID Kathlen Mody, MD   2 tablet at 04/05/23 1610    Musculoskeletal: Strength & Muscle Tone: within normal limits Gait & Station: normal Patient leans: N/A    Psychiatric Specialty Exam:  Presentation  General Appearance: Appropriate for Environment  Eye Contact:Good  Speech:Clear and Coherent  Speech Volume:Normal  Handedness:Right   Mood and Affect  Mood:Anxious  Affect:Congruent   Thought Process  Thought Processes:Coherent  Descriptions of Associations:Intact  Orientation:Full (Time, Place and Person)  Thought Content:Paranoid Ideation  History of Schizophrenia/Schizoaffective disorder:No  Duration of Psychotic Symptoms:N/A  Hallucinations:Hallucinations: None  Ideas of Reference:None  Suicidal Thoughts:Suicidal Thoughts: No  Homicidal Thoughts:Homicidal Thoughts: No   Sensorium  Memory:Immediate Fair; Recent Fair; Remote Fair  Judgment:Fair  Insight:Good   Executive Functions  Concentration:Good  Attention Span:Fair  Recall:Fair  Fund of Knowledge:Fair  Language:Good   Psychomotor Activity  Psychomotor Activity:Psychomotor Activity: Normal   Assets  Assets:Desire for Improvement; Social Support   Sleep  Sleep:Sleep: Fair   Physical Exam: Physical Exam Vitals and nursing note reviewed.  Cardiovascular:     Rate and Rhythm: Normal rate.  Pulmonary:     Effort: Pulmonary effort is normal.     Breath sounds: Normal breath sounds.  Neurological:     Mental Status: She is oriented to person, place, and time.  Psychiatric:        Mood and Affect: Mood normal.        Behavior: Behavior normal.    Review of Systems  Psychiatric/Behavioral:  Positive for depression and hallucinations. Negative for suicidal ideas. The patient is nervous/anxious.   All other systems reviewed and are negative.  Blood pressure (!) 146/87, pulse 88,  temperature 98.2 F (36.8 C), temperature source Oral, resp. rate 18, height 4\' 11"  (1.499 m), weight 104.6 kg, SpO2 97%, unknown if currently breastfeeding. Body mass index is 46.58 kg/m.  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  Presented under involuntary commitment due to confusion, agitation and paranoia.  Discussed following up with substance abuse resources therapy psychiatry services.  She was receptive to plan.   -Discussed consideration for starting Abilify 5 mg po daily, patient stated she would like to speak with a therapist first before starting medication at this time.  -Psychiatry signing off.  Disposition: No evidence of imminent risk to self or others at present.   Patient does not meet criteria for psychiatric inpatient admission. Supportive therapy provided about ongoing stressors. Refer to IOP. Discussed crisis plan, support from social network, calling 911, coming to the Emergency Department, and calling Suicide Hotline.  Oneta Rack, NP 04/05/2023 2:08 PM

## 2023-04-05 NOTE — Plan of Care (Signed)
  Problem: Education: Goal: Knowledge of General Education information will improve Description: Including pain rating scale, medication(s)/side effects and non-pharmacologic comfort measures Outcome: Progressing   Problem: Clinical Measurements: Goal: Ability to maintain clinical measurements within normal limits will improve Outcome: Progressing Goal: Will remain free from infection Outcome: Progressing Goal: Respiratory complications will improve Outcome: Progressing   Problem: Activity: Goal: Risk for activity intolerance will decrease Outcome: Progressing   Problem: Coping: Goal: Level of anxiety will decrease Outcome: Progressing   Problem: Elimination: Goal: Will not experience complications related to urinary retention Outcome: Progressing   Problem: Skin Integrity: Goal: Risk for impaired skin integrity will decrease Outcome: Progressing

## 2023-04-05 NOTE — Progress Notes (Addendum)
[facility]                                                                               Kayla Richard, is a 32 y.o. female, DOB - 1991-07-09, BJY:[id number] Admit date - 04/03/2023    Outpatient Primary MD for the patient is Patient, No Pcp Per  LOS - 2  days    Brief summary    32 y.o. female, with H/O Obesity, HTN, ? Drug abuse, lives with her mother, admitted for toxic encephalopathy.   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   Urine drug screen positive for cocaine, amphetamine and marijuana. She is on IVC by the ER physician, she will be admitted to the hospital with one-to-one sitter,  Psychiatry consulted.     Dehydration, AKI and hypokalemia.  Hydrated with improvement in renal parameters.     Prolonged QTc.   Secondary to hypokalemia .  Repeat EKG today,    Hypokalemia and hypomagnesemia:  Replaced, recheck in am.    Hypertension.  Better controlled. On norvasc 10 mg daily and hydralazine 25 mg TID.    Morbid obesity.  Follow-up with PCP.  UTI   unfortunately cultures not sent, complete the course of ceftriaxone.    7.Leukocytosis - improving.     8. Constipation:  Resume senna and colace, miralax.     Estimated body mass index is 46.58 kg/m as calculated from the following:   Height as of this encounter: 4\' 11"  (1.499 m).   Weight as of this encounter: 104.6 kg.  Code Status: full code.  DVT Prophylaxis:  heparin injection 5,000 Units Start: 04/03/23 2200   Level of Care: Level of care: Telemetry Medical Family Communication: none at bedside.   Disposition Plan:     Remains inpatient appropriate:  psychiatry consult.   Procedures:   None.    Antimicrobials:   Anti-infectives (From admission, onward)    Start     Dose/Rate Route Frequency Ordered Stop   04/03/23 2100  cefTRIAXone (ROCEPHIN) 1 g in sodium chloride 0.9 % 100 mL IVPB        1 g 200  mL/hr over 30 Minutes Intravenous Every 24 hours 04/03/23 2000 04/06/23 2059        Medications  Scheduled Meds:  acetaminophen  650 mg Rectal Once   acetaminophen  650 mg Oral Once   amLODipine  10 mg Oral Daily   heparin  5,000 Units Subcutaneous Q8H   hydrALAZINE  25 mg Oral Q8H   nicotine  21 mg Transdermal Daily   pantoprazole  40 mg Oral Q0600   polyethylene glycol  17 g Oral Daily   senna-docusate  2 tablet Oral BID   Continuous Infusions:  cefTRIAXone (ROCEPHIN)  IV Stopped (04/04/23 2121)   PRN Meds:.acetaminophen **OR** acetaminophen, alum & mag hydroxide-simeth, haloperidol lactate, hydrALAZINE, lip balm, LORazepam, promethazine    Subjective:   Kayla Richard was seen and examined today.    Objective:   Vitals:   04/04/23 1948 04/05/23 0000 04/05/23 0348 04/05/23 0910  BP: (!) 145/96 (!) 165/83 (!) 174/83 (!) 146/87  Pulse:  91 92 84 88  Resp: 18 18 20 18   Temp: 97.8 F (36.6 C) 98.3 F (36.8 C) 97.8 F (36.6 C) 98.2 F (36.8 C)  TempSrc: Oral Oral Oral Oral  SpO2: 97% 95% 95% 97%  Weight:      Height:        Intake/Output Summary (Last 24 hours) at 04/05/2023 1129 Last data filed at 04/04/2023 2142 Gross per 24 hour  Intake 817.8 ml  Output --  Net 817.8 ml   Filed Weights   04/03/23 2017  Weight: 104.6 kg     Exam General: Alert and oriented x 3, NAD Cardiovascular: S1 S2 auscultated, no murmurs, RRR Respiratory: Clear to auscultation bilaterally, no wheezing, rales or rhonchi Gastrointestinal: Soft, nontender, nondistended, + bowel sounds Ext: no pedal edema bilaterally Neuro: AAOx3, Cr N's II- XII. Strength 5/5 upper and lower extremities bilaterally Skin: No rashes Psych: Normal affect and demeanor, alert and oriented x3    Data Reviewed:  I have personally reviewed following labs and imaging studies   CBC Lab Results  Component Value Date   WBC 12.8 (H) 04/05/2023   RBC 3.97 04/05/2023   HGB 11.5 (L) 04/05/2023   HCT 35.2  (L) 04/05/2023   MCV 88.7 04/05/2023   MCH 29.0 04/05/2023   PLT 207 04/05/2023   MCHC 32.7 04/05/2023   RDW 13.1 04/05/2023   LYMPHSABS 3.3 04/05/2023   MONOABS 0.7 04/05/2023   EOSABS 0.5 04/05/2023   BASOSABS 0.0 04/05/2023     Last metabolic panel Lab Results  Component Value Date   NA 140 04/04/2023   K 3.2 (L) 04/04/2023   CL 102 04/04/2023   CO2 23 04/04/2023   BUN 13 04/04/2023   CREATININE 1.21 (H) 04/04/2023   GLUCOSE 90 04/04/2023   GFRNONAA >60 04/04/2023   GFRAA 50 (L) 04/03/2019   CALCIUM 8.5 (L) 04/04/2023   PHOS 4.2 03/30/2019   PROT 6.9 04/04/2023   ALBUMIN 3.5 04/04/2023   LABGLOB 3.3 11/28/2016   AGRATIO 1.3 11/28/2016   BILITOT 0.6 04/04/2023   ALKPHOS 44 04/04/2023   AST 20 04/04/2023   ALT 15 04/04/2023   ANIONGAP 15 04/04/2023    CBG (last 3)  No results for input(s): "GLUCAP" in the last 72 hours.    Coagulation Profile: No results for input(s): "INR", "PROTIME" in the last 168 hours.   Radiology Studies: CT Head Wo Contrast  Result Date: 04/03/2023 CLINICAL DATA:  Mental status change with unknown cause. EXAM: CT HEAD WITHOUT CONTRAST TECHNIQUE: Contiguous axial images were obtained from the base of the skull through the vertex without intravenous contrast. RADIATION DOSE REDUCTION: This exam was performed according to the departmental dose-optimization program which includes automated exposure control, adjustment of the mA and/or kV according to patient size and/or use of iterative reconstruction technique. COMPARISON:  03/30/2019 FINDINGS: Brain: Low-density appearance at the left cerebellum is best attributed to streak artifact given its band like shape and orientation on reformats. No evidence of acute infarct, hemorrhage, hydrocephalus, or collection. Vascular: No hyperdense vessel or unexpected calcification. Skull: Normal. Negative for fracture or focal lesion. Sinuses/Orbits: No acute finding. Other: Extensive artifact from patient's  earrings. IMPRESSION: Degraded visualization from patient's earrings. No acute finding when allowing for associated streak artifact. Electronically Signed   By: Tiburcio Pea M.D.   On: 04/03/2023 15:37   DG Chest Port 1 View  Result Date: 04/03/2023 CLINICAL DATA:  altered mental status EXAM: PORTABLE CHEST 1 VIEW COMPARISON:  07/26/2022. FINDINGS: Low  lung volume. Mild central pulmonary vascular congestion, most likely accentuated by low lung volume. Bilateral lung fields are otherwise clear. Bilateral costophrenic angles are clear. Stable mildly enlarged cardio-mediastinal silhouette. No acute osseous abnormalities. The soft tissues are within normal limits. IMPRESSION: *Mild central pulmonary vascular congestion, most likely accentuated by low lung volume. Electronically Signed   By: Jules Schick M.D.   On: 04/03/2023 15:08       Kathlen Mody M.D. [facility] 04/05/2023, 11:29 AM  Available via Epic secure chat 7am-7pm After 7 pm, please refer to night coverage provider listed on amion.

## 2023-04-06 ENCOUNTER — Other Ambulatory Visit (HOSPITAL_COMMUNITY): Payer: Self-pay

## 2023-04-06 LAB — CBC WITH DIFFERENTIAL/PLATELET
Abs Immature Granulocytes: 0.04 10*3/uL (ref 0.00–0.07)
Basophils Absolute: 0.1 10*3/uL (ref 0.0–0.1)
Basophils Relative: 1 %
Eosinophils Absolute: 0.2 10*3/uL (ref 0.0–0.5)
Eosinophils Relative: 2 %
HCT: 37 % (ref 36.0–46.0)
Hemoglobin: 12 g/dL (ref 12.0–15.0)
Immature Granulocytes: 0 %
Lymphocytes Relative: 27 %
Lymphs Abs: 3.2 10*3/uL (ref 0.7–4.0)
MCH: 28 pg (ref 26.0–34.0)
MCHC: 32.4 g/dL (ref 30.0–36.0)
MCV: 86.4 fL (ref 80.0–100.0)
Monocytes Absolute: 0.6 10*3/uL (ref 0.1–1.0)
Monocytes Relative: 5 %
Neutro Abs: 7.8 10*3/uL — ABNORMAL HIGH (ref 1.7–7.7)
Neutrophils Relative %: 65 %
Platelets: 233 10*3/uL (ref 150–400)
RBC: 4.28 MIL/uL (ref 3.87–5.11)
RDW: 12.9 % (ref 11.5–15.5)
WBC: 11.9 10*3/uL — ABNORMAL HIGH (ref 4.0–10.5)
nRBC: 0 % (ref 0.0–0.2)

## 2023-04-06 LAB — COMPREHENSIVE METABOLIC PANEL
ALT: 18 U/L (ref 0–44)
AST: 20 U/L (ref 15–41)
Albumin: 3.5 g/dL (ref 3.5–5.0)
Alkaline Phosphatase: 46 U/L (ref 38–126)
Anion gap: 8 (ref 5–15)
BUN: <5 mg/dL — ABNORMAL LOW (ref 6–20)
CO2: 29 mmol/L (ref 22–32)
Calcium: 8.8 mg/dL — ABNORMAL LOW (ref 8.9–10.3)
Chloride: 100 mmol/L (ref 98–111)
Creatinine, Ser: 0.87 mg/dL (ref 0.44–1.00)
GFR, Estimated: >60 mL/min (ref >60)
Glucose, Bld: 112 mg/dL — ABNORMAL HIGH (ref 70–99)
Potassium: 3.2 mmol/L — ABNORMAL LOW (ref 3.5–5.1)
Sodium: 137 mmol/L (ref 135–145)
Total Bilirubin: 0.4 mg/dL (ref 0.3–1.2)
Total Protein: 7.5 g/dL (ref 6.5–8.1)

## 2023-04-06 LAB — MAGNESIUM: Magnesium: 1.8 mg/dL (ref 1.7–2.4)

## 2023-04-06 LAB — BRAIN NATRIURETIC PEPTIDE: B Natriuretic Peptide: 36.3 pg/mL (ref 0.0–100.0)

## 2023-04-06 MED ORDER — AMLODIPINE BESYLATE 5 MG PO TABS
5.0000 mg | ORAL_TABLET | Freq: Every day | ORAL | 0 refills | Status: AC
Start: 2023-04-06 — End: 2023-05-06
  Filled 2023-04-06: qty 30, 30d supply, fill #0

## 2023-04-06 MED ORDER — POTASSIUM CHLORIDE CRYS ER 10 MEQ PO TBCR
10.0000 meq | EXTENDED_RELEASE_TABLET | Freq: Every day | ORAL | 0 refills | Status: AC
  Filled 2023-04-06: qty 14, 14d supply, fill #0

## 2023-04-06 NOTE — Plan of Care (Signed)

## 2023-04-06 NOTE — Progress Notes (Signed)
Reviewed d/c instructions with pt and mother at bedside, piv d/c, d/c meds picked up at [facility] pharmacy. No concerns noted

## 2023-04-06 NOTE — Progress Notes (Signed)
IVC change in commitment form completed and sent to the magistrate via ecourts,  Envelope [id number]. No case number available as pt was IVC in the community. Original Change in Commitment form left on chart with patient label as TOC computer with EMR access is down.  Dellie Burns, MSW, LCSW [phone] (coverage)

## 2023-04-06 NOTE — Discharge Summary (Signed)
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 Sitter is a 32 y.o. F with HTN, obesity and polysubstance abuse who presented with hallucinations.     Hallucinations and confusion Medical work up with chest x-ray, CT head, comprehensive metabolic panel, CBC, BNP, urinalysis, and urine drug screen were all unremarkable except for urine screen positive for cocaine, amphetamines, and THC.  Patient evaluated by psychiatry, felt that she had substance induced psychosis  She was absent from amphetamine, cocaine, and THC in the hospital, and her psychosis resolved.  She was provided with resources from psychiatry for behavioral health follow-up, and psychiatry recommended initiating no medications while in the hospital.  Given hypertension, she was discharged on amlodipine, and given an appointment with a new primary care doctor.         The [facility] Controlled Substances Registry was reviewed for this patient prior to discharge.  Consultants: Psychiatry Procedures performed: CT head  Disposition: Home Diet recommendation:  Regular diet  DISCHARGE MEDICATION: Allergies as of 04/06/2023       Reactions   Hydrocodone Nausea And Vomiting   Bee Venom    Mango Flavor Hives        Medication List     STOP taking  these medications    potassium chloride 10 MEQ tablet Commonly known as: KLOR-CON Replaced by: potassium chloride 10 MEQ tablet       TAKE these medications    amLODipine 5 MG tablet Commonly known as: NORVASC Take 1 tablet (5 mg total) by mouth daily. What changed:  medication strength how much to take   cyanocobalamin 1000 MCG tablet Commonly known as: VITAMIN B12 Take 1,000 mcg by mouth daily.   Iron 28 MG Tabs Take 28 mg by mouth 3 (three) times daily.   potassium chloride 10 MEQ tablet Commonly known as: KLOR-CON M Take 1 tablet (10 mEq total) by mouth daily. Replaces: potassium chloride 10 MEQ tablet   SEMAGLUTIDE-WEIGHT MANAGEMENT [place] Inject 15 Units into the skin once a week.        Follow-up Information     [facility] Follow [facility] on 05/14/2023.   Specialty: Internal Medicine Why: Your appointment is at 1:40 pm. please arrive early and bring a picture ID. if have insurance bring insurance card. bring medications with you Contact information: [address] [place] [address] [phone]        [facility] Follow up.   Specialty: Urgent Care Contact information: [address] [place] [address] [phone]                Discharge Instructions     Discharge instructions   Complete by: As directed    **IMPORTANT DISCHARGE INSTRUCTIONS**   From Dr. Maryfrances Bunnell: You were admitted for hallucinations. You  were evaluated by psychiatry who recommended you present to the [facility] on [address] (see below in the To Do section)  You should continue your potassium and blood pressure medicine amlodipine  Go see your new primary care doctor at the appointment time listed below in the To Do section   Increase activity slowly   Complete by: As directed        Discharge Exam: Filed Weights   04/03/23 2017  Weight: 104.6 kg    General: Pt is alert,  awake, not in acute distress Cardiovascular: RRR, nl S1-S2, no murmurs appreciated.   No LE edema.   Respiratory: Normal respiratory rate and rhythm.  CTAB without rales or wheezes. Abdominal: Abdomen soft and non-tender.  No distension or HSM.   Neuro/Psych: Strength symmetric in upper and lower extremities.  Judgment and insight appear normal.   Condition at discharge: good  The results of significant diagnostics from this hospitalization (including imaging, microbiology, ancillary and laboratory) are listed below for reference.   Imaging Studies: CT Head Wo Contrast  Result Date: 04/03/2023 CLINICAL DATA:  Mental status change with unknown cause. EXAM: CT HEAD WITHOUT CONTRAST TECHNIQUE: Contiguous axial images were obtained from the base of the skull through the vertex without intravenous contrast. RADIATION DOSE REDUCTION: This exam was performed according to the departmental dose-optimization program which includes automated exposure control, adjustment of the mA and/or kV according to patient size and/or use of iterative reconstruction technique. COMPARISON:  03/30/2019 FINDINGS: Brain: Low-density appearance at the left cerebellum is best attributed to streak artifact given its band like shape and orientation on reformats. No evidence of acute infarct, hemorrhage, hydrocephalus, or collection. Vascular: No hyperdense vessel or unexpected calcification. Skull: Normal. Negative for fracture or focal lesion. Sinuses/Orbits: No acute finding. Other: Extensive artifact from patient's earrings. IMPRESSION: Degraded visualization from patient's earrings. No acute finding when allowing for associated streak artifact. Electronically Signed   By: Tiburcio Pea M.D.   On: 04/03/2023 15:37   DG Chest Port 1 View  Result Date: 04/03/2023 CLINICAL DATA:  altered mental status EXAM: PORTABLE CHEST 1 VIEW COMPARISON:  07/26/2022. FINDINGS: Low lung volume. Mild central pulmonary vascular congestion, most  likely accentuated by low lung volume. Bilateral lung fields are otherwise clear. Bilateral costophrenic angles are clear. Stable mildly enlarged cardio-mediastinal silhouette. No acute osseous abnormalities. The soft tissues are within normal limits. IMPRESSION: *Mild central pulmonary vascular congestion, most likely accentuated by low lung volume. Electronically Signed   By: Jules Schick M.D.   On: 04/03/2023 15:08    Microbiology: Results for orders placed or performed during the hospital encounter of 07/26/22  Resp Panel by RT-PCR (Flu A&B, Covid) Anterior Nasal Swab     Status: Abnormal   Collection Time: 07/26/22 10:20 AM   Specimen: Anterior Nasal Swab  Result Value Ref Range Status   SARS Coronavirus 2 by RT PCR NEGATIVE NEGATIVE Final    Comment: (NOTE) SARS-CoV-2 target nucleic acids are NOT DETECTED.  The SARS-CoV-2 RNA is generally detectable in upper respiratory specimens during the acute phase of infection. The lowest concentration of SARS-CoV-2 viral copies this assay can detect is 138 copies/mL. A negative result does not preclude SARS-Cov-2 infection and should not be used as the sole basis for treatment or other patient management decisions. A negative result may occur with  improper specimen collection/handling, submission of specimen other than nasopharyngeal swab, presence of viral mutation(s) within the areas targeted by this assay, and inadequate number of  viral copies(<138 copies/mL). A negative result must be combined with clinical observations, patient history, and epidemiological information. The expected result is Negative.  Fact Sheet for Patients:  BloggerCourse.com  Fact Sheet for Healthcare Providers:  SeriousBroker.it  This test is no t yet approved or cleared by the [place] FDA and  has been authorized for detection and/or diagnosis of SARS-CoV-2 by FDA under an Emergency Use Authorization  (EUA). This EUA will remain  in effect (meaning this test can be used) for the duration of the COVID-19 declaration under Section 564(b)(1) of the Act, 21 U.S.C.section 360bbb-3(b)(1), unless the authorization is terminated  or revoked sooner.       Influenza A by PCR POSITIVE (A) NEGATIVE Final   Influenza B by PCR NEGATIVE NEGATIVE Final    Comment: (NOTE) The Xpert Xpress SARS-CoV-2/FLU/RSV plus assay is intended as an aid in the diagnosis of influenza from Nasopharyngeal swab specimens and should not be used as a sole basis for treatment. Nasal washings and aspirates are unacceptable for Xpert Xpress SARS-CoV-2/FLU/RSV testing.  Fact Sheet for Patients: BloggerCourse.com  Fact Sheet for Healthcare Providers: SeriousBroker.it  This test is not yet approved or cleared by the [place] FDA and has been authorized for detection and/or diagnosis of SARS-CoV-2 by FDA under an Emergency Use Authorization (EUA). This EUA will remain in effect (meaning this test can be used) for the duration of the COVID-19 declaration under Section 564(b)(1) of the Act, 21 U.S.C. section 360bbb-3(b)(1), unless the authorization is terminated or revoked.  Performed at [facility], [address], [place], [place] [address]     Labs: CBC: Recent Labs  Lab 04/03/23 1222 04/03/23 1920 04/04/23 0128 04/05/23 0231 04/06/23 0149  WBC 21.8* 17.0* 14.9* 12.8* 11.9*  NEUTROABS 18.1*  --  9.1* 8.2* 7.8*  HGB 13.8 12.0 11.7* 11.5* 12.0  HCT 42.4 36.8 35.1* 35.2* 37.0  MCV 87.2 88.2 88.0 88.7 86.4  PLT 276 205 197 207 233   Basic Metabolic Panel: Recent Labs  Lab 04/03/23 1222 04/03/23 1920 04/04/23 0128 04/05/23 0231 04/06/23 0149  NA 141  --  140 138 137  K 2.6*  --  3.2* 3.0* 3.2*  CL 100  --  102 104 100  CO2 22  --  23 27 29   GLUCOSE 143*  --  90 108* 112*  BUN 15  --  13 8 <5*  CREATININE 2.19* 1.45* 1.21* 0.91 0.87   CALCIUM 10.0  --  8.5* 8.6* 8.8*  MG  --   --  1.8 1.6* 1.8   Liver Function Tests: Recent Labs  Lab 04/03/23 1222 04/04/23 0128 04/05/23 0231 04/06/23 0149  AST 30 20 20 20   ALT 20 15 19 18   ALKPHOS 62 44 61 46  BILITOT 1.0 0.6 0.2* 0.4  PROT 9.2* 6.9 6.7 7.5  ALBUMIN 4.8 3.5 3.2* 3.5   CBG: No results for input(s): "GLUCAP" in the last 168 hours.  Discharge time spent: approximately 35 minutes spent on discharge counseling, evaluation of patient on day of discharge, and coordination of discharge planning with nursing, social work, pharmacy and case management  Signed: Alberteen Sam, MD [facility] 04/06/2023

## 2023-05-14 ENCOUNTER — Ambulatory Visit: Payer: Self-pay | Admitting: Nurse Practitioner
